# Patient Record
Sex: Male | Born: 1945 | Race: Black or African American | Hispanic: No | Smoking: Current every day smoker
Health system: Southern US, Community
[De-identification: ages and names within clinical notes are randomized; demographics above are authoritative.]

## PROBLEM LIST (undated history)

## (undated) DIAGNOSIS — M545 Low back pain, unspecified: Secondary | ICD-10-CM

## (undated) DIAGNOSIS — N400 Enlarged prostate without lower urinary tract symptoms: Secondary | ICD-10-CM

## (undated) DIAGNOSIS — K635 Polyp of colon: Secondary | ICD-10-CM

## (undated) DIAGNOSIS — N529 Male erectile dysfunction, unspecified: Secondary | ICD-10-CM

## (undated) DIAGNOSIS — E119 Type 2 diabetes mellitus without complications: Secondary | ICD-10-CM

## (undated) DIAGNOSIS — G8929 Other chronic pain: Secondary | ICD-10-CM

## (undated) DIAGNOSIS — R809 Proteinuria, unspecified: Secondary | ICD-10-CM

## (undated) DIAGNOSIS — H269 Unspecified cataract: Secondary | ICD-10-CM

## (undated) DIAGNOSIS — K5790 Diverticulosis of intestine, part unspecified, without perforation or abscess without bleeding: Secondary | ICD-10-CM

## (undated) DIAGNOSIS — D472 Monoclonal gammopathy: Secondary | ICD-10-CM

## (undated) DIAGNOSIS — I10 Essential (primary) hypertension: Secondary | ICD-10-CM

## (undated) HISTORY — DX: Other chronic pain: G89.29

## (undated) HISTORY — DX: Male erectile dysfunction, unspecified: N52.9

## (undated) HISTORY — DX: Proteinuria, unspecified: R80.9

## (undated) HISTORY — DX: Low back pain: M54.5

## (undated) HISTORY — DX: Benign prostatic hyperplasia without lower urinary tract symptoms: N40.0

## (undated) HISTORY — DX: Essential (primary) hypertension: I10

## (undated) HISTORY — DX: Low back pain, unspecified: M54.50

## (undated) HISTORY — DX: Polyp of colon: K63.5

## (undated) HISTORY — DX: Unspecified cataract: H26.9

## (undated) HISTORY — DX: Type 2 diabetes mellitus without complications: E11.9

## (undated) HISTORY — PX: TRANSURETHRAL RESECTION OF PROSTATE: SHX73

## (undated) HISTORY — DX: Monoclonal gammopathy: D47.2

## (undated) HISTORY — DX: Diverticulosis of intestine, part unspecified, without perforation or abscess without bleeding: K57.90

---

## 1988-01-30 DIAGNOSIS — I10 Essential (primary) hypertension: Secondary | ICD-10-CM

## 1988-01-30 HISTORY — DX: Essential (primary) hypertension: I10

## 2004-03-08 ENCOUNTER — Encounter (INDEPENDENT_AMBULATORY_CARE_PROVIDER_SITE_OTHER): Payer: Self-pay | Admitting: *Deleted

## 2004-03-08 ENCOUNTER — Observation Stay (HOSPITAL_COMMUNITY): Admission: RE | Admit: 2004-03-08 | Discharge: 2004-03-09 | Payer: Self-pay | Admitting: Urology

## 2008-04-01 ENCOUNTER — Encounter (INDEPENDENT_AMBULATORY_CARE_PROVIDER_SITE_OTHER): Payer: Self-pay | Admitting: *Deleted

## 2008-04-29 DIAGNOSIS — D472 Monoclonal gammopathy: Secondary | ICD-10-CM

## 2008-04-29 HISTORY — DX: Monoclonal gammopathy: D47.2

## 2008-05-03 ENCOUNTER — Encounter: Payer: Self-pay | Admitting: Gastroenterology

## 2008-05-17 ENCOUNTER — Encounter: Payer: Self-pay | Admitting: Gastroenterology

## 2008-05-26 ENCOUNTER — Ambulatory Visit: Payer: Self-pay | Admitting: Gastroenterology

## 2008-06-01 ENCOUNTER — Ambulatory Visit: Payer: Self-pay | Admitting: Oncology

## 2008-06-10 ENCOUNTER — Encounter: Payer: Self-pay | Admitting: Gastroenterology

## 2008-06-10 ENCOUNTER — Ambulatory Visit: Payer: Self-pay | Admitting: Gastroenterology

## 2008-06-11 ENCOUNTER — Encounter: Payer: Self-pay | Admitting: Gastroenterology

## 2008-06-14 ENCOUNTER — Encounter: Payer: Self-pay | Admitting: Gastroenterology

## 2008-06-14 LAB — CBC WITH DIFFERENTIAL/PLATELET
BASO%: 0.6 % (ref 0.0–2.0)
EOS%: 0.8 % (ref 0.0–7.0)
MCH: 29.2 pg (ref 27.2–33.4)
MCHC: 33.1 g/dL (ref 32.0–36.0)
MONO#: 0.4 10*3/uL (ref 0.1–0.9)
NEUT%: 48.7 % (ref 39.0–75.0)
RBC: 5.21 10*6/uL (ref 4.20–5.82)
RDW: 12.8 % (ref 11.0–14.6)
WBC: 5.8 10*3/uL (ref 4.0–10.3)
lymph#: 2.5 10*3/uL (ref 0.9–3.3)

## 2008-06-14 LAB — COMPREHENSIVE METABOLIC PANEL
ALT: 36 U/L (ref 0–53)
AST: 42 U/L — ABNORMAL HIGH (ref 0–37)
Calcium: 9.2 mg/dL (ref 8.4–10.5)
Chloride: 102 mEq/L (ref 96–112)
Creatinine, Ser: 0.73 mg/dL (ref 0.40–1.50)
Potassium: 3.5 mEq/L (ref 3.5–5.3)
Sodium: 137 mEq/L (ref 135–145)
Total Protein: 8.3 g/dL (ref 6.0–8.3)

## 2008-06-16 LAB — IMMUNOFIXATION ELECTROPHORESIS: IgG (Immunoglobin G), Serum: 1730 mg/dL — ABNORMAL HIGH (ref 694–1618)

## 2008-06-16 LAB — KAPPA/LAMBDA LIGHT CHAINS
Kappa free light chain: 1.66 mg/dL (ref 0.33–1.94)
Kappa:Lambda Ratio: 0.8 (ref 0.26–1.65)

## 2008-06-16 LAB — BETA 2 MICROGLOBULIN, SERUM: Beta-2 Microglobulin: 2.25 mg/L — ABNORMAL HIGH (ref 1.01–1.73)

## 2008-06-18 LAB — CREATININE CLEARANCE, URINE, 24 HOUR
Collection Interval-CRCL: 24 hours
Creatinine, Urine: 144.3 mg/dL
Creatinine: 0.72 mg/dL (ref 0.40–1.50)

## 2008-06-18 LAB — UIFE/LIGHT CHAINS/TP QN, 24-HR UR
Albumin, U: DETECTED
Alpha 2, Urine: DETECTED — AB
Beta, Urine: DETECTED — AB
Total Protein, Urine-Ur/day: 168 mg/d — ABNORMAL HIGH (ref 10–140)

## 2008-07-01 ENCOUNTER — Ambulatory Visit: Payer: Self-pay | Admitting: Gastroenterology

## 2008-07-01 DIAGNOSIS — D126 Benign neoplasm of colon, unspecified: Secondary | ICD-10-CM

## 2008-08-11 ENCOUNTER — Ambulatory Visit: Payer: Self-pay | Admitting: Oncology

## 2008-08-13 ENCOUNTER — Telehealth: Payer: Self-pay | Admitting: Gastroenterology

## 2008-08-13 LAB — CBC WITH DIFFERENTIAL/PLATELET
Eosinophils Absolute: 0 10*3/uL (ref 0.0–0.5)
HCT: 43 % (ref 38.4–49.9)
LYMPH%: 45.5 % (ref 14.0–49.0)
MCHC: 33.7 g/dL (ref 32.0–36.0)
MCV: 87.2 fL (ref 79.3–98.0)
MONO#: 0.5 10*3/uL (ref 0.1–0.9)
NEUT%: 43.9 % (ref 39.0–75.0)
Platelets: 246 10*3/uL (ref 140–400)
WBC: 5.4 10*3/uL (ref 4.0–10.3)

## 2008-08-13 LAB — IGG, IGA, IGM
IgA: 304 mg/dL (ref 68–378)
IgG (Immunoglobin G), Serum: 1700 mg/dL — ABNORMAL HIGH (ref 694–1618)
IgM, Serum: 73 mg/dL (ref 60–263)

## 2008-08-13 LAB — COMPREHENSIVE METABOLIC PANEL
BUN: 13 mg/dL (ref 6–23)
CO2: 22 mEq/L (ref 19–32)
Calcium: 9.8 mg/dL (ref 8.4–10.5)
Chloride: 106 mEq/L (ref 96–112)
Creatinine, Ser: 0.69 mg/dL (ref 0.40–1.50)

## 2008-08-18 ENCOUNTER — Encounter: Payer: Self-pay | Admitting: Gastroenterology

## 2008-08-18 ENCOUNTER — Ambulatory Visit (HOSPITAL_COMMUNITY): Admission: RE | Admit: 2008-08-18 | Discharge: 2008-08-18 | Payer: Self-pay | Admitting: Gastroenterology

## 2008-08-20 ENCOUNTER — Encounter: Payer: Self-pay | Admitting: Gastroenterology

## 2008-08-23 ENCOUNTER — Ambulatory Visit: Payer: Self-pay | Admitting: Gastroenterology

## 2008-10-20 ENCOUNTER — Telehealth: Payer: Self-pay | Admitting: Gastroenterology

## 2008-11-02 ENCOUNTER — Ambulatory Visit: Payer: Self-pay | Admitting: Gastroenterology

## 2008-11-09 ENCOUNTER — Encounter: Payer: Self-pay | Admitting: Gastroenterology

## 2008-11-09 ENCOUNTER — Ambulatory Visit (HOSPITAL_COMMUNITY): Admission: RE | Admit: 2008-11-09 | Discharge: 2008-11-09 | Payer: Self-pay | Admitting: Gastroenterology

## 2008-11-09 ENCOUNTER — Ambulatory Visit: Payer: Self-pay | Admitting: Gastroenterology

## 2008-11-11 ENCOUNTER — Ambulatory Visit: Payer: Self-pay | Admitting: Oncology

## 2008-11-11 ENCOUNTER — Encounter: Payer: Self-pay | Admitting: Gastroenterology

## 2008-11-15 LAB — CBC WITH DIFFERENTIAL/PLATELET
BASO%: 0.7 % (ref 0.0–2.0)
Eosinophils Absolute: 0 10*3/uL (ref 0.0–0.5)
LYMPH%: 48.6 % (ref 14.0–49.0)
MCHC: 33.9 g/dL (ref 32.0–36.0)
MONO#: 0.4 10*3/uL (ref 0.1–0.9)
NEUT#: 1.6 10*3/uL (ref 1.5–6.5)
RBC: 4.8 10*6/uL (ref 4.20–5.82)
RDW: 12.9 % (ref 11.0–14.6)
WBC: 4 10*3/uL (ref 4.0–10.3)
lymph#: 2 10*3/uL (ref 0.9–3.3)

## 2008-11-15 LAB — COMPREHENSIVE METABOLIC PANEL
ALT: 17 U/L (ref 0–53)
AST: 20 U/L (ref 0–37)
Calcium: 8.8 mg/dL (ref 8.4–10.5)
Chloride: 104 mEq/L (ref 96–112)
Creatinine, Ser: 0.65 mg/dL (ref 0.40–1.50)
Potassium: 3.8 mEq/L (ref 3.5–5.3)
Sodium: 138 mEq/L (ref 135–145)
Total Protein: 7.6 g/dL (ref 6.0–8.3)

## 2008-11-15 LAB — LACTATE DEHYDROGENASE: LDH: 124 U/L (ref 94–250)

## 2009-03-16 ENCOUNTER — Ambulatory Visit: Payer: Self-pay | Admitting: Oncology

## 2009-03-18 LAB — CBC WITH DIFFERENTIAL/PLATELET
Basophils Absolute: 0 10*3/uL (ref 0.0–0.1)
Eosinophils Absolute: 0 10*3/uL (ref 0.0–0.5)
HCT: 43.7 % (ref 38.4–49.9)
HGB: 14.6 g/dL (ref 13.0–17.1)
LYMPH%: 52.3 % — ABNORMAL HIGH (ref 14.0–49.0)
MCHC: 33.4 g/dL (ref 32.0–36.0)
MONO#: 0.4 10*3/uL (ref 0.1–0.9)
NEUT#: 1.4 10*3/uL — ABNORMAL LOW (ref 1.5–6.5)
NEUT%: 35.5 % — ABNORMAL LOW (ref 39.0–75.0)
Platelets: 228 10*3/uL (ref 140–400)
WBC: 3.9 10*3/uL — ABNORMAL LOW (ref 4.0–10.3)
lymph#: 2.1 10*3/uL (ref 0.9–3.3)

## 2009-03-18 LAB — IGG, IGA, IGM
IgG (Immunoglobin G), Serum: 1580 mg/dL (ref 694–1618)
IgM, Serum: 80 mg/dL (ref 60–263)

## 2009-03-18 LAB — COMPREHENSIVE METABOLIC PANEL
BUN: 12 mg/dL (ref 6–23)
CO2: 27 mEq/L (ref 19–32)
Calcium: 9.1 mg/dL (ref 8.4–10.5)
Chloride: 104 mEq/L (ref 96–112)
Creatinine, Ser: 0.77 mg/dL (ref 0.40–1.50)
Glucose, Bld: 115 mg/dL — ABNORMAL HIGH (ref 70–99)
Total Bilirubin: 0.4 mg/dL (ref 0.3–1.2)

## 2009-03-18 LAB — LACTATE DEHYDROGENASE: LDH: 136 U/L (ref 94–250)

## 2009-09-23 ENCOUNTER — Ambulatory Visit: Payer: Self-pay | Admitting: Oncology

## 2009-09-27 LAB — CBC WITH DIFFERENTIAL/PLATELET
BASO%: 1 % (ref 0.0–2.0)
EOS%: 0.8 % (ref 0.0–7.0)
LYMPH%: 45 % (ref 14.0–49.0)
MCHC: 33.5 g/dL (ref 32.0–36.0)
MONO#: 0.4 10*3/uL (ref 0.1–0.9)
Platelets: 228 10*3/uL (ref 140–400)
RBC: 4.79 10*6/uL (ref 4.20–5.82)
WBC: 4.1 10*3/uL (ref 4.0–10.3)
lymph#: 1.9 10*3/uL (ref 0.9–3.3)

## 2009-09-28 LAB — COMPREHENSIVE METABOLIC PANEL
ALT: 29 U/L (ref 0–53)
AST: 32 U/L (ref 0–37)
Alkaline Phosphatase: 65 U/L (ref 39–117)
Calcium: 8.9 mg/dL (ref 8.4–10.5)
Chloride: 104 mEq/L (ref 96–112)
Creatinine, Ser: 0.7 mg/dL (ref 0.40–1.50)

## 2009-09-28 LAB — LACTATE DEHYDROGENASE: LDH: 153 U/L (ref 94–250)

## 2010-03-28 ENCOUNTER — Other Ambulatory Visit (HOSPITAL_COMMUNITY): Payer: Self-pay | Admitting: Oncology

## 2010-03-28 ENCOUNTER — Encounter (HOSPITAL_BASED_OUTPATIENT_CLINIC_OR_DEPARTMENT_OTHER): Payer: MEDICARE | Admitting: Oncology

## 2010-03-28 DIAGNOSIS — D472 Monoclonal gammopathy: Secondary | ICD-10-CM

## 2010-03-28 LAB — COMPREHENSIVE METABOLIC PANEL
ALT: 35 U/L (ref 0–53)
AST: 38 U/L — ABNORMAL HIGH (ref 0–37)
CO2: 22 mEq/L (ref 19–32)
Creatinine, Ser: 0.73 mg/dL (ref 0.40–1.50)
Sodium: 135 mEq/L (ref 135–145)
Total Bilirubin: 0.5 mg/dL (ref 0.3–1.2)
Total Protein: 7.6 g/dL (ref 6.0–8.3)

## 2010-03-28 LAB — CBC WITH DIFFERENTIAL/PLATELET
BASO%: 0.7 % (ref 0.0–2.0)
EOS%: 0.6 % (ref 0.0–7.0)
HCT: 42.3 % (ref 38.4–49.9)
LYMPH%: 46.9 % (ref 14.0–49.0)
MCH: 29.2 pg (ref 27.2–33.4)
MCHC: 33.7 g/dL (ref 32.0–36.0)
MONO#: 0.3 10*3/uL (ref 0.1–0.9)
NEUT%: 43.2 % (ref 39.0–75.0)
Platelets: 217 10*3/uL (ref 140–400)
RBC: 4.88 10*6/uL (ref 4.20–5.82)
WBC: 3.9 10*3/uL — ABNORMAL LOW (ref 4.0–10.3)
lymph#: 1.8 10*3/uL (ref 0.9–3.3)

## 2010-03-28 LAB — IGG, IGA, IGM: IgM, Serum: 68 mg/dL (ref 60–263)

## 2010-05-07 LAB — GLUCOSE, CAPILLARY: Glucose-Capillary: 122 mg/dL — ABNORMAL HIGH (ref 70–99)

## 2010-05-09 LAB — GLUCOSE, CAPILLARY
Glucose-Capillary: 112 mg/dL — ABNORMAL HIGH (ref 70–99)
Glucose-Capillary: 130 mg/dL — ABNORMAL HIGH (ref 70–99)

## 2010-06-16 NOTE — Op Note (Signed)
NAME:  Gregg Flores, Gregg Flores NO.:  [id number]   MEDICAL RECORD NO.:  [id number]          PATIENT TYPE:  OBV   LOCATION:  0355                         FACILITY:  [facility]   PHYSICIAN:  Valetta Fuller, M.D.  DATE OF BIRTH:  Jan 21, 1946   DATE OF PROCEDURE:  03/09/2004  DATE OF DISCHARGE:                                 OPERATIVE REPORT   PREOPERATIVE DIAGNOSES:  1.  Urinary retention.  2.  Benign prostatic hypertrophy and bladder neck obstruction.  3.  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   POSTOPERATIVE DIAGNOSES:  1.  Urinary retention.  2.  Benign prostatic hypertrophy and bladder neck obstruction.  3.  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   PROCEDURE PERFORMED:  Transurethral resection of the prostate and suprapubic  tube placement.   SURGEON:  Valetta Fuller, M.D.   ANESTHESIA:  General.   INDICATIONS:  Gregg Flores is a 65 year old male who presented approximately  two months ago with severe voiding symptoms and a previous episode of  urinary retention.  When we saw him, he was complaining of urination every  30-40 minutes. We felt that he probably had a overflow situation and indeed  an ultrasound suggested a 1500-2000 cc postvoid residual within his bladder.  The patient underwent a catheter placement with 1600 cc of clear urine.  Renal function turned out to be normal.  Subsequent renal ultrasound showed  no abnormalities. The patient was started on medical therapy and failed  voiding trial with a repeat urinary retention of over 1000 cc of urine. The  patient also had some urodynamics and he was unable to really generate a  strong detrusor contraction. Cystoscopy revealed some outlet obstruction. We  thought we were dealing potentially with longstanding outlet obstruction  with resulting decompensated atonic hypocontractile bladder. We discussed  several options with the patient. We felt that it would be not unreasonable  to consider TURP to try to  eliminate obstruction in hopes that he would  develop some recurrent bladder function and the ability to void. We would  also recommend concurrent suprapubic tube placed in case the patient was  still unable to urinate spontaneously. He appeared to understand the  advantages and disadvantages of this approach and full informed consent was  obtained.   DESCRIPTION OF PROCEDURE:  The patient was brought to the operating room  where he had successful induction of general anesthesia. He was given  systemic antibiotic therapy due to his chronic indwelling Foley catheter.  He was placed in lithotomy position and prepped, draped in the usual manner.  The continuous flow 28-French resectoscope was inserted. The prostate was  actually a little bit larger and more obstructing than and I had appreciated  with flexible cystoscopy in the office. The bladder cell showed some chronic  inflammation consistent with a catheter but otherwise appeared relatively  unremarkable. The patient appeared have a 40-50 grams prostate with a  moderate trilobar hyperplasia. We started his resection by taking down the  bladder neck to capsular fibers. We took down the lateral lobes from  anterior to posteriorly, left lobe then right lobe. There was again  considerably more tissue that I had originally appreciated. We resected  lateral lobes down to capsular fibers in the bladder neck region but he  still had some mid and apical tissue. At that point we had resected for  about 50-55 minutes and I felt that we needed to complete the resection. At  that point I would estimate that 30-40 grams of prostate had been resected.  The bladder neck area appeared wide open, although there still was some  apical and mid prostatic tissues. We felt that we had indeed eliminated the  obvious outlet obstruction, although it is possible he may need a staged  TURP if he continues not to void successfully and there is ongoing evidence  of  visual obstruction. The chips were all evacuated. Hemostasis was then  accomplished with the electrocautery and was really quite acceptable at the  completion of the procedure. I went ahead and used a Lowsley retractor to  place a suprapubic tube at the dome of the bladder after completion of the  TURP. This was then confirmed to be in good position with cystoscopy. The  suprapubic tube was anchored and a Foley catheter was placed for continuous  irrigation. The patient was brought to recovery room in stable condition.  His urine was actually crystal-clear that time.      DSG/MEDQ  D:  03/09/2004  T:  03/09/2004  Job:  [id number]

## 2010-09-28 ENCOUNTER — Encounter (HOSPITAL_BASED_OUTPATIENT_CLINIC_OR_DEPARTMENT_OTHER): Payer: Medicare Other | Admitting: Oncology

## 2010-09-28 ENCOUNTER — Other Ambulatory Visit (HOSPITAL_COMMUNITY): Payer: Self-pay | Admitting: Oncology

## 2010-09-28 DIAGNOSIS — D472 Monoclonal gammopathy: Secondary | ICD-10-CM

## 2010-09-28 LAB — CBC WITH DIFFERENTIAL/PLATELET
BASO%: 0.5 % (ref 0.0–2.0)
LYMPH%: 47 % (ref 14.0–49.0)
MCH: 29.3 pg (ref 27.2–33.4)
MCHC: 33.8 g/dL (ref 32.0–36.0)
MCV: 86.7 fL (ref 79.3–98.0)
MONO%: 10.5 % (ref 0.0–14.0)
Platelets: 244 10*3/uL (ref 140–400)
RBC: 5.17 10*6/uL (ref 4.20–5.82)

## 2010-09-29 LAB — COMPREHENSIVE METABOLIC PANEL
Albumin: 4.5 g/dL (ref 3.5–5.2)
BUN: 18 mg/dL (ref 6–23)
Calcium: 10.3 mg/dL (ref 8.4–10.5)
Chloride: 106 mEq/L (ref 96–112)
Creatinine, Ser: 0.88 mg/dL (ref 0.50–1.35)
Glucose, Bld: 116 mg/dL — ABNORMAL HIGH (ref 70–99)
Potassium: 4.3 mEq/L (ref 3.5–5.3)

## 2010-09-29 LAB — IGG, IGA, IGM
IgA: 316 mg/dL (ref 68–379)
IgG (Immunoglobin G), Serum: 1600 mg/dL (ref 650–1600)
IgM, Serum: 72 mg/dL (ref 41–251)

## 2010-11-17 LAB — HM DIABETES EYE EXAM

## 2010-12-19 LAB — HEPATIC FUNCTION PANEL
AST: 37 U/L (ref 14–40)
Alkaline Phosphatase: 41 U/L (ref 25–125)
Bilirubin, Total: 0.6 mg/dL

## 2010-12-19 LAB — BASIC METABOLIC PANEL
BUN: 17 mg/dL (ref 4–21)
Creatinine: 0.9 mg/dL (ref 0.6–1.3)
Potassium: 4.1 mmol/L (ref 3.4–5.3)

## 2010-12-19 LAB — LIPID PANEL: LDL Cholesterol: 92 mg/dL

## 2011-02-08 ENCOUNTER — Telehealth: Payer: Self-pay | Admitting: Oncology

## 2011-02-08 NOTE — Telephone Encounter (Signed)
appt info mailed to pt    aom

## 2011-02-14 ENCOUNTER — Encounter: Payer: Self-pay | Admitting: Physician Assistant

## 2011-02-14 DIAGNOSIS — N138 Other obstructive and reflux uropathy: Secondary | ICD-10-CM | POA: Insufficient documentation

## 2011-02-14 DIAGNOSIS — N5201 Erectile dysfunction due to arterial insufficiency: Secondary | ICD-10-CM | POA: Insufficient documentation

## 2011-02-14 DIAGNOSIS — K5790 Diverticulosis of intestine, part unspecified, without perforation or abscess without bleeding: Secondary | ICD-10-CM | POA: Insufficient documentation

## 2011-02-14 DIAGNOSIS — I10 Essential (primary) hypertension: Secondary | ICD-10-CM | POA: Insufficient documentation

## 2011-02-14 DIAGNOSIS — R809 Proteinuria, unspecified: Secondary | ICD-10-CM | POA: Insufficient documentation

## 2011-02-14 DIAGNOSIS — E1129 Type 2 diabetes mellitus with other diabetic kidney complication: Secondary | ICD-10-CM | POA: Insufficient documentation

## 2011-02-14 DIAGNOSIS — M545 Low back pain: Secondary | ICD-10-CM | POA: Insufficient documentation

## 2011-02-14 DIAGNOSIS — N401 Enlarged prostate with lower urinary tract symptoms: Secondary | ICD-10-CM | POA: Insufficient documentation

## 2011-02-14 DIAGNOSIS — D472 Monoclonal gammopathy: Secondary | ICD-10-CM | POA: Insufficient documentation

## 2011-02-14 DIAGNOSIS — H269 Unspecified cataract: Secondary | ICD-10-CM | POA: Insufficient documentation

## 2011-03-27 ENCOUNTER — Ambulatory Visit (INDEPENDENT_AMBULATORY_CARE_PROVIDER_SITE_OTHER): Payer: Medicare Other | Admitting: Physician Assistant

## 2011-03-27 ENCOUNTER — Encounter: Payer: Self-pay | Admitting: Physician Assistant

## 2011-03-27 DIAGNOSIS — I10 Essential (primary) hypertension: Secondary | ICD-10-CM

## 2011-03-27 DIAGNOSIS — R809 Proteinuria, unspecified: Secondary | ICD-10-CM

## 2011-03-27 DIAGNOSIS — E782 Mixed hyperlipidemia: Secondary | ICD-10-CM

## 2011-03-27 DIAGNOSIS — E119 Type 2 diabetes mellitus without complications: Secondary | ICD-10-CM

## 2011-03-27 DIAGNOSIS — Z79899 Other long term (current) drug therapy: Secondary | ICD-10-CM

## 2011-03-27 LAB — COMPREHENSIVE METABOLIC PANEL
AST: 43 U/L — ABNORMAL HIGH (ref 0–37)
Alkaline Phosphatase: 49 U/L (ref 39–117)
BUN: 19 mg/dL (ref 6–23)
Creat: 0.92 mg/dL (ref 0.50–1.35)
Total Bilirubin: 0.5 mg/dL (ref 0.3–1.2)

## 2011-03-27 LAB — LIPID PANEL
HDL: 26 mg/dL — ABNORMAL LOW (ref >39)
LDL Cholesterol: 83 mg/dL (ref 0–99)
Total CHOL/HDL Ratio: 5 Ratio
VLDL: 20 mg/dL (ref 0–40)

## 2011-03-27 MED ORDER — HYDROCHLOROTHIAZIDE 25 MG PO TABS: 25.0000 mg | ORAL_TABLET | ORAL | Status: DC

## 2011-03-27 MED ORDER — PRAVASTATIN SODIUM 40 MG PO TABS: 40.0000 mg | ORAL_TABLET | Freq: Every evening | ORAL | Status: DC

## 2011-03-27 MED ORDER — METFORMIN HCL 1000 MG PO TABS: 1000.0000 mg | ORAL_TABLET | Freq: Two times a day (BID) | ORAL | Status: DC

## 2011-03-27 MED ORDER — FENOFIBRATE 160 MG PO TABS: 160.0000 mg | ORAL_TABLET | Freq: Every day | ORAL | Status: DC

## 2011-03-27 NOTE — Assessment & Plan Note (Signed)
Controlled. Continue current treatment. 

## 2011-03-27 NOTE — Assessment & Plan Note (Signed)
Controlled.  Continue working on healthy eating and regular exercise.  Continue current treatment.

## 2011-03-27 NOTE — Progress Notes (Signed)
Subjective:    Patient ID: Gregg Flores, male    DOB: [birth year], 66 y.o.   MRN: [id number]  HPI Presents for Follow-up of DM type 2, HTN, and hyperlipidemia.  Feels good!  Sees DDS regularly-dentures-full set Sees eye doctor annually  Rare home glucose monitoring No hypoglycemic events-feels hungry as glucose gets low   Review of Systems No chest pain, SOB, HA, dizziness, vision change, N/V, diarrhea, dysuria, myalgias, arthralgias or rash.     Objective:   Physical Exam  Constitutional: He is oriented to person, place, and time. Vital signs are normal. He appears well-developed and well-nourished. No distress.  HENT:  Head: Normocephalic and atraumatic.  Right Ear: Hearing normal.  Left Ear: Hearing normal.  Eyes: EOM are normal. Pupils are equal, round, and reactive to light.  Neck: Normal range of motion. Neck supple. No thyromegaly present.  Cardiovascular: Normal rate, regular rhythm and normal heart sounds.   Pulses:      Radial pulses are 2+ on the right side, and 2+ on the left side.       Dorsalis pedis pulses are 2+ on the right side, and 2+ on the left side.       Posterior tibial pulses are 2+ on the right side, and 2+ on the left side.  Pulmonary/Chest: Effort normal and breath sounds normal.  Lymphadenopathy:       Head (right side): No tonsillar, no preauricular, no posterior auricular and no occipital adenopathy present.       Head (left side): No tonsillar, no preauricular, no posterior auricular and no occipital adenopathy present.    He has no cervical adenopathy.       Right: No supraclavicular adenopathy present.       Left: No supraclavicular adenopathy present.  Neurological: He is alert and oriented to person, place, and time. No sensory deficit.  Skin: Skin is warm, dry and intact. No rash noted. No cyanosis or erythema. Nails show no clubbing.  Psychiatric: He has a normal mood and affect.   See diabetic foot exam.  Results for orders placed  in visit on 03/27/11  COMPREHENSIVE METABOLIC PANEL      Component Value Range   Sodium 138  135 - 145 (mEq/L)   Potassium 4.4  3.5 - 5.3 (mEq/L)   Chloride 105  96 - 112 (mEq/L)   CO2 23  19 - 32 (mEq/L)   Glucose, Bld 111 (*) 70 - 99 (mg/dL)   BUN 19  6 - 23 (mg/dL)   Creat 6.96  2.95 - 2.84 (mg/dL)   Total Bilirubin 0.5  0.3 - 1.2 (mg/dL)   Alkaline Phosphatase 49  39 - 117 (U/L)   AST 43 (*) 0 - 37 (U/L)   ALT 35  0 - 53 (U/L)   Total Protein 7.7  6.0 - 8.3 (g/dL)   Albumin 4.4  3.5 - 5.2 (g/dL)   Calcium 9.8  8.4 - 13.2 (mg/dL)  LIPID PANEL      Component Value Range   Cholesterol 129  0 - 200 (mg/dL)   Triglycerides 440  <102 (mg/dL)   HDL 26 (*) >72 (mg/dL)   Total CHOL/HDL Ratio 5.0     VLDL 20  0 - 40 (mg/dL)   LDL Cholesterol 83  0 - 99 (mg/dL)  POCT GLYCOSYLATED HEMOGLOBIN (HGB A1C)      Component Value Range   Hemoglobin A1C 6.8    POCT CBG (FASTING - GLUCOSE)-MANUAL ENTRY  Component Value Range   Glucose Fasting, POC 122 (*) 70 - 99 (mg/dL)   CMET, lipids pending.     Assessment & Plan:

## 2011-03-27 NOTE — Patient Instructions (Signed)
Continue to see your eye doctor annually and your dentist twice each year. Continue working on making healthy eating choices and getting regular exercise.

## 2011-03-27 NOTE — Assessment & Plan Note (Signed)
Continue to efforts to maintain good BP and glucose control.

## 2011-03-28 ENCOUNTER — Encounter: Payer: Self-pay | Admitting: Physician Assistant

## 2011-03-29 ENCOUNTER — Other Ambulatory Visit (HOSPITAL_BASED_OUTPATIENT_CLINIC_OR_DEPARTMENT_OTHER): Payer: Medicare Other | Admitting: Lab

## 2011-03-29 DIAGNOSIS — D472 Monoclonal gammopathy: Secondary | ICD-10-CM

## 2011-03-29 LAB — CBC WITH DIFFERENTIAL/PLATELET
Basophils Absolute: 0.1 10*3/uL (ref 0.0–0.1)
Eosinophils Absolute: 0.1 10*3/uL (ref 0.0–0.5)
HCT: 42.3 % (ref 38.4–49.9)
LYMPH%: 48.5 % (ref 14.0–49.0)
MCV: 83.8 fL (ref 79.3–98.0)
MONO#: 0.4 10*3/uL (ref 0.1–0.9)
NEUT#: 2.1 10*3/uL (ref 1.5–6.5)
NEUT%: 41.7 % (ref 39.0–75.0)
Platelets: 230 10*3/uL (ref 140–400)
WBC: 5 10*3/uL (ref 4.0–10.3)

## 2011-03-30 ENCOUNTER — Encounter: Payer: Self-pay | Admitting: Physician Assistant

## 2011-03-30 LAB — COMPREHENSIVE METABOLIC PANEL
BUN: 14 mg/dL (ref 6–23)
CO2: 21 mEq/L (ref 19–32)
Creatinine, Ser: 1.27 mg/dL (ref 0.50–1.35)
Glucose, Bld: 107 mg/dL — ABNORMAL HIGH (ref 70–99)
Total Bilirubin: 0.4 mg/dL (ref 0.3–1.2)
Total Protein: 7.5 g/dL (ref 6.0–8.3)

## 2011-03-30 LAB — LACTATE DEHYDROGENASE: LDH: 137 U/L (ref 94–250)

## 2011-03-30 LAB — IGG, IGA, IGM
IgA: 351 mg/dL (ref 68–379)
IgG (Immunoglobin G), Serum: 2030 mg/dL — ABNORMAL HIGH (ref 650–1600)

## 2011-06-19 ENCOUNTER — Ambulatory Visit (INDEPENDENT_AMBULATORY_CARE_PROVIDER_SITE_OTHER): Payer: Medicare Other | Admitting: Physician Assistant

## 2011-06-19 ENCOUNTER — Encounter: Payer: Self-pay | Admitting: Physician Assistant

## 2011-06-19 VITALS — BP 142/82 | HR 56 | Temp 97.9°F | Resp 16 | Ht 69.5 in | Wt 237.8 lb

## 2011-06-19 DIAGNOSIS — R809 Proteinuria, unspecified: Secondary | ICD-10-CM

## 2011-06-19 DIAGNOSIS — E785 Hyperlipidemia, unspecified: Secondary | ICD-10-CM

## 2011-06-19 DIAGNOSIS — I10 Essential (primary) hypertension: Secondary | ICD-10-CM

## 2011-06-19 DIAGNOSIS — J309 Allergic rhinitis, unspecified: Secondary | ICD-10-CM

## 2011-06-19 DIAGNOSIS — E119 Type 2 diabetes mellitus without complications: Secondary | ICD-10-CM

## 2011-06-19 LAB — POCT GLYCOSYLATED HEMOGLOBIN (HGB A1C): Hemoglobin A1C: 6.9

## 2011-06-19 LAB — COMPREHENSIVE METABOLIC PANEL
BUN: 19 mg/dL (ref 6–23)
CO2: 23 mEq/L (ref 19–32)
Calcium: 9.3 mg/dL (ref 8.4–10.5)
Chloride: 107 mEq/L (ref 96–112)
Creat: 1.02 mg/dL (ref 0.50–1.35)
Total Bilirubin: 0.5 mg/dL (ref 0.3–1.2)

## 2011-06-19 MED ORDER — IPRATROPIUM BROMIDE 0.03 % NA SOLN: 2.0000 | Freq: Two times a day (BID) | NASAL | Status: DC

## 2011-06-19 MED ORDER — METFORMIN HCL 1000 MG PO TABS: 1000.0000 mg | ORAL_TABLET | Freq: Two times a day (BID) | ORAL | Status: DC

## 2011-06-19 MED ORDER — FENOFIBRATE 160 MG PO TABS: 160.0000 mg | ORAL_TABLET | Freq: Every day | ORAL | Status: DC

## 2011-06-19 MED ORDER — PRAVASTATIN SODIUM 40 MG PO TABS: 40.0000 mg | ORAL_TABLET | Freq: Every evening | ORAL | Status: DC

## 2011-06-19 MED ORDER — AMLODIPINE BESYLATE 10 MG PO TABS: 10.0000 mg | ORAL_TABLET | Freq: Every day | ORAL | Status: DC

## 2011-06-19 MED ORDER — BENAZEPRIL HCL 40 MG PO TABS: 40.0000 mg | ORAL_TABLET | Freq: Every day | ORAL | Status: DC

## 2011-06-19 MED ORDER — HYDROCHLOROTHIAZIDE 25 MG PO TABS: 25.0000 mg | ORAL_TABLET | ORAL | Status: DC

## 2011-06-19 NOTE — Progress Notes (Signed)
  Subjective:    Patient ID: Gregg Flores, male    DOB: February 08, 1945, 66 y.o.   MRN: [id number]  HPI  Presents for routine follow-up of HTN and DM type 2.  Not checking home glucose.  Dizziness with trying to quit smoking, so he continues.    Last ophthalmology visit 09/2010. Last dental visit some time ago.  Fully endentulous. Is in the process of scheduling follow-up for a new set of dentures.  Review of Systems No chest pain, SOB, HA, dizziness, vision change, N/V, diarrhea, dysuria, myalgias or rash. Left knee pain intermittently.     Objective:   Physical Exam  Vital signs noted. Well-developed, well nourished BM who is awake, alert and oriented, in NAD. HEENT: [place]/AT, PERRL, EOMI.  Sclera and conjunctiva are clear.  EAC are patent, TMs are normal in appearance. Nasal mucosa is pink and moist. OP is clear. Neck: supple, non-tender, no lymphadenopathy, thyromegaly. Heart: RRR, no murmur Lungs: CTA Abdomen: normo-active bowel sounds, supple, non-tender, no mass or organomegaly. Extremities: no cyanosis, clubbing or edema. Skin: warm and dry without rash.  Results for orders placed in visit on 06/19/11  GLUCOSE, POCT (MANUAL RESULT ENTRY)      Component Value Range   POC Glucose 131 (*) 70 - 99 (mg/dl)  POCT GLYCOSYLATED HEMOGLOBIN (HGB A1C)      Component Value Range   Hemoglobin A1C 6.9          Assessment & Plan:   1. Type II or unspecified type diabetes mellitus without mention of complication, not stated as uncontrolled  POCT glucose (manual entry), POCT glycosylated hemoglobin (Hb A1C), Comprehensive metabolic panel, metFORMIN (GLUCOPHAGE) 1000 MG tablet  2. HTN (hypertension)  Comprehensive metabolic panel, amLODipine (NORVASC) 10 MG tablet, benazepril (LOTENSIN) 40 MG tablet, hydrochlorothiazide (HYDRODIURIL) 25 MG tablet  3. Microalbuminuria  Microalbumin, urine  4. AR (allergic rhinitis)  ipratropium (ATROVENT) 0.03 % nasal spray  5. Hyperlipidemia   fenofibrate 160 MG tablet, pravastatin (PRAVACHOL) 40 MG tablet   Patient Instructions  Keep up the good work on quitting smoking! And keep up the good work with healthy eating and exercise!  If you knee starts bothering you more, we'll do some additional evaluation.   Re-evaluate in 3 months.

## 2011-06-19 NOTE — Patient Instructions (Addendum)
Keep up the good work on quitting smoking! And keep up the good work with healthy eating and exercise!  If you knee starts bothering you more, we'll do some additional evaluation.

## 2011-06-20 LAB — MICROALBUMIN, URINE: Microalb, Ur: 1.58 mg/dL (ref 0.00–1.89)

## 2011-06-21 ENCOUNTER — Encounter: Payer: Self-pay | Admitting: Physician Assistant

## 2011-09-25 ENCOUNTER — Ambulatory Visit (INDEPENDENT_AMBULATORY_CARE_PROVIDER_SITE_OTHER): Payer: Medicare Other | Admitting: Physician Assistant

## 2011-09-25 ENCOUNTER — Encounter: Payer: Self-pay | Admitting: Physician Assistant

## 2011-09-25 VITALS — BP 127/84 | HR 59 | Temp 97.8°F | Resp 16 | Ht 69.0 in | Wt 236.0 lb

## 2011-09-25 DIAGNOSIS — D472 Monoclonal gammopathy: Secondary | ICD-10-CM

## 2011-09-25 DIAGNOSIS — R809 Proteinuria, unspecified: Secondary | ICD-10-CM

## 2011-09-25 DIAGNOSIS — E782 Mixed hyperlipidemia: Secondary | ICD-10-CM

## 2011-09-25 DIAGNOSIS — I10 Essential (primary) hypertension: Secondary | ICD-10-CM

## 2011-09-25 DIAGNOSIS — E785 Hyperlipidemia, unspecified: Secondary | ICD-10-CM

## 2011-09-25 DIAGNOSIS — J309 Allergic rhinitis, unspecified: Secondary | ICD-10-CM

## 2011-09-25 DIAGNOSIS — Z79899 Other long term (current) drug therapy: Secondary | ICD-10-CM

## 2011-09-25 DIAGNOSIS — Z720 Tobacco use: Secondary | ICD-10-CM | POA: Insufficient documentation

## 2011-09-25 DIAGNOSIS — N400 Enlarged prostate without lower urinary tract symptoms: Secondary | ICD-10-CM

## 2011-09-25 DIAGNOSIS — E119 Type 2 diabetes mellitus without complications: Secondary | ICD-10-CM

## 2011-09-25 LAB — LIPID PANEL
Cholesterol: 153 mg/dL (ref 0–200)
HDL: 30 mg/dL — ABNORMAL LOW (ref >39)
Total CHOL/HDL Ratio: 5.1 Ratio
Triglycerides: 108 mg/dL (ref <150)

## 2011-09-25 LAB — COMPREHENSIVE METABOLIC PANEL
AST: 36 U/L (ref 0–37)
BUN: 18 mg/dL (ref 6–23)
Calcium: 9.6 mg/dL (ref 8.4–10.5)
Chloride: 104 mEq/L (ref 96–112)
Creat: 0.87 mg/dL (ref 0.50–1.35)
Glucose, Bld: 123 mg/dL — ABNORMAL HIGH (ref 70–99)

## 2011-09-25 LAB — GLUCOSE, POCT (MANUAL RESULT ENTRY): POC Glucose: 129 mg/dl — AB (ref 70–99)

## 2011-09-25 MED ORDER — METFORMIN HCL 1000 MG PO TABS: 1000.0000 mg | ORAL_TABLET | Freq: Two times a day (BID) | ORAL | Status: DC

## 2011-09-25 MED ORDER — BENAZEPRIL HCL 40 MG PO TABS: 40.0000 mg | ORAL_TABLET | Freq: Every day | ORAL | Status: DC

## 2011-09-25 MED ORDER — IPRATROPIUM BROMIDE 0.03 % NA SOLN: 2.0000 | Freq: Two times a day (BID) | NASAL | Status: DC

## 2011-09-25 MED ORDER — ASPIRIN 81 MG PO TABS: 81.0000 mg | ORAL_TABLET | Freq: Every day | ORAL | Status: DC

## 2011-09-25 MED ORDER — PRAVASTATIN SODIUM 40 MG PO TABS: 40.0000 mg | ORAL_TABLET | Freq: Every evening | ORAL | Status: DC

## 2011-09-25 MED ORDER — FENOFIBRATE 160 MG PO TABS: 160.0000 mg | ORAL_TABLET | Freq: Every day | ORAL | Status: DC

## 2011-09-25 MED ORDER — HYDROCHLOROTHIAZIDE 25 MG PO TABS: 25.0000 mg | ORAL_TABLET | ORAL | Status: DC

## 2011-09-25 MED ORDER — AMLODIPINE BESYLATE 10 MG PO TABS: 10.0000 mg | ORAL_TABLET | Freq: Every day | ORAL | Status: DC

## 2011-09-25 NOTE — Assessment & Plan Note (Signed)
Follow-up with Dr. Arline Asp this week as scheduled.

## 2011-09-25 NOTE — Progress Notes (Signed)
Subjective:    Patient ID: JEREMIAS BROYHILL, male    DOB: 04-18-1945, 66 y.o.   MRN: [id number]  HPI 66 y.o. year old male presents for evaluation of diabetes type 2.  Prior to Admission medications   Medication Sig Start Date End Date Taking? Authorizing Provider  amLODipine (NORVASC) 10 MG tablet Take 1 tablet (10 mg total) by mouth daily. 06/19/11  Yes Nyela Cortinas S Zephaniah Enyeart, PA-C  aspirin 81 MG tablet Take 81 mg by mouth daily.   Yes Historical Provider, MD  benazepril (LOTENSIN) 40 MG tablet Take 1 tablet (40 mg total) by mouth daily. 06/19/11  Yes Shawnique Mariotti S Masson Nalepa, PA-C  fenofibrate 160 MG tablet Take 1 tablet (160 mg total) by mouth daily. 06/19/11  Yes Kariana Wiles S Rhena Glace, PA-C  hydrochlorothiazide (HYDRODIURIL) 25 MG tablet Take 1 tablet (25 mg total) by mouth every morning. 06/19/11  Yes Hakim Minniefield S Loralei Radcliffe, PA-C  ipratropium (ATROVENT) 0.03 % nasal spray Place 2 sprays into the nose every 12 (twelve) hours. 06/19/11  Yes Kycen Spalla S Sharelle Burditt, PA-C  metFORMIN (GLUCOPHAGE) 1000 MG tablet Take 1 tablet (1,000 mg total) by mouth 2 (two) times daily with a meal. 06/19/11  Yes Daylen Lipsky S Chrisandra Wiemers, PA-C  pravastatin (PRAVACHOL) 40 MG tablet Take 1 tablet (40 mg total) by mouth every evening. 06/19/11  Yes Dariusz Brase S Maymie Brunke, PA-C    No Known Allergies  Checks home glucose occasionally. Post-prandial glucose 180 about 2 weeks ago. does experience symptoms with hypoglycemia. ("my stomach feels bad.") does perform daily foot exam. Last dental visit was about a year ago (edentuluous, uncompensated) Last eye exam was 10/2010.  Has appointment 10/2011. has received a pneumococcal vaccine. is current with influenza vaccination.  Past Medical History  Diagnosis Date  . Essential hypertension, benign 1990  . Chronic LBP   . Type II or unspecified type diabetes mellitus without mention of complication, not stated as uncontrolled   . Diverticulosis   . Colon polyps   . BPH (benign prostatic hyperplasia)   .  Monoclonal gammopathy 04/2008  . Cataracts, bilateral   . ED (erectile dysfunction)   . Microalbuminuria     Family History  Problem Relation Age of Onset  . Heart disease Mother     s/p CABG  . Hypertension Mother    Review of Systems Denies chest pain, shortness of breath, HA, dizziness, vision change, nausea, vomiting, diarrhea, constipation, melena, hematochezia, dysuria, increased urinary urgency or frequency, increased hunger or thirst, unintentional weight change, unexplained myalgias or arthralgias, rash.  Has been coughing, mostly in the morning.  Continues to work on smoking cessation, "Just about" quit.  "It's hard to do."    Objective:   Physical Exam  Blood pressure 127/84, pulse 59, temperature 97.8 F (36.6 C), temperature source Oral, resp. rate 16, height 5\' 9"  (1.753 m), weight 236 lb (107.049 kg), SpO2 98.00%. Body mass index is 34.85 kg/(m^2). Well-developed, well nourished BM who is awake, alert and oriented, in NAD. HEENT: [place]/AT, sclera and conjunctiva are clear.  Edentulous-uncompensated Neck: supple, non-tender, no lymphadenopathy, thyromegaly. Heart: RRR, no murmur Lungs: normal effort, CTA Extremities: no cyanosis, clubbing or edema. Skin: warm and dry without rash. See diabetic foot exam.  Results for orders placed in visit on 09/25/11  GLUCOSE, POCT (MANUAL RESULT ENTRY)      Component Value Range   POC Glucose 129 (*) 70 - 99 mg/dl  POCT GLYCOSYLATED HEMOGLOBIN (HGB A1C)      Component Value Range   Hemoglobin A1C 7.0  Assessment & Plan:  Follow-up in 3 months, CPE/AWV-S at that time.

## 2011-09-25 NOTE — Assessment & Plan Note (Signed)
Update urine microalbumin today.

## 2011-09-25 NOTE — Assessment & Plan Note (Signed)
Congratulated on cutting back thus far.  Encouraged to continue these efforts for cessation.

## 2011-09-25 NOTE — Assessment & Plan Note (Signed)
A1C 7.0% today.  Continue efforts for healthy eating and regular exercise.  Re-check 3 months.

## 2011-09-25 NOTE — Patient Instructions (Addendum)
Keep working on quitting smoking! Keep working on getting regular exercise and making healthy eating choices to keep your blood sugar in control!

## 2011-09-25 NOTE — Assessment & Plan Note (Signed)
Controlled on current regimen.  Recheck 3 months.

## 2011-09-26 ENCOUNTER — Encounter: Payer: Self-pay | Admitting: Physician Assistant

## 2011-09-26 LAB — MICROALBUMIN, URINE: Microalb, Ur: 2.22 mg/dL — ABNORMAL HIGH (ref 0.00–1.89)

## 2011-09-27 ENCOUNTER — Ambulatory Visit (HOSPITAL_BASED_OUTPATIENT_CLINIC_OR_DEPARTMENT_OTHER): Payer: Medicare Other | Admitting: Lab

## 2011-09-27 ENCOUNTER — Telehealth: Payer: Self-pay | Admitting: Oncology

## 2011-09-27 ENCOUNTER — Ambulatory Visit (HOSPITAL_BASED_OUTPATIENT_CLINIC_OR_DEPARTMENT_OTHER): Payer: Medicare Other | Admitting: Oncology

## 2011-09-27 ENCOUNTER — Encounter: Payer: Self-pay | Admitting: Oncology

## 2011-09-27 ENCOUNTER — Other Ambulatory Visit: Payer: Self-pay

## 2011-09-27 VITALS — BP 140/81 | HR 65 | Temp 97.6°F | Resp 18 | Ht 69.0 in | Wt 239.5 lb

## 2011-09-27 DIAGNOSIS — D472 Monoclonal gammopathy: Secondary | ICD-10-CM

## 2011-09-27 LAB — CBC WITH DIFFERENTIAL/PLATELET
BASO%: 0.8 % (ref 0.0–2.0)
EOS%: 1.5 % (ref 0.0–7.0)
LYMPH%: 45.2 % (ref 14.0–49.0)
MCH: 28.8 pg (ref 27.2–33.4)
MCHC: 34.6 g/dL (ref 32.0–36.0)
MCV: 83.1 fL (ref 79.3–98.0)
MONO%: 8.1 % (ref 0.0–14.0)
Platelets: 223 10*3/uL (ref 140–400)
RBC: 4.97 10*6/uL (ref 4.20–5.82)

## 2011-09-27 LAB — COMPREHENSIVE METABOLIC PANEL (CC13)
ALT: 34 U/L (ref 0–55)
AST: 42 U/L — ABNORMAL HIGH (ref 5–34)
Albumin: 3.9 g/dL (ref 3.5–5.0)
Alkaline Phosphatase: 63 U/L (ref 40–150)
Calcium: 10.3 mg/dL (ref 8.4–10.4)
Chloride: 104 mEq/L (ref 98–107)
Potassium: 4.1 mEq/L (ref 3.5–5.1)
Sodium: 140 mEq/L (ref 136–145)

## 2011-09-27 LAB — IGG, IGA, IGM: IgM, Serum: 60 mg/dL (ref 41–251)

## 2011-09-27 NOTE — Progress Notes (Signed)
This office note has been dictated.  #[id number]

## 2011-09-27 NOTE — Telephone Encounter (Signed)
gve the pt his march,aug 2014 appt calendar

## 2011-09-27 NOTE — Progress Notes (Signed)
CC:   Jonita Albee, M.D. Barbette Hair. Arlyce Dice, MD,FACG  PROBLEM LIST: 1. 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  24-hour urine collection in 2010 yielded 168 mg of     protein.  Urine immunofixation electrophoresis was equivocal.     Initial IgG level was 1730 with normal IgA and IgM.  The patient     has not had a metastatic bone survey or a bone marrow exam. 2. Hypertension. 3. Dyslipidemia. 4. Diabetes mellitus. 5. BPH status post TURP on 03/09/2004. 6. Colonic polyps with tubulovillous histology on the patient's     colonoscopy of 06/10/2008.  MEDICATIONS: 1. Norvasc 10 mg daily. 2. Aspirin 81 mg daily. 3. Lotensin 40 mg daily. 4. Fenofibrate 160 mg daily. 5. Hydrochlorothiazide 25 mg daily. 6. Atrovent nasal spray 2 sprays every 12 hours. 7. Metformin 1000 mg twice daily. 8. Pravachol 40 mg in the evening.  SMOKING HISTORY:  The patient has smoked a pack of cigarettes a day since age 66.  He is currently trying to cut down and stop smoking.  HISTORY:  Gregg Flores was seen today for followup of his IgG kappa monoclonal gammopathy first detected in April 2010.  The patient's condition remains stable.  He has had some chronic low back pain but I sense that this is not a problem for him.  He really denies any significant mental or medical issues over the last year.  He was last seen by [place] on 09/28/2010 and before that on 03/28/2010.  He has not had any surgery or hospitalizations.  He is due for colonoscopy and I have urged him to follow through with this.  Dr. Arlyce Dice had done his colonoscopy in the past.  The patient is on Medicare.  He likes to fish. He is retired.  PHYSICAL EXAMINATION:  General:  Gregg Flores is pleasant somewhat overweight gentleman weighing at 239.5 pounds, height 5 feet 9 inches, body surface area 2.3 sq m.  Vital Signs:  Blood pressure 140/81.  Other vital signs are normal.  HEENT:  There is no scleral  icterus.  Mouth and pharynx are benign.  He does have conjunctival injection.  No adenopathy palpable.  Heart/lungs:  Normal.  No skeletal tenderness or deformity. Abdomen:  Obese, nontender, with no organomegaly or masses palpable. Extremities:  No peripheral edema or clubbing.  Neurologic:  Normal.  LABORATORY DATA:  Today, white count 4.8, ANC 2.1, hemoglobin 14.3, hematocrit 41.3, platelets 223,000.  Chemistries today were normal except for a glucose of 142 and an AST of 42.  Total protein 7.7 and albumin 3.9.  Globulins were therefore 3.8.  Quantitative immunoglobulins are pending.  On 03/29/2011 the IgG level was 2030 which is increased from the normal range of 650 to 1600.  IgA and IgM were normal.  On 09/28/2010 IgG level was 1600 and on 09/27/2009 was 1750.  IMAGING STUDIES:  Chest x-ray, 2 view from 03/03/2004, showed no active disease.  The visualized skeleton was unremarkable.  PROCEDURES:  The patient's most recent colonoscopy was on 11/09/2008. He has a history of adenomatous colonic polyps and, in fact, had a sessile polyp in the ascending colon that was removed with biopsy showing a tubulovillous polyp.  IMPRESSION AND PLAN:  Gregg Flores continues to do well.  He will be turning this 66 on September 5th.  I have urged him to follow through with plans for colonoscopy.  He assured me that he will do this.  I have  also encouraged him to stop smoking.  The patient is making a conscious effort in this direction.  We will check quantitative immunoglobulins in 6 months and plan to see Gregg Flores again in 1 year at which time we will check CBC, chemistries, and quantitative immunoglobulins.  If it appears that his IgG level is increasing then we may want to do some additional studies which might include urine collection and immunofixation electrophoresis, metastatic bone survey, and bone marrow exam.    ______________________________ Samul Dada, M.D. DSM/MEDQ   D:  09/27/2011  T:  09/27/2011  Job:  [id number]

## 2011-11-21 ENCOUNTER — Encounter: Payer: Self-pay | Admitting: Gastroenterology

## 2011-12-19 ENCOUNTER — Encounter: Payer: Self-pay | Admitting: Physician Assistant

## 2012-01-01 ENCOUNTER — Encounter: Payer: Self-pay | Admitting: Physician Assistant

## 2012-01-01 ENCOUNTER — Ambulatory Visit (INDEPENDENT_AMBULATORY_CARE_PROVIDER_SITE_OTHER): Payer: Medicare Other | Admitting: Physician Assistant

## 2012-01-01 VITALS — BP 134/80 | HR 70 | Temp 98.3°F | Resp 16 | Ht 70.0 in | Wt 234.0 lb

## 2012-01-01 DIAGNOSIS — F17200 Nicotine dependence, unspecified, uncomplicated: Secondary | ICD-10-CM

## 2012-01-01 DIAGNOSIS — Z720 Tobacco use: Secondary | ICD-10-CM

## 2012-01-01 DIAGNOSIS — E785 Hyperlipidemia, unspecified: Secondary | ICD-10-CM

## 2012-01-01 DIAGNOSIS — J309 Allergic rhinitis, unspecified: Secondary | ICD-10-CM

## 2012-01-01 DIAGNOSIS — I10 Essential (primary) hypertension: Secondary | ICD-10-CM

## 2012-01-01 DIAGNOSIS — E119 Type 2 diabetes mellitus without complications: Secondary | ICD-10-CM

## 2012-01-01 DIAGNOSIS — R809 Proteinuria, unspecified: Secondary | ICD-10-CM

## 2012-01-01 LAB — COMPREHENSIVE METABOLIC PANEL
Albumin: 4.6 g/dL (ref 3.5–5.2)
Alkaline Phosphatase: 49 U/L (ref 39–117)
CO2: 23 mEq/L (ref 19–32)
Glucose, Bld: 112 mg/dL — ABNORMAL HIGH (ref 70–99)
Potassium: 4.4 mEq/L (ref 3.5–5.3)
Sodium: 138 mEq/L (ref 135–145)
Total Protein: 7.8 g/dL (ref 6.0–8.3)

## 2012-01-01 LAB — LIPID PANEL
LDL Cholesterol: 80 mg/dL (ref 0–99)
Triglycerides: 117 mg/dL (ref <150)

## 2012-01-01 MED ORDER — METFORMIN HCL 1000 MG PO TABS: 1000.0000 mg | ORAL_TABLET | Freq: Two times a day (BID) | ORAL | Status: DC

## 2012-01-01 MED ORDER — AMLODIPINE BESYLATE 10 MG PO TABS: 10.0000 mg | ORAL_TABLET | Freq: Every day | ORAL | Status: DC

## 2012-01-01 MED ORDER — HYDROCHLOROTHIAZIDE 25 MG PO TABS: 25.0000 mg | ORAL_TABLET | ORAL | Status: DC

## 2012-01-01 MED ORDER — PRAVASTATIN SODIUM 40 MG PO TABS: 40.0000 mg | ORAL_TABLET | Freq: Every evening | ORAL | Status: DC

## 2012-01-01 MED ORDER — IPRATROPIUM BROMIDE 0.03 % NA SOLN: 2.0000 | Freq: Two times a day (BID) | NASAL | Status: DC

## 2012-01-01 MED ORDER — FENOFIBRATE 160 MG PO TABS: 160.0000 mg | ORAL_TABLET | Freq: Every day | ORAL | Status: DC

## 2012-01-01 MED ORDER — BENAZEPRIL HCL 40 MG PO TABS: 40.0000 mg | ORAL_TABLET | Freq: Every day | ORAL | Status: DC

## 2012-01-01 NOTE — Progress Notes (Signed)
Subjective:    Patient ID: Gregg Flores, male    DOB: 1945/09/01, 66 y.o.   MRN: [id number]  HPI This 66 y.o. male presents for evaluation of:  Marland Kitchen Essential hypertension, benign  . Chronic LBP  . Type II or unspecified type diabetes mellitus without mention of complication, not stated as uncontrolled  . Diverticulosis  . BPH (benign prostatic hyperplasia)  . Monoclonal gammopathy  . Cataracts, bilateral  . ED (erectile dysfunction)  . Microalbuminuria  . History of tobacco abuse (He quit smoking since his last visit!)   Checks home glucose occasionally.  Fasting glucose 120, post-prandial glucose 200 about 3 weeks ago.  does experience symptoms with hypoglycemia. ("my stomach feels bad.")  does perform daily foot exam.  Last dental visit was about a year ago (edentuluous, compensated)  Last eye exam was 10/2011.  Is current with pneumococcal vaccine.  is current with influenza vaccination.  Past Medical History  Diagnosis Date  . Essential hypertension, benign 1990  . Chronic LBP   . Type II or unspecified type diabetes mellitus without mention of complication, not stated as uncontrolled   . Diverticulosis   . Colon polyps   . BPH (benign prostatic hyperplasia)   . Monoclonal gammopathy 04/2008  . Cataracts, bilateral   . ED (erectile dysfunction)   . Microalbuminuria     Past Surgical History  Procedure Date  . Transurethral resection of prostate     Prior to Admission medications   Medication Sig Start Date End Date Taking? Authorizing Provider  amLODipine (NORVASC) 10 MG tablet Take 1 tablet (10 mg total) by mouth daily. 09/25/11  Yes Charvez Voorhies S Kamyah Wilhelmsen, PA-C  aspirin 81 MG tablet Take 1 tablet (81 mg total) by mouth daily. 09/25/11  Yes Tessica Cupo S Tristin Gladman, PA-C  benazepril (LOTENSIN) 40 MG tablet Take 1 tablet (40 mg total) by mouth daily. 09/25/11  Yes Niranjan Rufener S Alekzander Cardell, PA-C  fenofibrate 160 MG tablet Take 1 tablet (160 mg total) by mouth daily. 09/25/11  Yes Lyvia Mondesir  S Darrel Gloss, PA-C  hydrochlorothiazide (HYDRODIURIL) 25 MG tablet Take 1 tablet (25 mg total) by mouth every morning. 09/25/11  Yes Sharisse Rantz S Krishiv Sandler, PA-C  ipratropium (ATROVENT) 0.03 % nasal spray Place 2 sprays into the nose every 12 (twelve) hours. 09/25/11  Yes Raley Novicki S Henny Strauch, PA-C  metFORMIN (GLUCOPHAGE) 1000 MG tablet Take 1 tablet (1,000 mg total) by mouth 2 (two) times daily with a meal. 09/25/11  Yes Earnesteen Birnie S Doloris Servantes, PA-C  pravastatin (PRAVACHOL) 40 MG tablet Take 1 tablet (40 mg total) by mouth every evening. 09/25/11  Yes Shaunita Seney S Masiyah Jorstad, PA-C    No Known Allergies  History   Social History  . Marital Status: Single    Spouse Name: N/A    Number of Children: N/A  . Years of Education: N/A   Occupational History  . Not on file.   Social History Main Topics  . Smoking status: Former Smoker -- 0.5 packs/day for 43 years    Types: Cigarettes    Quit date: 09/24/2011  . Smokeless tobacco: Never Used  . Alcohol Use: 0.0 - 0.5 oz/week    0-1 drink(s) per week     Comment: occasionally, wine  . Drug Use: No  . Sexually Active: Not on file   Family History  Problem Relation Age of Onset  . Heart disease Mother     s/p CABG  . Hypertension Mother     Review of Systems Denies chest pain, shortness of breath,  HA, dizziness, vision change, nausea, vomiting, diarrhea, constipation, melena, hematochezia, dysuria, increased urinary urgency or frequency, increased hunger or thirst, unintentional weight change, unexplained myalgias or arthralgias, rash.     Objective:   Physical Exam  Blood pressure 134/80, pulse 70, temperature 98.3 F (36.8 C), resp. rate 16, height 5\' 10"  (1.778 m), weight 234 lb (106.142 kg). Body mass index is 33.58 kg/(m^2). Well-developed, well nourished BM who is awake, alert and oriented, in NAD. HEENT: [place]/AT, PERRL, EOMI.  Sclera and conjunctiva are clear.  EAC are patent, TMs are normal in appearance. Nasal mucosa is pink and moist. OP is clear  (fully compensated edentula). Neck: supple, non-tender, no lymphadenopathy, thyromegaly. Heart: RRR, no murmur Lungs: normal effort, CTA Extremities: no cyanosis, clubbing or edema. See diabetic foot exam. Skin: warm and dry without rash. Psychologic: good mood and appropriate affect, normal speech and behavior.  Results for orders placed in visit on 01/01/12  GLUCOSE, POCT (MANUAL RESULT ENTRY)      Component Value Range   POC Glucose 127 (*) 70 - 99 mg/dl  POCT GLYCOSYLATED HEMOGLOBIN (HGB A1C)      Component Value Range   Hemoglobin A1C 6.9    COMPREHENSIVE METABOLIC PANEL      Component Value Range   Sodium 138  135 - 145 mEq/L   Potassium 4.4  3.5 - 5.3 mEq/L   Chloride 105  96 - 112 mEq/L   CO2 23  19 - 32 mEq/L   Glucose, Bld 112 (*) 70 - 99 mg/dL   BUN 17  6 - 23 mg/dL   Creat 1.61  0.96 - 0.45 mg/dL   Total Bilirubin 0.6  0.3 - 1.2 mg/dL   Alkaline Phosphatase 49  39 - 117 U/L   AST 46 (*) 0 - 37 U/L   ALT 38  0 - 53 U/L   Total Protein 7.8  6.0 - 8.3 g/dL   Albumin 4.6  3.5 - 5.2 g/dL   Calcium 9.8  8.4 - 40.9 mg/dL  LIPID PANEL      Component Value Range   Cholesterol 131  0 - 200 mg/dL   Triglycerides 811  <914 mg/dL   HDL 28 (*) >78 mg/dL   Total CHOL/HDL Ratio 4.7     VLDL 23  0 - 40 mg/dL   LDL Cholesterol 80  0 - 99 mg/dL      Assessment & Plan:   1. Tobacco abuse  He QUIT! Congratulated and encouraged.  2. Type II or unspecified type diabetes mellitus without mention of complication, not stated as uncontrolled  POCT glucose (manual entry), POCT glycosylated hemoglobin (Hb A1C), Comprehensive metabolic panel, Lipid panel, Microalbumin, urine, metFORMIN (GLUCOPHAGE) 1000 MG tablet  3. Essential hypertension, benign  Controlled  4. Microalbuminuria  Microalbumin, urine  5. Hyperlipidemia  pravastatin (PRAVACHOL) 40 MG tablet, fenofibrate 160 MG tablet  6. AR (allergic rhinitis)  ipratropium (ATROVENT) 0.03 % nasal spray  7. HTN (hypertension)   hydrochlorothiazide (HYDRODIURIL) 25 MG tablet, benazepril (LOTENSIN) 40 MG tablet, amLODipine (NORVASC) 10 MG tablet   Continue current treatment.  RTC 3 months.

## 2012-01-04 ENCOUNTER — Encounter: Payer: Self-pay | Admitting: Physician Assistant

## 2012-03-31 ENCOUNTER — Other Ambulatory Visit (HOSPITAL_BASED_OUTPATIENT_CLINIC_OR_DEPARTMENT_OTHER): Payer: Medicare Other | Admitting: Lab

## 2012-03-31 DIAGNOSIS — D472 Monoclonal gammopathy: Secondary | ICD-10-CM

## 2012-03-31 LAB — IGG, IGA, IGM: IgG (Immunoglobin G), Serum: 1500 mg/dL (ref 650–1600)

## 2012-04-06 ENCOUNTER — Encounter: Payer: Self-pay | Admitting: Physician Assistant

## 2012-04-06 ENCOUNTER — Ambulatory Visit: Payer: Medicare Other

## 2012-04-06 ENCOUNTER — Ambulatory Visit (INDEPENDENT_AMBULATORY_CARE_PROVIDER_SITE_OTHER): Payer: Medicare Other | Admitting: Physician Assistant

## 2012-04-06 VITALS — BP 140/82 | HR 84 | Temp 98.2°F | Resp 18 | Ht 70.0 in | Wt 243.8 lb

## 2012-04-06 DIAGNOSIS — M25579 Pain in unspecified ankle and joints of unspecified foot: Secondary | ICD-10-CM

## 2012-04-06 DIAGNOSIS — E785 Hyperlipidemia, unspecified: Secondary | ICD-10-CM

## 2012-04-06 DIAGNOSIS — E119 Type 2 diabetes mellitus without complications: Secondary | ICD-10-CM

## 2012-04-06 MED ORDER — AMLODIPINE BESYLATE 10 MG PO TABS: 10.0000 mg | ORAL_TABLET | Freq: Every day | ORAL | Status: DC

## 2012-04-06 MED ORDER — BENAZEPRIL HCL 40 MG PO TABS: 40.0000 mg | ORAL_TABLET | Freq: Every day | ORAL | Status: DC

## 2012-04-06 MED ORDER — MELOXICAM 15 MG PO TABS: 15.0000 mg | ORAL_TABLET | Freq: Every day | ORAL | Status: DC

## 2012-04-06 MED ORDER — HYDROCHLOROTHIAZIDE 25 MG PO TABS: 25.0000 mg | ORAL_TABLET | ORAL | Status: DC

## 2012-04-06 MED ORDER — METFORMIN HCL 1000 MG PO TABS: 1000.0000 mg | ORAL_TABLET | Freq: Two times a day (BID) | ORAL | Status: DC

## 2012-04-06 MED ORDER — IPRATROPIUM BROMIDE 0.03 % NA SOLN: 2.0000 | Freq: Two times a day (BID) | NASAL | Status: DC

## 2012-04-06 MED ORDER — PRAVASTATIN SODIUM 40 MG PO TABS: 40.0000 mg | ORAL_TABLET | Freq: Every evening | ORAL | Status: DC

## 2012-04-06 MED ORDER — FENOFIBRATE 160 MG PO TABS: 160.0000 mg | ORAL_TABLET | Freq: Every day | ORAL | Status: DC

## 2012-04-06 MED ORDER — HYDROCODONE-ACETAMINOPHEN 5-325 MG PO TABS: 1.0000 | ORAL_TABLET | Freq: Every evening | ORAL | Status: DC | PRN

## 2012-04-06 NOTE — Progress Notes (Signed)
Subjective:    Patient ID: Gregg Flores, male    DOB: [birth year], 67 y.o.   MRN: [id number]  HPI  This 67 y.o. male presents for evaluation of LEFT ankle pain, and routine follow-up of his chronic medical problems. His ankle pain began 3-4 days ago after wearing a pair of shoes that he doesn't typically wear.  No specific trauma or injury.  Pain with weight bearing and walking. No swelling.  Aleve helped a little.  Patient Active Problem List  Diagnosis  . BENIGN NEOPLASM OF COLON  . Essential hypertension, benign  . Chronic LBP  . Type II or unspecified type diabetes mellitus without mention of complication, not stated as uncontrolled  . Diverticulosis  . BPH (benign prostatic hyperplasia)  . Monoclonal gammopathy  . Cataracts, bilateral  . ED (erectile dysfunction)  . Microalbuminuria  . History of tobacco abuse  . Hyperlipidemia LDL goal <70   Frequency of home glucose monitoring: once weekly; fasting 120's-130's Sees an eye specialist annually. Doesn't see a dentist, he is edentulous. Checks feet daily. Is current with influenza vaccine. Is current with pneumococcal vaccine.   Past Medical History  Diagnosis Date  . Essential hypertension, benign 1990  . Chronic LBP   . Type II or unspecified type diabetes mellitus without mention of complication, not stated as uncontrolled   . Diverticulosis   . Colon polyps   . BPH (benign prostatic hyperplasia)   . Monoclonal gammopathy 04/2008  . Cataracts, bilateral   . ED (erectile dysfunction)   . Microalbuminuria     Past Surgical History  Procedure Laterality Date  . Transurethral resection of prostate      Prior to Admission medications   Medication Sig Start Date End Date Taking? Authorizing Provider  amLODipine (NORVASC) 10 MG tablet Take 1 tablet (10 mg total) by mouth daily. 01/01/12  Yes Chelle S Jeffery, PA-C  aspirin 81 MG tablet Take 1 tablet (81 mg total) by mouth daily. 09/25/11  Yes Chelle S Jeffery, PA-C   benazepril (LOTENSIN) 40 MG tablet Take 1 tablet (40 mg total) by mouth daily. 01/01/12  Yes Chelle S Jeffery, PA-C  fenofibrate 160 MG tablet Take 1 tablet (160 mg total) by mouth daily. 01/01/12  Yes Chelle S Jeffery, PA-C  hydrochlorothiazide (HYDRODIURIL) 25 MG tablet Take 1 tablet (25 mg total) by mouth every morning. 01/01/12  Yes Chelle S Jeffery, PA-C  ipratropium (ATROVENT) 0.03 % nasal spray Place 2 sprays into the nose every 12 (twelve) hours. 01/01/12  Yes Chelle S Jeffery, PA-C  metFORMIN (GLUCOPHAGE) 1000 MG tablet Take 1 tablet (1,000 mg total) by mouth 2 (two) times daily with a meal. 01/01/12  Yes Chelle S Jeffery, PA-C  pravastatin (PRAVACHOL) 40 MG tablet Take 1 tablet (40 mg total) by mouth every evening. 01/01/12  Yes Chelle S Jeffery, PA-C    No Known Allergies  History   Social History  . Marital Status: Single    Spouse Name: N/A    Number of Children: N/A  . Years of Education: N/A   Occupational History  . Not on file.   Social History Main Topics  . Smoking status: Former Smoker -- 0.50 packs/day for 43 years    Types: Cigarettes    Quit date: 09/24/2011  . Smokeless tobacco: Never Used  . Alcohol Use: 0 - .5 oz/week    0-1 drink(s) per week     Comment: occasionally, wine  . Drug Use: No  . Sexually Active: Not on  file   Other Topics Concern  . Not on file   Social History Narrative  . No narrative on file    Family History  Problem Relation Age of Onset  . Heart disease Mother     s/p CABG  . Hypertension Mother     Review of Systems As above. Denies chest pain, shortness of breath, HA, dizziness, vision change, nausea, vomiting, diarrhea, constipation, melena, hematochezia, dysuria, increased urinary urgency or frequency, increased hunger or thirst, unintentional weight change, rash.     Objective:   Physical Exam Blood pressure 140/82, pulse 84, temperature 98.2 F (36.8 C), temperature source Oral, resp. rate 18, height 5\' 10"  (1.778  m), weight 243 lb 12.8 oz (110.587 kg), SpO2 98.00%. Body mass index is 34.98 kg/(m^2). Well-developed, well nourished BM who is awake, alert and oriented, in NAD. HEENT: [place]/AT, PERRL, EOMI.  Sclera and conjunctiva are clear.  EAC are patent, TMs are normal in appearance. Nasal mucosa is pink and moist. OP is clear. Neck: supple, non-tender, no lymphadenopathy, thyromegaly. Heart: RRR, no murmur Lungs: normal effort, CTA Abdomen: normo-active bowel sounds, supple, non-tender, no mass or organomegaly. Extremities: no cyanosis, clubbing or edema. Tenderness with deep palpation of the lateral malleolus and up the distal fibula several inches. Skin: warm and dry without rash. See DM foot exam. Psychologic: good mood and appropriate affect, normal speech and behavior.    Results for orders placed in visit on 04/06/12  GLUCOSE, POCT (MANUAL RESULT ENTRY)      Result Value Range   POC Glucose 148 (*) 70 - 99 mg/dl  POCT GLYCOSYLATED HEMOGLOBIN (HGB A1C)      Result Value Range   Hemoglobin A1C 7.3     LEFT ankle: [facility] reading (PRIMARY) by  Dr. Merla Riches.  Normal ankle.  No evidence of gout, fracture, other acute boney deformity.  Evidence of old injury of the mortis medially. Calcaneal spur.      Assessment & Plan:  Pain in joint, ankle and foot, left - Plan: DG Ankle Complete Left, meloxicam (MOBIC) 15 MG tablet, HYDROcodone-acetaminophen (NORCO) 5-325 MG per tablet  Type II or unspecified type diabetes mellitus without mention of complication, not stated as uncontrolled - Plan: POCT glucose (manual entry), POCT glycosylated hemoglobin (Hb A1C), Comprehensive metabolic panel, metFORMIN (GLUCOPHAGE) 1000 MG tablet, For home use only DME Glucometer  Microalbuminuria - Plan: Microalbumin, urine  Benign neoplasm of colon - Plan: Ambulatory referral to Gastroenterology  HTN (hypertension) - Plan: amLODipine (NORVASC) 10 MG tablet, benazepril (LOTENSIN) 40 MG tablet, hydrochlorothiazide  (HYDRODIURIL) 25 MG tablet  Hyperlipidemia - Plan: Lipid panel, fenofibrate 160 MG tablet, pravastatin (PRAVACHOL) 40 MG tablet  AR (allergic rhinitis) - Plan: ipratropium (ATROVENT) 0.03 % nasal spray  Need for hepatitis C screening test - Plan: Hepatitis C antibody

## 2012-04-06 NOTE — Patient Instructions (Addendum)
Use the meloxicam for pain.  Do not take Aleve or ibuprofen while you take this.  You MAY take acetaminophen (Tylenol) OR hydrocodone/APAP (Norco) with the meloxicam if needed for pain. If the pain continues, return for additional evaluation.  Otherwise, I'll see you in 3 months.

## 2012-04-07 ENCOUNTER — Encounter: Payer: Self-pay | Admitting: Gastroenterology

## 2012-04-07 LAB — COMPREHENSIVE METABOLIC PANEL
Albumin: 5 g/dL (ref 3.5–5.2)
BUN: 19 mg/dL (ref 6–23)
Calcium: 10.4 mg/dL (ref 8.4–10.5)
Chloride: 103 mEq/L (ref 96–112)
Glucose, Bld: 157 mg/dL — ABNORMAL HIGH (ref 70–99)
Potassium: 4.3 mEq/L (ref 3.5–5.3)
Total Protein: 8.5 g/dL — ABNORMAL HIGH (ref 6.0–8.3)

## 2012-04-07 LAB — LIPID PANEL
HDL: 36 mg/dL — ABNORMAL LOW (ref >39)
Triglycerides: 111 mg/dL (ref <150)

## 2012-04-07 NOTE — Progress Notes (Signed)
04/08/12 appt cancelled. Three month appt made for 6/3 at 7:45 with pt. Gregg Flores

## 2012-04-08 ENCOUNTER — Ambulatory Visit: Payer: Medicare Other | Admitting: Physician Assistant

## 2012-04-09 ENCOUNTER — Encounter: Payer: Self-pay | Admitting: Physician Assistant

## 2012-04-09 ENCOUNTER — Ambulatory Visit (INDEPENDENT_AMBULATORY_CARE_PROVIDER_SITE_OTHER): Payer: Medicare Other | Admitting: Emergency Medicine

## 2012-04-09 VITALS — BP 140/80 | HR 77 | Temp 98.0°F | Resp 16 | Ht 70.0 in | Wt 245.0 lb

## 2012-04-09 DIAGNOSIS — D472 Monoclonal gammopathy: Secondary | ICD-10-CM

## 2012-04-09 DIAGNOSIS — B1920 Unspecified viral hepatitis C without hepatic coma: Secondary | ICD-10-CM

## 2012-04-09 DIAGNOSIS — M25569 Pain in unspecified knee: Secondary | ICD-10-CM

## 2012-04-09 MED ORDER — CYCLOBENZAPRINE HCL 10 MG PO TABS: 10.0000 mg | ORAL_TABLET | Freq: Three times a day (TID) | ORAL | Status: DC | PRN

## 2012-04-09 NOTE — Patient Instructions (Signed)
You may only take ONE meloxicam each day.   You may take TWO of the Norco (hydrocodone) every 4-6 hours as needed for pain. You may take the cyclobenzaprine (Flexeril) every 8 hours, but it may cause sleepiness, so don't drive when you take it. Keep the leg elevated as much as you can. Limit the walking and standing you do.

## 2012-04-09 NOTE — Progress Notes (Signed)
Subjective:    Patient ID: Gregg Flores, male    DOB: 02-21-1945, 67 y.o.   MRN: [id number]  HPI  This 67 y.o. male presents for evaluation of LEFT leg pain.  Seen initially for this on 04/06/2012, when the pain was located in the lateral malleolar region.  Xrays were negative for boney deformities.  The pain is now higher up the leg, no longer in the ankle regional.  No swelling. Recall he has had no trauma/injury to the area.  Has taken 2 meloxicam and 2 Norco at a time, since one of each was ineffective. Rest and elevation helps.  Standing and walking exacerbate the pain.  He has controlled DM type 2, HTN, hyperlipidemia and a longstanding monoclonal gammopathy. His recent screening for hepatitis C was positive, and he needs additional PCR testing to confirm.  Past Medical History  Diagnosis Date  . Essential hypertension, benign 1990  . Chronic LBP   . Type II or unspecified type diabetes mellitus without mention of complication, not stated as uncontrolled   . Diverticulosis   . Colon polyps   . BPH (benign prostatic hyperplasia)   . Monoclonal gammopathy 04/2008  . Cataracts, bilateral   . ED (erectile dysfunction)   . Microalbuminuria     Past Surgical History  Procedure Laterality Date  . Transurethral resection of prostate      Prior to Admission medications   Medication Sig Start Date End Date Taking? Authorizing Provider  amLODipine (NORVASC) 10 MG tablet Take 1 tablet (10 mg total) by mouth daily. 04/06/12  Yes Rochelle Nephew S Lola Lofaro, PA-C  aspirin 81 MG tablet Take 1 tablet (81 mg total) by mouth daily. 09/25/11  Yes Miryam Mcelhinney S Lemont Sitzmann, PA-C  benazepril (LOTENSIN) 40 MG tablet Take 1 tablet (40 mg total) by mouth daily. 04/06/12  Yes Mikaeel Petrow S Kashis Penley, PA-C  fenofibrate 160 MG tablet Take 1 tablet (160 mg total) by mouth daily. 04/06/12  Yes Raneshia Derick S Cru Kritikos, PA-C  hydrochlorothiazide (HYDRODIURIL) 25 MG tablet Take 1 tablet (25 mg total) by mouth every morning. 04/06/12  Yes Cande Mastropietro  S Keonda Dow, PA-C  HYDROcodone-acetaminophen (NORCO) 5-325 MG per tablet Take 1 tablet by mouth at bedtime as needed for pain. 04/06/12  Yes Aaidyn San S Lynnann Knudsen, PA-C  ipratropium (ATROVENT) 0.03 % nasal spray Place 2 sprays into the nose every 12 (twelve) hours. 04/06/12  Yes Neri Samek S Saamiya Jeppsen, PA-C  meloxicam (MOBIC) 15 MG tablet Take 1 tablet (15 mg total) by mouth daily. Use as needed for pain.  Do not use with Aleve. 04/06/12  Yes Teanna Elem S Dovber Ernest, PA-C  metFORMIN (GLUCOPHAGE) 1000 MG tablet Take 1 tablet (1,000 mg total) by mouth 2 (two) times daily with a meal. 04/06/12  Yes Harriet Sutphen S Romaldo Saville, PA-C  pravastatin (PRAVACHOL) 40 MG tablet Take 1 tablet (40 mg total) by mouth every evening. 04/06/12  Yes Tiersa Dayley S Hettie Roselli, PA-C    No Known Allergies  History   Social History  . Marital Status: Divorced    Spouse Name: n/a    Number of Children: 3  . Years of Education: 10   Occupational History  . retired-[employer] [place]   Social History Main Topics  . Smoking status: Former Smoker -- 0.50 packs/day for 43 years    Types: Cigarettes    Quit date: 09/24/2011  . Smokeless tobacco: Never Used  . Alcohol Use: 0 - .5 oz/week    0-1 drink(s) per week  Comment: occasionally, wine  . Drug Use: No  . Sexually Active: Not on file   Other Topics Concern  . Not on file   Social History Narrative   Lives alone in a duplex.  His son lives on the other side.     Family History  Problem Relation Age of Onset  . Heart disease Mother     s/p CABG  . Hypertension Mother     Review of Systems As above.    Objective:   Physical Exam Blood pressure 140/80, pulse 77, temperature 98 F (36.7 C), temperature source Oral, resp. rate 16, height 5\' 10"  (1.778 m), weight 245 lb (111.131 kg), SpO2 98.00%. Body mass index is 35.15 kg/(m^2). Well-developed, well nourished BM who is awake, alert and oriented, in NAD. HEENT: [place]/AT, sclera and conjunctiva are clear.   Lungs: normal  effort Extremities: no cyanosis, clubbing or edema. Tenderness of the LEFT lateral calf.  No pain with ROM of the ankle, in the ankle or site of pain.  Cap refill <3 seconds.  Pedal pulses are symmetric. Skin: warm and dry without rash. Psychologic: good mood and appropriate affect, normal speech and behavior.      Assessment & Plan:  Pain in joint, lower leg, left - Plan: cyclobenzaprine (FLEXERIL) 10 MG tablet; continue meloxicam (but ONLY 1 EACH DAY) and Norco prn (he may take 2 Q4 hours as needed). Recheck in 2 days.  Hepatitis C - Plan: HCV RNA, PCR, Qualitative, Hepatitis c vrs RNA detect by PCR-qual  Discussed with Dr. Cleta Alberts.  Fernande Bras, PA-C Certified Physician Assistant  [facility]

## 2012-04-11 ENCOUNTER — Ambulatory Visit (INDEPENDENT_AMBULATORY_CARE_PROVIDER_SITE_OTHER): Payer: Medicare Other | Admitting: Physician Assistant

## 2012-04-11 VITALS — BP 136/82 | HR 84 | Temp 98.6°F | Resp 16 | Ht 70.0 in | Wt 246.0 lb

## 2012-04-11 MED ORDER — HYDROCODONE-ACETAMINOPHEN 5-325 MG PO TABS: 1.0000 | ORAL_TABLET | ORAL | Status: DC | PRN

## 2012-04-11 NOTE — Progress Notes (Signed)
  Subjective:    Patient ID: Gregg Flores, male    DOB: [birth year], 67 y.o.   MRN: [id number]  HPI This 67 y.o. male presents for evaluation of LEFT lower leg pain. He notes that the addition of cyclobenzaprine and increasing the Norco to 10 mg has helped tremendously and his pain is getting better and better.  At his visit last week he was screened for Hep C.  Initial testing was positive.  Confirmatory lab is pending.  Patient Active Problem List  Diagnosis  . BENIGN NEOPLASM OF COLON  . ABN MAT GLUCOSE TOLERANCE COMPL PG CB/PP UNS EOC  . Essential hypertension, benign  . Chronic LBP  . Type II or unspecified type diabetes mellitus without mention of complication, not stated as uncontrolled  . Diverticulosis  . BPH (benign prostatic hyperplasia)  . Monoclonal gammopathy  . Cataracts, bilateral  . ED (erectile dysfunction)  . Microalbuminuria  . History of tobacco abuse  . Hyperlipidemia LDL goal <70   No Known Allergies  Current Outpatient Prescriptions on File Prior to Visit  Medication Sig Dispense Refill  . amLODipine (NORVASC) 10 MG tablet Take 1 tablet (10 mg total) by mouth daily.  90 tablet  1  . aspirin 81 MG tablet Take 1 tablet (81 mg total) by mouth daily.  100 tablet  4  . benazepril (LOTENSIN) 40 MG tablet Take 1 tablet (40 mg total) by mouth daily.  90 tablet  1  . cyclobenzaprine (FLEXERIL) 10 MG tablet Take 1 tablet (10 mg total) by mouth 3 (three) times daily as needed for muscle spasms.  30 tablet  0  . fenofibrate 160 MG tablet Take 1 tablet (160 mg total) by mouth daily.  90 tablet  1  . hydrochlorothiazide (HYDRODIURIL) 25 MG tablet Take 1 tablet (25 mg total) by mouth every morning.  90 tablet  1  . HYDROcodone-acetaminophen (NORCO) 5-325 MG per tablet Take 1 tablet by mouth at bedtime as needed for pain.  15 tablet  0  . ipratropium (ATROVENT) 0.03 % nasal spray Place 2 sprays into the nose every 12 (twelve) hours.  90 mL  1  . meloxicam (MOBIC) 15 MG  tablet Take 1 tablet (15 mg total) by mouth daily. Use as needed for pain.  Do not use with Aleve.  30 tablet  0  . metFORMIN (GLUCOPHAGE) 1000 MG tablet Take 1 tablet (1,000 mg total) by mouth 2 (two) times daily with a meal.  180 tablet  1  . pravastatin (PRAVACHOL) 40 MG tablet Take 1 tablet (40 mg total) by mouth every evening.  90 tablet  1      Review of Systems No chest pain, SOB, HA, dizziness, vision change, N/V, diarrhea, constipation, dysuria, urinary urgency or frequency, rash.     Objective:   Physical Exam  BP 136/82  Pulse 84  Temp(Src) 98.6 F (37 C)  Resp 16  Ht 5\' 10"  (1.778 m)  Wt 246 lb (111.585 kg)  BMI 35.3 kg/m2 WDWNBM, A&O x 3. LEFT lower leg is normal on inspection.  Mild tenderness of the anterolateral leg, 3-4 inches below the knee.        Assessment & Plan:  Pain in joint, lower leg, left - Plan: HYDROcodone-acetaminophen (NORCO) 5-325 MG per tablet  Pain in joint, ankle and foot, left  Will refer to the Hepatitis C clinic once confirmatory results are available.  Patient advised.

## 2012-04-11 NOTE — Patient Instructions (Signed)
Continue the Mobic (meloxicam) ONE EACH DAY. Continue the hydrocodone (Norco) and the cyclobenzaprine (Flexeril) as needed.  If the pain continues in 1 week, return for additional evaluation. If the confirmatory test for hepatitis C is positive, I'll refer you to a specialist so they can make sure there isn't any other medication you need.

## 2012-04-15 ENCOUNTER — Other Ambulatory Visit: Payer: Self-pay | Admitting: Physician Assistant

## 2012-04-29 HISTORY — PX: COLONOSCOPY: SHX174

## 2012-05-01 ENCOUNTER — Ambulatory Visit (AMBULATORY_SURGERY_CENTER): Payer: Medicare Other

## 2012-05-01 ENCOUNTER — Encounter: Payer: Self-pay | Admitting: Gastroenterology

## 2012-05-01 VITALS — Ht 69.0 in | Wt 247.0 lb

## 2012-05-01 DIAGNOSIS — Z860100 Personal history of colon polyps, unspecified: Secondary | ICD-10-CM

## 2012-05-01 MED ORDER — NA SULFATE-K SULFATE-MG SULF 17.5-3.13-1.6 GM/177ML PO SOLN: 1.0000 | Freq: Once | ORAL | Status: DC

## 2012-05-15 ENCOUNTER — Encounter: Payer: Self-pay | Admitting: Gastroenterology

## 2012-05-15 ENCOUNTER — Other Ambulatory Visit: Payer: Self-pay | Admitting: Gastroenterology

## 2012-05-15 ENCOUNTER — Ambulatory Visit (AMBULATORY_SURGERY_CENTER): Payer: Medicare Other | Admitting: Gastroenterology

## 2012-05-15 VITALS — BP 107/57 | HR 63 | Temp 98.8°F | Resp 25 | Ht 69.0 in | Wt 247.0 lb

## 2012-05-15 DIAGNOSIS — Z86010 Personal history of colonic polyps: Secondary | ICD-10-CM

## 2012-05-15 DIAGNOSIS — D126 Benign neoplasm of colon, unspecified: Secondary | ICD-10-CM

## 2012-05-15 MED ORDER — SODIUM CHLORIDE 0.9 % IV SOLN: 500.0000 mL | INTRAVENOUS | Status: DC

## 2012-05-15 NOTE — Op Note (Signed)
[facility] [address] [place], [address]   COLONOSCOPY PROCEDURE REPORT  PATIENT: Gregg Flores, Gregg Flores  MR#: [id number] BIRTHDATE: 1945/05/20 , 66  yrs. old GENDER: Male ENDOSCOPIST: Louis Meckel, MD REFERRED [id number] Tinnie Gens, [place] PROCEDURE DATE:  05/15/2012 PROCEDURE:   Colonoscopy with cold biopsy polypectomy ASA CLASS:   Class II INDICATIONS:Patient's personal history of adenomatous colon polyps. sessile villous adenoma 2010 MEDICATIONS: MAC sedation, administered by CRNA and propofol (Diprivan) 250mg  IV  DESCRIPTION OF PROCEDURE:   After the risks benefits and alternatives of the procedure were thoroughly explained, informed consent was obtained.  A digital rectal exam revealed no abnormalities of the rectum.   The LB [id number]  endoscope was introduced through the anus and advanced to the cecum, which was identified by both the appendix and ileocecal valve. No adverse events experienced.   The quality of the prep was Suprep good  The instrument was then slowly withdrawn as the colon was fully examined.      COLON FINDINGS: A sessile polyp measuring 2 mm in size was found in the ascending colon.  A polypectomy was performed with cold forceps.  The resection was complete and the polyp tissue was completely retrieved.   The colon mucosa was otherwise normal. Retroflexed views revealed no abnormalities. The time to cecum=1 minutes 36 seconds.  Withdrawal time=10 minutes 25 seconds.  The scope was withdrawn and the procedure completed. COMPLICATIONS: There were no complications.  ENDOSCOPIC IMPRESSION: 1.   Sessile polyp measuring 2 mm in size was found in the ascending colon; polypectomy was performed with cold forceps 2.   The colon mucosa was otherwise normal  RECOMMENDATIONS: If the polyp(s) removed today are proven to be adenomatous (pre-cancerous) polyps, you will need a repeat colonoscopy in 5 years.  Otherwise you should  continue to follow colorectal cancer screening guidelines for "routine risk" patients with colonoscopy in 7 years.  You will receive a letter within 1-2 weeks with the results of your biopsy as well as final recommendations.  Please call my office if you have not received a letter after 3 weeks.   eSigned:  Louis Meckel, MD 05/15/2012 11:52 AM   cc:

## 2012-05-15 NOTE — Progress Notes (Signed)
Lidocaine-40mg IV prior to Propofol InductionPropofol given over incremental dosages 

## 2012-05-15 NOTE — Progress Notes (Signed)
Called to room to assist during endoscopic procedure.  Patient ID and intended procedure confirmed with present staff. Received instructions for my participation in the procedure from the performing physician. ewm 

## 2012-05-15 NOTE — Patient Instructions (Addendum)
Impressions/recommendations:  Polyps (handout given)  Repeat colonoscopy pending pathology results.  YOU HAD AN ENDOSCOPIC PROCEDURE TODAY AT THE [facility]: Refer to the procedure report that was given to you for any specific questions about what was found during the examination.  If the procedure report does not answer your questions, please call your gastroenterologist to clarify.  If you requested that your care partner not be given the details of your procedure findings, then the procedure report has been included in a sealed envelope for you to review at your convenience later.  YOU SHOULD EXPECT: Some feelings of bloating in the abdomen. Passage of more gas than usual.  Walking can help get rid of the air that was put into your GI tract during the procedure and reduce the bloating. If you had a lower endoscopy (such as a colonoscopy or flexible sigmoidoscopy) you may notice spotting of blood in your stool or on the toilet paper. If you underwent a bowel prep for your procedure, then you may not have a normal bowel movement for a few days.  DIET: Your first meal following the procedure should be a light meal and then it is ok to progress to your normal diet.  A half-sandwich or bowl of soup is an example of a good first meal.  Heavy or fried foods are harder to digest and may make you feel nauseous or bloated.  Likewise meals heavy in dairy and vegetables can cause extra gas to form and this can also increase the bloating.  Drink plenty of fluids but you should avoid alcoholic beverages for 24 hours.  ACTIVITY: Your care partner should take you home directly after the procedure.  You should plan to take it easy, moving slowly for the rest of the day.  You can resume normal activity the day after the procedure however you should NOT DRIVE or use heavy machinery for 24 hours (because of the sedation medicines used during the test).    SYMPTOMS TO REPORT IMMEDIATELY: A  gastroenterologist can be reached at any hour.  During normal business hours, 8:30 AM to 5:00 PM Monday through Friday, call [phone].  After hours and on weekends, please call the GI answering service at [phone] who will take a message and have the physician on call contact you.   Following lower endoscopy (colonoscopy or flexible sigmoidoscopy):  Excessive amounts of blood in the stool  Significant tenderness or worsening of abdominal pains  Swelling of the abdomen that is new, acute  Fever of 100F or higher   FOLLOW UP: If any biopsies were taken you will be contacted by phone or by letter within the next 1-3 weeks.  Call your gastroenterologist if you have not heard about the biopsies in 3 weeks.  Our staff will call the home number listed on your records the next business day following your procedure to check on you and address any questions or concerns that you may have at that time regarding the information given to you following your procedure. This is a courtesy call and so if there is no answer at the home number and we have not heard from you through the emergency physician on call, we will assume that you have returned to your regular daily activities without incident.  SIGNATURES/CONFIDENTIALITY: You and/or your care partner have signed paperwork which will be entered into your electronic medical record.  These signatures attest to the fact that that the information above on your After Visit Summary has   been reviewed and is understood.  Full responsibility of the confidentiality of this discharge information lies with you and/or your care-partner. 

## 2012-05-19 ENCOUNTER — Telehealth: Payer: Self-pay | Admitting: *Deleted

## 2012-05-19 NOTE — Telephone Encounter (Signed)
  Follow up Call-  Call back number 05/15/2012  Post procedure Call Back phone  # [phone] hm  Permission to leave phone message Yes    [facility]

## 2012-05-23 ENCOUNTER — Encounter: Payer: Self-pay | Admitting: Gastroenterology

## 2012-05-29 ENCOUNTER — Encounter: Payer: Self-pay | Admitting: Physician Assistant

## 2012-06-15 ENCOUNTER — Encounter: Payer: Self-pay | Admitting: Physician Assistant

## 2012-06-15 DIAGNOSIS — Z23 Encounter for immunization: Secondary | ICD-10-CM | POA: Insufficient documentation

## 2012-06-15 DIAGNOSIS — B1920 Unspecified viral hepatitis C without hepatic coma: Secondary | ICD-10-CM

## 2012-06-15 DIAGNOSIS — Z8619 Personal history of other infectious and parasitic diseases: Secondary | ICD-10-CM | POA: Insufficient documentation

## 2012-07-01 ENCOUNTER — Ambulatory Visit: Payer: Medicare Other | Admitting: Physician Assistant

## 2012-07-15 ENCOUNTER — Encounter: Payer: Self-pay | Admitting: Physician Assistant

## 2012-07-15 ENCOUNTER — Ambulatory Visit (INDEPENDENT_AMBULATORY_CARE_PROVIDER_SITE_OTHER): Payer: Medicare Other | Admitting: Emergency Medicine

## 2012-07-15 ENCOUNTER — Ambulatory Visit: Payer: Medicare Other

## 2012-07-15 VITALS — BP 126/82 | HR 61 | Temp 98.6°F | Resp 16 | Ht 69.5 in | Wt 243.0 lb

## 2012-07-15 DIAGNOSIS — M25579 Pain in unspecified ankle and joints of unspecified foot: Secondary | ICD-10-CM

## 2012-07-15 DIAGNOSIS — M25569 Pain in unspecified knee: Secondary | ICD-10-CM

## 2012-07-15 DIAGNOSIS — M545 Low back pain: Secondary | ICD-10-CM

## 2012-07-15 DIAGNOSIS — E119 Type 2 diabetes mellitus without complications: Secondary | ICD-10-CM

## 2012-07-15 DIAGNOSIS — G8929 Other chronic pain: Secondary | ICD-10-CM

## 2012-07-15 DIAGNOSIS — Z23 Encounter for immunization: Secondary | ICD-10-CM

## 2012-07-15 DIAGNOSIS — I10 Essential (primary) hypertension: Secondary | ICD-10-CM

## 2012-07-15 DIAGNOSIS — R809 Proteinuria, unspecified: Secondary | ICD-10-CM

## 2012-07-15 DIAGNOSIS — M25562 Pain in left knee: Secondary | ICD-10-CM

## 2012-07-15 DIAGNOSIS — M25572 Pain in left ankle and joints of left foot: Secondary | ICD-10-CM

## 2012-07-15 DIAGNOSIS — E785 Hyperlipidemia, unspecified: Secondary | ICD-10-CM

## 2012-07-15 LAB — GLUCOSE, POCT (MANUAL RESULT ENTRY): POC Glucose: 143 mg/dl — AB (ref 70–99)

## 2012-07-15 LAB — COMPREHENSIVE METABOLIC PANEL
ALT: 34 U/L (ref 0–53)
AST: 39 U/L — ABNORMAL HIGH (ref 0–37)
Albumin: 4.1 g/dL (ref 3.5–5.2)
CO2: 22 mEq/L (ref 19–32)
Calcium: 9.4 mg/dL (ref 8.4–10.5)
Chloride: 105 mEq/L (ref 96–112)
Potassium: 4.1 mEq/L (ref 3.5–5.3)

## 2012-07-15 LAB — LIPID PANEL: Cholesterol: 131 mg/dL (ref 0–200)

## 2012-07-15 LAB — POCT GLYCOSYLATED HEMOGLOBIN (HGB A1C): Hemoglobin A1C: 6.9

## 2012-07-15 MED ORDER — MELOXICAM 15 MG PO TABS: 15.0000 mg | ORAL_TABLET | Freq: Every day | ORAL | Status: DC

## 2012-07-15 MED ORDER — HYDROCODONE-ACETAMINOPHEN 5-325 MG PO TABS: 1.0000 | ORAL_TABLET | ORAL | Status: DC | PRN

## 2012-07-15 NOTE — Progress Notes (Signed)
  Subjective:    Patient ID: Gregg Flores, male    DOB: 11-19-1945, 67 y.o.   MRN: [id number]  HPI  Presents for follow up of DM and HTN along with continue LEFT lower leg pain. He finds the leg pain most concerning and states that the pain pills given last time were not strong enough. He inquires about a back brace to help with support because his chiropractor provided him with one after adjustments in 1985 and resolved a similar problem. Pain is described as left lateral ankle but radiates at times to above the knee and into the back. Pain is constant, does not change with activity level or specific movements. Home readings of glucose are in the 120-130s range but he does not measure frequently. Occasionally uses [employer] to measure BP at home - states he is normally in the 180s/100 range.   Review of Systems Denies: headache, vision changes, dizziness, SOB, chest pain, abdominal pain, urinary symptoms, swelling in extremities, numbness/tingling in extremities.     Objective:   Physical Exam BP 126/82  Pulse 61  Temp(Src) 98.6 F (37 C) (Oral)  Resp 16  Ht 5' 9.5" (1.765 m)  Wt 243 lb (110.224 kg)  BMI 35.38 kg/m2  SpO2 99%  General: WDWN male, appears stated age, NAD Resp: clear to auscultation anterior and posterior fields bilaterally, no rales/rhonchi/wheezes  Cardiac: RRR, no murmurs/rubs/gallops  Abdomen: normal bowel sounds, soft, non distended, non tender to palpation Extremities: LEFT lateral lower leg and ankle pain tender to palpation, no erythema discoloration edema noted. Strength 5/5 UE and LE. Dorsalis pedis pulses 2+ bilaterally.  Neuro: alert & oriented x3, cranial nerves II-XII grossly intact  Skin: no rashes, lesions  See diabetic foot exam.   Results for orders placed in visit on 07/15/12  GLUCOSE, POCT (MANUAL RESULT ENTRY)      Result Value Range   POC Glucose 143 (*) 70 - 99 mg/dl  POCT GLYCOSYLATED HEMOGLOBIN (HGB A1C)      Result Value Range   Hemoglobin A1C 6.9      Lumbar series xray read by: Dr. Cleta Alberts. Joint space narrowing at L5-S1. Anterior spurs noted on L4, L5, S1. L5 vertebrae appears compressed.       Assessment & Plan:  .Type II or unspecified type diabetes mellitus without mention of complication, not stated as uncontrolled - Plan: POCT glucose (manual entry), POCT glycosylated hemoglobin (Hb A1C), Comprehensive metabolic panel, Microalbumin, urine  Need for hepatitis A vaccination - Plan: Hepatitis A vaccine adult IM  Need for hepatitis B vaccination - Plan: Hepatitis B vaccine adult IM  Essential hypertension, benign  Hyperlipidemia LDL goal <70 - Plan: Lipid panel  Microalbuminuria - Plan: Microalbumin, urine  Chronic LBP - Plan: DG Lumbar Spine Complete, Ambulatory referral to Physical Therapy  Pain in joint, lower leg, left - Plan: DG Lumbar Spine Complete, Ambulatory referral to Physical Therapy, HYDROcodone-acetaminophen (NORCO) 5-325 MG per tablet  Pain in joint, ankle and foot, left - Plan: meloxicam (MOBIC) 15 MG tablet  Refer to PT for radicular leg pain.  Refill Mobic & Norco.  Return to clinic for the rest of Hep B series.  Hep C continued to be monitored by hepatology.  Encourage multiple measurements daily for glucose.

## 2012-07-15 NOTE — Progress Notes (Signed)
I have examined this patient along with the student and agree.  RTC 1 month for Hep B #2, and in 6 months for Hep B #3 and Hep A #2.  Hopefully, PT will improve his core strength and therefore improve his back pain/strain and resultant leg pain.  If not, consider MRI of the lumbar spine.  He's had benefit in the past with a chiropractor and a back brace.

## 2012-07-15 NOTE — Patient Instructions (Addendum)
I've referred you to physical therapy where they will work with you to help your back and leg pain.

## 2012-07-16 ENCOUNTER — Encounter: Payer: Self-pay | Admitting: Physician Assistant

## 2012-07-16 LAB — MICROALBUMIN, URINE: Microalb, Ur: 3.98 mg/dL — ABNORMAL HIGH (ref 0.00–1.89)

## 2012-08-06 ENCOUNTER — Other Ambulatory Visit: Payer: Self-pay | Admitting: Internal Medicine

## 2012-08-06 DIAGNOSIS — C220 Liver cell carcinoma: Secondary | ICD-10-CM

## 2012-08-13 ENCOUNTER — Ambulatory Visit
Admission: RE | Admit: 2012-08-13 | Discharge: 2012-08-13 | Disposition: A | Discharge status: Home / Self Care | Payer: Medicare Other | Source: Ambulatory Visit | Attending: Internal Medicine | Admitting: Internal Medicine

## 2012-08-13 DIAGNOSIS — C220 Liver cell carcinoma: Secondary | ICD-10-CM

## 2012-08-20 ENCOUNTER — Encounter: Payer: Self-pay | Admitting: Physician Assistant

## 2012-08-26 ENCOUNTER — Encounter: Payer: Self-pay | Admitting: Physician Assistant

## 2012-08-26 ENCOUNTER — Ambulatory Visit (INDEPENDENT_AMBULATORY_CARE_PROVIDER_SITE_OTHER): Payer: Medicare Other | Admitting: Physician Assistant

## 2012-08-26 VITALS — BP 120/82 | HR 86 | Temp 98.4°F | Resp 16 | Ht 69.5 in | Wt 235.6 lb

## 2012-08-26 DIAGNOSIS — M545 Low back pain: Secondary | ICD-10-CM

## 2012-08-26 DIAGNOSIS — M25562 Pain in left knee: Secondary | ICD-10-CM

## 2012-08-26 DIAGNOSIS — M25569 Pain in unspecified knee: Secondary | ICD-10-CM

## 2012-08-26 DIAGNOSIS — Z23 Encounter for immunization: Secondary | ICD-10-CM

## 2012-08-26 DIAGNOSIS — M25579 Pain in unspecified ankle and joints of unspecified foot: Secondary | ICD-10-CM

## 2012-08-26 DIAGNOSIS — M25572 Pain in left ankle and joints of left foot: Secondary | ICD-10-CM

## 2012-08-26 MED ORDER — HYDROCODONE-ACETAMINOPHEN 7.5-325 MG PO TABS: 1.0000 | ORAL_TABLET | Freq: Four times a day (QID) | ORAL | Status: DC | PRN

## 2012-08-26 MED ORDER — MELOXICAM 15 MG PO TABS: 15.0000 mg | ORAL_TABLET | Freq: Every day | ORAL | Status: DC

## 2012-08-26 MED ORDER — CYCLOBENZAPRINE HCL 10 MG PO TABS: 10.0000 mg | ORAL_TABLET | Freq: Every evening | ORAL | Status: DC | PRN

## 2012-08-26 NOTE — Progress Notes (Signed)
  Subjective:    Patient ID: Gregg Flores, male    DOB: February 19, 1945, 67 y.o.   MRN: [id number]  HPI This 67 y.o. male presents for Hep B vaccine #2 of 3, evaluation of continued low back pain and LEFT leg pain.  He describes the muscle in the calf "jumping" episodically.  The jumping area moves around, it may be low near the ankle, or in the center of the calf, or even above the knee posteriorly.  He's had no swelling, redness, warmth. No new injury or trauma.  He's run out of flexeril, meloxicam and hydrocodone.  He notes that he was needing to take 2 of the 5/325 mg tabs BID-TID for pain relief.  Past medical history, surgical history, family history, social history and problem list reviewed.   Review of Systems As above.    Objective:   Physical Exam  Blood pressure 120/82, pulse 86, temperature 98.4 F (36.9 C), temperature source Oral, resp. rate 16, height 5' 9.5" (1.765 m), weight 235 lb 9.6 oz (106.867 kg), SpO2 99.00%. Body mass index is 34.3 kg/(m^2). Well-developed, well nourished BM who is awake, alert and oriented, in NAD. HEENT: [place]/AT, sclera and conjunctiva are clear.   Neck: supple, non-tender, no lymphadenopathy, thyromegaly. Heart: RRR, no murmur Lungs: normal effort, CTA Extremities: no cyanosis, clubbing or edema. Skin: warm and dry without rash. Psychologic: good mood and appropriate affect, normal speech and behavior.       Assessment & Plan:  Need for hepatitis B vaccination - Plan: Hepatitis B vaccine adult IM, RTC in 5 months for Hepatitis B vaccine adult IM dose #3  Chronic LBP - Plan: meloxicam (MOBIC) 15 MG tablet, HYDROcodone-acetaminophen (NORCO) 7.5-325 MG per tablet  Pain in joint, lower leg, left - Plan: meloxicam (MOBIC) 15 MG tablet, HYDROcodone-acetaminophen (NORCO) 7.5-325 MG per tablet, cyclobenzaprine (FLEXERIL) 10 MG tablet  Need for hepatitis A immunization - Plan: RTC in 5 months for Hepatitis A vaccine adult IM dose #2  Fernande Bras, PA-C Physician Assistant-Certified [facility]

## 2012-08-26 NOTE — Patient Instructions (Signed)
Try putting a heating pad on the muscle when it starts to jump.  That will help it relax.  If it doesn't we can consider adding a muscle relaxer.

## 2012-08-28 NOTE — Progress Notes (Signed)
PA approved for cyclobenzoprine through 01/28/13, case # [id number]. Notified pharmacy.

## 2012-09-19 ENCOUNTER — Telehealth: Payer: Self-pay | Admitting: Hematology and Oncology

## 2012-09-26 ENCOUNTER — Other Ambulatory Visit: Payer: Medicare Other | Admitting: Lab

## 2012-09-26 ENCOUNTER — Ambulatory Visit: Payer: Self-pay

## 2012-09-30 ENCOUNTER — Other Ambulatory Visit: Payer: Self-pay | Admitting: Physician Assistant

## 2012-10-02 ENCOUNTER — Telehealth: Payer: Self-pay | Admitting: Internal Medicine

## 2012-10-02 NOTE — Telephone Encounter (Signed)
Moved 9/11 appt to 10/9 due to schedule change. lmonvm for pt and mailed schedule.

## 2012-10-09 ENCOUNTER — Other Ambulatory Visit: Payer: Self-pay | Admitting: Lab

## 2012-10-09 ENCOUNTER — Ambulatory Visit: Payer: Self-pay

## 2012-11-04 ENCOUNTER — Encounter: Payer: Self-pay | Admitting: Physician Assistant

## 2012-11-04 ENCOUNTER — Ambulatory Visit (INDEPENDENT_AMBULATORY_CARE_PROVIDER_SITE_OTHER): Payer: Medicare Other | Admitting: Physician Assistant

## 2012-11-04 VITALS — BP 124/86 | HR 65 | Temp 97.8°F | Resp 16 | Ht 69.5 in | Wt 233.0 lb

## 2012-11-04 DIAGNOSIS — I10 Essential (primary) hypertension: Secondary | ICD-10-CM

## 2012-11-04 DIAGNOSIS — R809 Proteinuria, unspecified: Secondary | ICD-10-CM

## 2012-11-04 DIAGNOSIS — E119 Type 2 diabetes mellitus without complications: Secondary | ICD-10-CM

## 2012-11-04 DIAGNOSIS — E785 Hyperlipidemia, unspecified: Secondary | ICD-10-CM

## 2012-11-04 LAB — COMPREHENSIVE METABOLIC PANEL
ALT: 25 U/L (ref 0–53)
AST: 32 U/L (ref 0–37)
Albumin: 4.1 g/dL (ref 3.5–5.2)
Alkaline Phosphatase: 47 U/L (ref 39–117)
BUN: 15 mg/dL (ref 6–23)
Calcium: 9.3 mg/dL (ref 8.4–10.5)
Chloride: 106 mEq/L (ref 96–112)
Potassium: 4.1 mEq/L (ref 3.5–5.3)
Sodium: 138 mEq/L (ref 135–145)
Total Protein: 7.4 g/dL (ref 6.0–8.3)

## 2012-11-04 LAB — CBC WITH DIFFERENTIAL/PLATELET
Basophils Absolute: 0.1 10*3/uL (ref 0.0–0.1)
Lymphocytes Relative: 40 % (ref 12–46)
Lymphs Abs: 1.8 10*3/uL (ref 0.7–4.0)
Neutrophils Relative %: 49 % (ref 43–77)
Platelets: 246 10*3/uL (ref 150–400)
RBC: 5.05 MIL/uL (ref 4.22–5.81)
WBC: 4.4 10*3/uL (ref 4.0–10.5)

## 2012-11-04 LAB — LIPID PANEL
Cholesterol: 125 mg/dL (ref 0–200)
LDL Cholesterol: 79 mg/dL (ref 0–99)
Triglycerides: 109 mg/dL (ref <150)
VLDL: 22 mg/dL (ref 0–40)

## 2012-11-04 LAB — GLUCOSE, POCT (MANUAL RESULT ENTRY): POC Glucose: 132 mg/dl — AB (ref 70–99)

## 2012-11-04 LAB — POCT GLYCOSYLATED HEMOGLOBIN (HGB A1C): Hemoglobin A1C: 6.8

## 2012-11-04 MED ORDER — PRAVASTATIN SODIUM 40 MG PO TABS: 40.0000 mg | ORAL_TABLET | Freq: Every evening | ORAL | Status: DC

## 2012-11-04 MED ORDER — BENAZEPRIL HCL 40 MG PO TABS: 40.0000 mg | ORAL_TABLET | Freq: Every day | ORAL | Status: DC

## 2012-11-04 MED ORDER — HYDROCHLOROTHIAZIDE 25 MG PO TABS: 25.0000 mg | ORAL_TABLET | ORAL | Status: DC

## 2012-11-04 MED ORDER — FENOFIBRATE 160 MG PO TABS: 160.0000 mg | ORAL_TABLET | Freq: Every day | ORAL | Status: DC

## 2012-11-04 MED ORDER — METFORMIN HCL 1000 MG PO TABS: 1000.0000 mg | ORAL_TABLET | Freq: Two times a day (BID) | ORAL | Status: DC

## 2012-11-04 MED ORDER — AMLODIPINE BESYLATE 10 MG PO TABS: 10.0000 mg | ORAL_TABLET | Freq: Every day | ORAL | Status: DC

## 2012-11-04 NOTE — Progress Notes (Signed)
I have examined this patient along with the student and agree.  Patient Active Problem List   Diagnosis Date Noted  . Hepatitis C 06/15/2012  . Need for hepatitis B vaccination 06/15/2012  . Need for hepatitis A vaccination 06/15/2012  . Hyperlipidemia LDL goal <70 04/06/2012  . History of tobacco abuse 09/25/2011  . Essential hypertension, benign   . Chronic LBP   . Type II or unspecified type diabetes mellitus without mention of complication, not stated as uncontrolled   . Diverticulosis   . BPH (benign prostatic hyperplasia)   . Monoclonal gammopathy   . Cataracts, bilateral   . ED (erectile dysfunction)   . Microalbuminuria   . Benign neoplasm of colon 07/01/2008

## 2012-11-04 NOTE — Progress Notes (Signed)
  Subjective:    Patient ID: Gregg Flores, male    DOB: [birth year], 67 y.o.   MRN: [id number]  HPI  [name] is a 67 year old African American male who is here for a follow up of his DM, hypertension, and hyperlipidemia. He is currently smoking 1-2 cigarettes per week. He does not check his BS or BP at home. He has no complaints today. He is still taking all of his medications as prescribed. He has already received his flu shot and is having his diabetic eye exam done in 1 week.   Mr. Wandler denies chest pain/tightness, palpitations, headaches, dizziness, numbness or tingling in his extremities. No edema or SOB. The pain in his left lower leg has resolved and he no longer needs the medications we prescribed for pain. He occasionally takes a Meloxicam which resolves the pain in his knee.   Review of Systems    as above Objective:   Physical Exam  Vitals reviewed. Constitutional: He is oriented to person, place, and time. He appears well-developed and well-nourished.  HENT:  Head: Normocephalic and atraumatic.  Eyes: Pupils are equal, round, and reactive to light.  Fundoscopic exam:      The right eye shows no arteriolar narrowing, no AV nicking, no exudate, no hemorrhage and no papilledema. The right eye shows red reflex.       The left eye shows no arteriolar narrowing, no AV nicking, no exudate, no hemorrhage and no papilledema. The left eye shows red reflex.  Cardiovascular: Normal rate, regular rhythm, normal heart sounds and intact distal pulses.   Pulmonary/Chest: Effort normal and breath sounds normal.  Musculoskeletal: He exhibits no edema.  Neurological: He is alert and oriented to person, place, and time. He has normal reflexes.  See diabetic foot exam  Skin: Skin is warm and dry.  Psychiatric: He has a normal mood and affect.    BP 124/86  Pulse 65  Temp(Src) 97.8 F (36.6 C) (Oral)  Resp 16  Ht 5' 9.5" (1.765 m)  Wt 233 lb (105.688 kg)  BMI 33.93 kg/m2   SpO2 100%  Recent Results (from the past 2160 hour(s))  GLUCOSE, POCT (MANUAL RESULT ENTRY)     Status: Abnormal   Collection Time    11/04/12 10:30 AM      Result Value Range   POC Glucose 132 (*) 70 - 99 mg/dl  POCT GLYCOSYLATED HEMOGLOBIN (HGB A1C)     Status: None   Collection Time    11/04/12 10:30 AM      Result Value Range   Hemoglobin A1C 6.8         Assessment & Plan:  Type II or unspecified type diabetes mellitus without mention of complication, not stated as uncontrolled - Plan: HM Diabetes Foot Exam, POCT glucose (manual entry), POCT glycosylated hemoglobin (Hb A1C), Comprehensive metabolic panel, metFORMIN (GLUCOPHAGE) 1000 MG tablet  Essential hypertension, benign - Plan: CBC with Differential  Hyperlipidemia LDL goal <70 - Plan: Lipid panel  Microalbuminuria - Plan: Microalbumin, urine  HTN (hypertension) - Plan: amLODipine (NORVASC) 10 MG tablet, benazepril (LOTENSIN) 40 MG tablet, hydrochlorothiazide (HYDRODIURIL) 25 MG tablet  Hyperlipidemia - Plan: fenofibrate 160 MG tablet, pravastatin (PRAVACHOL) 40 MG tablet  F/U in December for last doses of Hep B and Hep A vaccine series. F/U in 3 months for recheck of DM and fasting labs. Continue meds as above.

## 2012-11-04 NOTE — Patient Instructions (Addendum)
Come back in December for Hepatitis B #3 and Hepatitis A #2 injections (two shots).  We will contactl you with your lab results.  Continue meds and diet.  Stop smoking and increase your daily exercise!

## 2012-11-05 ENCOUNTER — Other Ambulatory Visit: Payer: Self-pay | Admitting: Internal Medicine

## 2012-11-05 ENCOUNTER — Encounter: Payer: Self-pay | Admitting: Physician Assistant

## 2012-11-05 DIAGNOSIS — D472 Monoclonal gammopathy: Secondary | ICD-10-CM

## 2012-11-05 LAB — MICROALBUMIN, URINE: Microalb, Ur: 3.51 mg/dL — ABNORMAL HIGH (ref 0.00–1.89)

## 2012-11-06 ENCOUNTER — Telehealth: Payer: Self-pay | Admitting: Internal Medicine

## 2012-11-06 ENCOUNTER — Ambulatory Visit (HOSPITAL_BASED_OUTPATIENT_CLINIC_OR_DEPARTMENT_OTHER): Payer: Medicare Other | Admitting: Internal Medicine

## 2012-11-06 ENCOUNTER — Other Ambulatory Visit (HOSPITAL_BASED_OUTPATIENT_CLINIC_OR_DEPARTMENT_OTHER): Payer: Medicare Other | Admitting: Lab

## 2012-11-06 VITALS — BP 133/87 | HR 76 | Temp 98.2°F | Resp 18 | Ht 69.5 in | Wt 235.4 lb

## 2012-11-06 DIAGNOSIS — Z720 Tobacco use: Secondary | ICD-10-CM

## 2012-11-06 DIAGNOSIS — D472 Monoclonal gammopathy: Secondary | ICD-10-CM

## 2012-11-06 DIAGNOSIS — B1920 Unspecified viral hepatitis C without hepatic coma: Secondary | ICD-10-CM

## 2012-11-06 DIAGNOSIS — F17200 Nicotine dependence, unspecified, uncomplicated: Secondary | ICD-10-CM

## 2012-11-06 LAB — COMPREHENSIVE METABOLIC PANEL (CC13)
AST: 40 U/L — ABNORMAL HIGH (ref 5–34)
Alkaline Phosphatase: 58 U/L (ref 40–150)
Anion Gap: 10 mEq/L (ref 3–11)
CO2: 22 mEq/L (ref 22–29)
Creatinine: 0.8 mg/dL (ref 0.7–1.3)
Glucose: 115 mg/dl (ref 70–140)
Sodium: 141 mEq/L (ref 136–145)
Total Bilirubin: 0.56 mg/dL (ref 0.20–1.20)
Total Protein: 8.2 g/dL (ref 6.4–8.3)

## 2012-11-06 LAB — CBC WITH DIFFERENTIAL/PLATELET
BASO%: 0.9 % (ref 0.0–2.0)
EOS%: 1 % (ref 0.0–7.0)
Eosinophils Absolute: 0 10*3/uL (ref 0.0–0.5)
HCT: 42.1 % (ref 38.4–49.9)
LYMPH%: 44.9 % (ref 14.0–49.0)
MCHC: 33.1 g/dL (ref 32.0–36.0)
MCV: 84.9 fL (ref 79.3–98.0)
MONO%: 8.1 % (ref 0.0–14.0)
NEUT%: 45.1 % (ref 39.0–75.0)
Platelets: 247 10*3/uL (ref 140–400)
RBC: 4.96 10*6/uL (ref 4.20–5.82)

## 2012-11-06 NOTE — Telephone Encounter (Signed)
Gave pt appt for lab and MD for October 2015 °

## 2012-11-06 NOTE — Patient Instructions (Addendum)
MGUS (monoclonal gammopathy of unknown significance) is a condition where the body makes an abnormal protein, called a paraprotein. These paraproteins are found in the blood and urine when they're tested.  MGUS is linked to the immune system. The immune system helps the body fight infection and disease. It is made up of organs such as the bone marrow, the spleen, lymph nodes and white blood cells.  MGUS affects plasma cells. Plasma cells are a type of white blood cell that make antibodies to help fight infections. Antibodies are made from a protein called immunoglobulin.  With MGUS, some plasma cells make an abnormally high number of a type of antibody called a paraprotein (or M-protein). This paraprotein doesn't do anything useful, and for most people it isn't harmful.  Although MGUS is not a cancer, people who have it are at slightly higher risk of certain cancers. The two main cancer types people with MGUS are more at risk of developing are myeloma (cancer of the plasma cells) and lymphoma (cancer of the lymphatic system).  These cancers also produce large amounts of paraproteins. Although the levels of paraprotein are raised in MGUS, they're not as high as the levels in people with cancer.  Most people with MGUS remain well and it causes few problems. Because a small number of people may go on to develop cancer, everyone with MGUS has regular checks.   Causes of MGUS Back to top MGUS is a rare condition that becomes slightly more common as people get older. The cause is unknown. It's more common in people with conditions that affect the immune system, such as rheumatoid arthritis and certain infections.   Signs and symptoms of MGUS Back to top MGUS is usually found during a blood test carried out for some other reason. It doesn't usually cause any symptoms.  Occasionally, people with MGUS have numbness or tingling in their hands and feet, or problems with their balance. This may be due to damage to  nerves (peripheral neuropathy) caused by the paraprotein in the blood.  If these symptoms are troublesome, or get worse, you may be referred to a neurologist (a doctor who specialises in conditions of the nervous system).   Diagnosis of MGUS Back to top You will be usually be seen by a haematologist (a doctor who specialises in treating blood disorders). The haematologist may examine you and ask you questions about your general health.  You will usually have blood and urine tests. Your haematologist may also advise you to have other tests to check for myeloma or lymphoma. These tests may include x-rays, scans and, occasionally, a bone marrow test. The bone marrow is where blood cells are made and develop until they're ready to go into the blood.  Not everyone will need to have these tests and your haematologist will advise you on which are appropriate for you.  Blood tests  You have a blood test called serum protein electrophoresis to diagnose MGUS. This test is also used to check on MGUS. This test measures the type and amount of paraprotein produced by the plasma cells.  You will also have a test to check the number of different types of blood cells (full blood count). This is to make sure your bone marrow is working well  Your doctor may arrange blood tests to check how well your liver and kidneys are working. You may also have your calcium levels checked, as these can be raised in myeloma.  Urine tests  You will be asked  to give samples of your urine, which will be checked for paraproteins.  X-rays  Some people may have x-rays taken of different bones in the body. This is to check for damage to the bones, which can be caused by myeloma.  CT scan (computerised tomography)  A CT scan takes a series of x-rays, which build up a three-dimensional picture of the inside of the body. It is used to find out if lymph nodes, or organs such as the liver or spleen, are enlarged. The scan takes 10-30 minutes  and is painless. It uses a small amount of radiation, which is very unlikely to harm you and will not harm anyone you come into contact with.  You will be asked not to eat or drink for at least four hours before the scan. You may be given a drink or injection of a dye, which allows particular areas to be seen more clearly. This may make you feel hot all over for a few minutes. It's important to let your doctor know if you are allergic to iodine or have asthma, because you could have a more serious reaction to the injection.  Bone marrow sample  In some situations, the haematologist may recommend that a sample of bone marrow is taken (biopsy) to be examined under a microscope.  A doctor or nurse takes a small sample of bone marrow from the back of the hip bone (pelvis). Before the bone marrow sample is taken, you'll be given a local anaesthetic injection to numb the area. You may also be offered a short-acting sedative to reduce any pain or discomfort during the test.  You'll be asked to lie on your side. The doctor or nurse then passes a needle through the skin into the bone and draws a small sample of liquid marrow into a syringe (bone marrow aspirate). It can feel uncomfortable for a few seconds when the liquid marrow is drawn into the syringe. After this, they take a small core of marrow from the bone (a trephine biopsy). A small plaster or dressing is placed over the skin.  You may feel bruised after having a sample of bone marrow taken, and have an ache for a few days. This can be eased with mild painkillers.  The test is usually done as an outpatient and takes about 15-20 minutes.   Treatment and follow-up for MGUS Back to top MGUS doesn't need treatment as it doesn't usually cause any symptoms. In most people, MGUS remains stable and may never cause any problems. However, because of the small risk of MGUS developing into a cancer, such as myeloma or lymphoma, regular check-ups are important. Always  contact your doctor between check-ups if you develop any of the following symptoms:  new constant bone pain in one area (such as in the back, ribs, hip or pelvis)  unexplained weight loss  increasing breathlessness  extreme tiredness (fatigue)  having different infections one after the other, caused by not having enough healthy white blood cells.  You will usually have a blood test to check your paraprotein levels every 3-4 months for the first year. This can be done by your own GP or your haematologist.  Your doctors will monitor the pattern of the paraprotein levels - whether they stay roughly the same at each check, or are gradually rising. If the paraprotein level remains steady and there are no other problems, the time between your appointments will become longer.  If the paraprotein levels are rising, or if you  have symptoms, tests may need to be repeated or new tests may be carried out.   Hepatitis C Hepatitis C is a viral infection of the liver. Infection may go undetected for months or years because symptoms may be absent or very mild. Chronic liver disease is the main danger of hepatitis C. This may lead to scarring of the liver (cirrhosis), liver failure, and liver cancer. CAUSES  Hepatitis C is caused by the hepatitis C virus (HCV). Formerly, hepatitis C infections were most commonly transmitted through blood transfusions. In the early 1990s, routine testing of donated blood for hepatitis C and exclusion of blood that tests positive for HCV began. Now, HCV is most commonly transmitted from person to person through injection drug use, sharing needles, or sex with an infected person. A caregiver may also get the infection from exposure to the blood of an infected patient by way of a cut or needle stick.  SYMPTOMS  Acute Phase Many cases of acute HCV infection are mild and cause few problems.Some people may not even realize they are sick.Symptoms in others may last a few weeks to several  months and include:  Feeling very tired.  Loss of appetite.  Nausea.  Vomiting.  Abdominal pain.  Dark yellow urine.  Yellow skin and eyes (jaundice).  Itching of the skin. Chronic Phase  Between 50% to 85% of people who get HCV infection become "chronic carriers." They often have no symptoms, but the virus stays in their body.They may spread the virus to others and can get long-term liver disease.  Many people with chronic HCV infection remain healthy for many years. However, up to 1 in 5 chronically infected people may develop severe liver diseases including scarring of the liver (cirrhosis), liver failure, or liver cancer. DIAGNOSIS  Diagnosis of hepatitis C infection is made by testing blood for the presence of hepatitis C viral particles called RNA. Other tests may also be done to measure the status of current liver function, exclude other liver problems, or assess liver damage. TREATMENT  Treatment with many antiviral drugs is available and recommended for some patients with chronic HCV infection. Drug treatment is generally considered appropriate for patients who:  Are 24 years of age or older.  Have a positive test for HCV particles in the blood.  Have a liver tissue sample (biopsy) that shows chronic hepatitis and significant scarring (fibrosis).  Do not have signs of liver failure.  Have acceptable blood test results that confirm the wellness of other body organs.  Are willing to be treated and conform to treatment requirements.  Have no other circumstances that would prevent treatment from being recommended (contraindications). All people who are offered and choose to receive drug treatment must understand that careful medical follow up for many months and even years is crucial in order to make successful care possible. The goal of drug treatment is to eliminate any evidence of HCV in the blood on a long-term basis. This is called a "sustained virologic response"  or SVR. Achieving a SVR is associated with a decrease in the chance of life-threatening liver problems, need for a liver transplant, liver cancer rates, and liver-related complications. Successful treatment currently requires taking treatment drugs for at least 24 weeks and up to 72 weeks. An injected drug (interferon) given weekly and an oral antiviral medicine taken daily are usually prescribed. Side effects from these drugs are common and some may be very serious. Your response to treatment must be carefully monitored by both you  and your caregiver throughout the entire treatment period. PREVENTION There is no vaccine for hepatitis C. The only way to prevent the disease is to reduce the risk of exposure to the virus.   Avoid sharing drug needles or personal items like toothbrushes, razors, and nail clippers with an infected person.  Healthcare workers need to avoid injuries and wear appropriate protective equipment such as gloves, gowns, and face masks when performing invasive medical or nursing procedures. HOME CARE INSTRUCTIONS  To avoid making your liver disease worse:  Strictly avoid drinking alcohol.  Carefully review all new prescriptions of medicines with your caregiver. Ask your caregiver which drugs you should avoid. The following drugs are toxic to the liver, and your caregiver may tell you to avoid them:  Isoniazid.  Methyldopa.  Acetaminophen.  Anabolic steroids (muscle-building drugs).  Erythromycin.  Oral contraceptives (birth control pills).  Check with your caregiver to make sure medicine you are currently taking will not be harmful.  Periodic blood tests may be required. Follow your caregiver's advice about when you should have blood tests.  Avoid a sexual relationship until advised otherwise by your caregiver.  Avoid activities that could expose other people to your blood. Examples include sharing a toothbrush, nail clippers, razors, and needles.  Bed rest is  not necessary, but it may make you feel better. Recovery time is not related to the amount of rest you receive.  This infection is contagious. Follow your caregiver's instructions in order to avoid spread of the infection. SEEK IMMEDIATE MEDICAL CARE IF:  You have increasing fatigue or weakness.  You have an oral temperature above 102 F (38.9 C), not controlled by medicine.  You develop loss of appetite, nausea, or vomiting.  You develop jaundice.  You develop easy bruising or bleeding.  You develop any severe problems as a result of your treatment. MAKE SURE YOU:   Understand these instructions.  Will watch your condition.  Will get help right away if you are not doing well or get worse. Document Released: 01/13/2000 Document Revised: 04/09/2011 Document Reviewed: 05/17/2010 [facility] Patient Information 2014 [place], [place].

## 2012-11-07 LAB — IGG, IGA, IGM
IgG (Immunoglobin G), Serum: 1440 mg/dL (ref 650–1600)
IgM, Serum: 60 mg/dL (ref 41–251)

## 2012-11-07 LAB — KAPPA/LAMBDA LIGHT CHAINS
Kappa free light chain: 3.49 mg/dL — ABNORMAL HIGH (ref 0.33–1.94)
Kappa:Lambda Ratio: 1.12 (ref 0.26–1.65)
Lambda Free Lght Chn: 3.13 mg/dL — ABNORMAL HIGH (ref 0.57–2.63)

## 2012-11-08 NOTE — Progress Notes (Signed)
[facility] OFFICE PROGRESS NOTE  Gregg,CHELLE, PA-C [address] [place] [address]  DIAGNOSIS: Monoclonal gammopathy - Plan: CBC with Differential, Comprehensive metabolic panel, SPEP & IFE with QIG, Kappa/lambda light chains  Hepatitis C - Plan: CBC with Differential, Comprehensive metabolic panel, SPEP & IFE with QIG, Kappa/lambda light chains  Chief Complaint  Patient presents with  . IgG kappa MGUS    CURRENT THERAPY:  Observation.   INTERVAL HISTORY: Gregg Flores 67 y.o. 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  He was last seen by Dr. Arline Asp on 09/27/2011.  He denies any hospitalizations or emergency room visits.  His weight is stable with a good appetite.  He reports that he he had a liver biopsy in June and was told it was fine.  He has a history of hepatitis C which is being managed by hepatology.  He drinks alcohol 2-3 times a week.  He smokes about cigarettes daily.  He denies any constitutional symptoms including fevers or chills or acute shortness of breath.   MEDICAL HISTORY: Past Medical History  Diagnosis Date  . Essential hypertension, benign 1990  . Chronic LBP   . Type II or unspecified type diabetes mellitus without mention of complication, not stated as uncontrolled   . Diverticulosis   . Colon polyps   . BPH (benign prostatic hyperplasia)   . Monoclonal gammopathy 04/2008  . Cataracts, bilateral   . ED (erectile dysfunction)   . Microalbuminuria     INTERIM HISTORY: has Benign neoplasm of colon; Essential hypertension, benign; Chronic LBP; Type II or unspecified type diabetes mellitus without mention of complication, not stated as uncontrolled; Diverticulosis; BPH (benign prostatic hyperplasia); Monoclonal gammopathy; Cataracts, bilateral; ED (erectile dysfunction); Microalbuminuria; History of tobacco abuse; Hyperlipidemia LDL goal <70; Hepatitis C; Need for  hepatitis B vaccination; and Need for hepatitis A vaccination on his problem list.    ALLERGIES:  has No Known Allergies.  MEDICATIONS: has a current medication list which includes the following prescription(s): amlodipine, benazepril, fenofibrate, hydrochlorothiazide, ipratropium, meloxicam, metformin, pravastatin, and ra aspirin adult low strength.  SURGICAL HISTORY:  Past Surgical History  Procedure Laterality Date  . Transurethral resection of prostate      REVIEW OF SYSTEMS:   Constitutional: Denies fevers, chills or abnormal weight loss Eyes: Denies blurriness of vision Ears, nose, mouth, throat, and face: Denies mucositis or sore throat Respiratory: Denies cough, dyspnea or wheezes Cardiovascular: Denies palpitation, chest discomfort or lower extremity swelling Gastrointestinal:  Denies nausea, heartburn or change in bowel habits Skin: Denies abnormal skin rashes Lymphatics: Denies new lymphadenopathy or easy bruising Neurological:Denies numbness, tingling or new weaknesses Behavioral/Psych: Mood is stable, no new changes  All other systems were reviewed with the patient and are negative.  PHYSICAL EXAMINATION: ECOG PERFORMANCE STATUS: 0 - Asymptomatic  Blood pressure 133/87, pulse 76, temperature 98.2 F (36.8 C), temperature source Oral, resp. rate 18, height 5' 9.5" (1.765 m), weight 235 lb 6.4 oz (106.777 kg), SpO2 100.00%.  GENERAL:alert, no distress and comfortable SKIN: skin color, texture, turgor are normal, no rashes or significant lesions EYES: normal, Conjunctiva are pink and non-injected, sclera clear OROPHARYNX:no exudate, no erythema and lips, buccal mucosa, and tongue normal  NECK: supple, thyroid normal size, non-tender, without nodularity LYMPH:  no palpable lymphadenopathy in the cervical, axillary or supraclavicular LUNGS: clear to auscultation and percussion with normal breathing effort HEART: regular rate & rhythm and no murmurs  and no lower extremity  edema ABDOMEN:abdomen soft, non-tender and normal bowel sounds; moderately obese without stigmata of liver disease Musculoskeletal:no cyanosis of digits and no clubbing  NEURO: alert & oriented x 3 with fluent speech, no focal motor/sensory deficits   LABORATORY DATA: Results for orders placed in visit on 11/06/12 (from the past 48 hour(s))  CBC WITH DIFFERENTIAL     Status: None   Collection Time    11/06/12 12:10 PM      Result Value Range   WBC 4.8  4.0 - 10.3 10e3/uL   NEUT# 2.1  1.5 - 6.5 10e3/uL   HGB 13.9  13.0 - 17.1 g/dL   HCT 69.6  29.5 - 28.4 %   Platelets 247  140 - 400 10e3/uL   MCV 84.9  79.3 - 98.0 fL   MCH 28.1  27.2 - 33.4 pg   MCHC 33.1  32.0 - 36.0 g/dL   RBC 1.32  4.40 - 1.02 10e6/uL   RDW 13.8  11.0 - 14.6 %   lymph# 2.1  0.9 - 3.3 10e3/uL   MONO# 0.4  0.1 - 0.9 10e3/uL   Eosinophils Absolute 0.0  0.0 - 0.5 10e3/uL   Basophils Absolute 0.0  0.0 - 0.1 10e3/uL   NEUT% 45.1  39.0 - 75.0 %   LYMPH% 44.9  14.0 - 49.0 %   MONO% 8.1  0.0 - 14.0 %   EOS% 1.0  0.0 - 7.0 %   BASO% 0.9  0.0 - 2.0 %  COMPREHENSIVE METABOLIC PANEL (CC13)     Status: Abnormal   Collection Time    11/06/12 12:10 PM      Result Value Range   Sodium 141  136 - 145 mEq/L   Potassium 3.7  3.5 - 5.1 mEq/L   Chloride 109  98 - 109 mEq/L   CO2 22  22 - 29 mEq/L   Glucose 115  70 - 140 mg/dl   BUN 72.5  7.0 - 36.6 mg/dL   Creatinine 0.8  0.7 - 1.3 mg/dL   Total Bilirubin 4.40  0.20 - 1.20 mg/dL   Alkaline Phosphatase 58  40 - 150 U/L   AST 40 (*) 5 - 34 U/L   ALT 32  0 - 55 U/L   Total Protein 8.2  6.4 - 8.3 g/dL   Albumin 3.7  3.5 - 5.0 g/dL   Calcium 9.6  8.4 - 34.7 mg/dL   Anion Gap 10  3 - 11 mEq/L  KAPPA/LAMBDA LIGHT CHAINS     Status: Abnormal   Collection Time    11/06/12 12:10 PM      Result Value Range   Kappa free light chain 3.49 (*) 0.33 - 1.94 mg/dL   Lambda Free Lght Chn 3.13 (*) 0.57 - 2.63 mg/dL   Kappa:Lambda Ratio 4.25  0.26 - 1.65  IGG, IGA, IGM     Status:  None   Collection Time    11/06/12 12:10 PM      Result Value Range   IgG (Immunoglobin G), Serum 1440  650 - 1600 mg/dL   IgA 956  68 - 387 mg/dL   IgM, Serum 60  41 - 564 mg/dL       Labs:  Lab Results  Component Value Date   WBC 4.8 11/06/2012   HGB 13.9 11/06/2012   HCT 42.1 11/06/2012   MCV 84.9 11/06/2012   PLT 247 11/06/2012   NEUTROABS 2.1 11/06/2012      Chemistry  Component Value Date/Time   NA 141 11/06/2012 1210   NA 138 11/04/2012 1023   NA 137 12/19/2010   K 3.7 11/06/2012 1210   K 4.1 11/04/2012 1023   CL 106 11/04/2012 1023   CL 104 09/27/2011 1028   CO2 22 11/06/2012 1210   CO2 25 11/04/2012 1023   BUN 11.6 11/06/2012 1210   BUN 15 11/04/2012 1023   BUN 17 12/19/2010   CREATININE 0.8 11/06/2012 1210   CREATININE 0.85 11/04/2012 1023   CREATININE 1.27 03/29/2011 1010   CREATININE 0.9 12/19/2010   GLU 114 12/19/2010      Component Value Date/Time   CALCIUM 9.6 11/06/2012 1210   CALCIUM 9.3 11/04/2012 1023   ALKPHOS 58 11/06/2012 1210   ALKPHOS 47 11/04/2012 1023   AST 40* 11/06/2012 1210   AST 32 11/04/2012 1023   ALT 32 11/06/2012 1210   ALT 25 11/04/2012 1023   BILITOT 0.56 11/06/2012 1210   BILITOT 0.5 11/04/2012 1023     Basic Metabolic Panel:  Recent Labs Lab 11/04/12 1023 11/06/12 1210  NA 138 141  K 4.1 3.7  CL 106  --   CO2 25 22  GLUCOSE 113* 115  BUN 15 11.6  CREATININE 0.85 0.8  CALCIUM 9.3 9.6   GFR Estimated Creatinine Clearance: 107.4 ml/min (by C-G formula based on Cr of 0.8). Liver Function Tests:  Recent Labs Lab 11/04/12 1023 11/06/12 1210  AST 32 40*  ALT 25 32  ALKPHOS 47 58  BILITOT 0.5 0.56  PROT 7.4 8.2  ALBUMIN 4.1 3.7    CBC:  Recent Labs Lab 11/04/12 1023 11/06/12 1210  WBC 4.4 4.8  NEUTROABS 2.2 2.1  HGB 14.3 13.9  HCT 41.3 42.1  MCV 81.8 84.9  PLT 246 247     RADIOGRAPHIC STUDIES: Clinical Data: Hepatitis C, hepatoma surveillance  LIMITED ABDOMINAL ULTRASOUND - RIGHT UPPER QUADRANT  Comparison:  None.  Findings:  Gallbladder: The gallbladder is well visualized and no gallstones  are seen. There is no pain over the gallbladder with compression.  .  Common bile duct: The common bile duct is normal measuring 2.9 mm  in diameter  Liver: The liver is diffusely echogenic, consistent with fatty  infiltration. No focal abnormality is seen.  IMPRESSION:  1. Very echogenic liver parenchyma consistent with diffuse fatty  infiltration. No focal hepatic lesion.  2. No gallstones.    ASSESSMENT: Gregg Flores 67 y.o. male with a history of Monoclonal gammopathy - Plan: CBC with Differential, Comprehensive metabolic panel, SPEP & IFE with QIG, Kappa/lambda light chains  Hepatitis C - Plan: CBC with Differential, Comprehensive metabolic panel, SPEP & IFE with QIG, Kappa/lambda light chains   PLAN: 1. IgG kappa monoclonal gammopathy of uncertain significance. --We provided Mr. Viveiros a detailed discussion about MGUS and its risk to progression to multiple myeloma (1% per year).  We will check quantitative immunoglobulins in 6 months and plant to see him again in 1 year at which time we will check CBC, chemistries, and quantitative immunoglobulins.  If it appears that his IgG level is increasing then we may want to do some additional studies which might include urine collection and immunofixation electrophoresis, metastatic bone survey, and bone marrow exam.   2. Hepatitis C. --He is receiving his hepatitis A, B vaccination.  Continue hepatoma surveillance and treatment for his hepatitis C.  Last ultrasound (noted above) in July 2014.    3. Tobacco Abuse. -- I urged him to quit smoking and  he declines tobacco smoking cessation classes.  All questions were answered. The patient knows to call the clinic with any problems, questions or concerns. We can certainly see the patient much sooner if necessary.  I spent 15 minutes counseling the patient face to face. The total time spent in the  appointment was 25 minutes.    Asiyah Pineau, MD 11/07/2012 7:00AM

## 2012-11-25 ENCOUNTER — Encounter: Payer: Self-pay | Admitting: Physician Assistant

## 2013-01-26 ENCOUNTER — Ambulatory Visit (INDEPENDENT_AMBULATORY_CARE_PROVIDER_SITE_OTHER): Payer: Medicare Other | Admitting: Family Medicine

## 2013-01-26 VITALS — BP 137/82 | HR 85 | Temp 98.0°F | Resp 16

## 2013-01-26 DIAGNOSIS — Z23 Encounter for immunization: Secondary | ICD-10-CM

## 2013-02-10 ENCOUNTER — Ambulatory Visit (INDEPENDENT_AMBULATORY_CARE_PROVIDER_SITE_OTHER): Payer: Medicare Other | Admitting: Physician Assistant

## 2013-02-10 ENCOUNTER — Encounter: Payer: Self-pay | Admitting: Physician Assistant

## 2013-02-10 VITALS — BP 135/88 | HR 70 | Temp 98.1°F | Resp 16 | Ht 69.5 in | Wt 242.0 lb

## 2013-02-10 DIAGNOSIS — E785 Hyperlipidemia, unspecified: Secondary | ICD-10-CM

## 2013-02-10 DIAGNOSIS — I10 Essential (primary) hypertension: Secondary | ICD-10-CM

## 2013-02-10 DIAGNOSIS — B353 Tinea pedis: Secondary | ICD-10-CM

## 2013-02-10 DIAGNOSIS — Z23 Encounter for immunization: Secondary | ICD-10-CM

## 2013-02-10 DIAGNOSIS — E119 Type 2 diabetes mellitus without complications: Secondary | ICD-10-CM

## 2013-02-10 LAB — LIPID PANEL
CHOL/HDL RATIO: 5.8 ratio
Cholesterol: 151 mg/dL (ref 0–200)
HDL: 26 mg/dL — ABNORMAL LOW (ref >39)
LDL Cholesterol: 98 mg/dL (ref 0–99)
TRIGLYCERIDES: 137 mg/dL (ref <150)
VLDL: 27 mg/dL (ref 0–40)

## 2013-02-10 LAB — COMPLETE METABOLIC PANEL WITH GFR
ALK PHOS: 42 U/L (ref 39–117)
ALT: 16 U/L (ref 0–53)
AST: 20 U/L (ref 0–37)
Albumin: 4.3 g/dL (ref 3.5–5.2)
BILIRUBIN TOTAL: 0.4 mg/dL (ref 0.3–1.2)
BUN: 17 mg/dL (ref 6–23)
CO2: 22 mEq/L (ref 19–32)
CREATININE: 0.94 mg/dL (ref 0.50–1.35)
Calcium: 9.5 mg/dL (ref 8.4–10.5)
Chloride: 104 mEq/L (ref 96–112)
GFR, EST NON AFRICAN AMERICAN: 83 mL/min
GFR, Est African American: >89 mL/min
GLUCOSE: 119 mg/dL — AB (ref 70–99)
Potassium: 4.3 mEq/L (ref 3.5–5.3)
Sodium: 137 mEq/L (ref 135–145)
Total Protein: 7.7 g/dL (ref 6.0–8.3)

## 2013-02-10 LAB — POCT GLYCOSYLATED HEMOGLOBIN (HGB A1C): Hemoglobin A1C: 6.8

## 2013-02-10 LAB — GLUCOSE, POCT (MANUAL RESULT ENTRY): POC GLUCOSE: 124 mg/dL — AB (ref 70–99)

## 2013-02-10 MED ORDER — PRAVASTATIN SODIUM 40 MG PO TABS: 40.0000 mg | ORAL_TABLET | Freq: Every evening | ORAL | Status: DC

## 2013-02-10 MED ORDER — BENAZEPRIL HCL 40 MG PO TABS: 40.0000 mg | ORAL_TABLET | Freq: Every day | ORAL | Status: DC

## 2013-02-10 MED ORDER — KETOCONAZOLE 2 % EX CREA: 1.0000 "application " | TOPICAL_CREAM | Freq: Three times a day (TID) | CUTANEOUS | Status: DC

## 2013-02-10 MED ORDER — AMLODIPINE BESYLATE 10 MG PO TABS: 10.0000 mg | ORAL_TABLET | Freq: Every day | ORAL | Status: DC

## 2013-02-10 MED ORDER — HYDROCHLOROTHIAZIDE 25 MG PO TABS: 25.0000 mg | ORAL_TABLET | ORAL | Status: DC

## 2013-02-10 MED ORDER — FENOFIBRATE 160 MG PO TABS: 160.0000 mg | ORAL_TABLET | Freq: Every day | ORAL | Status: DC

## 2013-02-10 MED ORDER — ZOSTER VACCINE LIVE 19400 UNT/0.65ML ~~LOC~~ SOLR: 0.6500 mL | Freq: Once | SUBCUTANEOUS | Status: DC

## 2013-02-10 MED ORDER — METFORMIN HCL 1000 MG PO TABS: 1000.0000 mg | ORAL_TABLET | Freq: Two times a day (BID) | ORAL | Status: DC

## 2013-02-10 NOTE — Progress Notes (Signed)
   Subjective:    Patient ID: Gregg Flores, male    DOB: 02-Oct-1945, 68 y.o.   MRN: [id number]  Reviewed patient medication, allergies, past medical history, family history and social history.  HPI  [name] is a 68 year old male who is here today for follow up for his DM, HTN and hyperlipidemia.  Smoke 5-6 cigarettes per day. Stopped drinking alcohol 3 months ago. He is taking all of his medications as prescribed.  Walks 10-15 minutes per day.   He denies loss of sensation in extremities, vision changes, headaches, edema, SOB, chest pain or palpitations.   Review of Systems As above     Objective:   Physical Exam  Constitutional: Vital signs are normal. He appears well-developed and well-nourished.  Eyes:  Fundoscopic exam:      The right eye shows no AV nicking, no exudate and no hemorrhage. The right eye shows red reflex.       The left eye shows no AV nicking, no exudate and no hemorrhage. The left eye shows red reflex.  Cardiovascular: Normal rate, regular rhythm, normal heart sounds and intact distal pulses.   Pulmonary/Chest: Effort normal and breath sounds normal.  Neurological: He is alert.  Skin:     Psychiatric: He has a normal mood and affect. His behavior is normal.   See diabetic foot exam - normal  Results for orders placed in visit on 02/10/13  GLUCOSE, POCT (MANUAL RESULT ENTRY)      Result Value Range   POC Glucose 124 (*) 70 - 99 mg/dl  POCT GLYCOSYLATED HEMOGLOBIN (HGB A1C)      Result Value Range   Hemoglobin A1C 6.8           Assessment & Plan:   1. Type II or unspecified type diabetes mellitus without mention of complication, not stated as uncontrolled Continue taking Metformin as prescribed.  - POCT glucose (manual entry) - POCT glycosylated hemoglobin (Hb A1C) - HM Diabetes Foot Exam - metFORMIN (GLUCOPHAGE) 1000 MG tablet; Take 1 tablet (1,000 mg total) by mouth 2 (two) times daily with a meal.  Dispense: 180 tablet; Refill:  1   2. Hyperlipidemia LDL goal <70 Continue taking Pravastatin as prescribed.  - Lipid panel - fenofibrate 160 MG tablet; Take 1 tablet (160 mg total) by mouth daily.  Dispense: 90 tablet; Refill: 1 - pravastatin (PRAVACHOL) 40 MG tablet; Take 1 tablet (40 mg total) by mouth every evening.  Dispense: 90 tablet; Refill: 1   3. Tinea pedis Apply cream to the foot 3 times a day.  - ketoconazole (NIZORAL) 2 % cream; Apply 1 application topically 3 (three) times daily. When rash is gone, stop using.  Dispense: 60 g; Refill: 1   4. HTN (hypertension)  - COMPLETE METABOLIC PANEL WITH GFR - Microalbumin, urine Continue taking amlopdipine, benazepril and HCTZ as prescribed.  - amLODipine (NORVASC) 10 MG tablet; Take 1 tablet (10 mg total) by mouth daily.  Dispense: 90 tablet; Refill: 1 - benazepril (LOTENSIN) 40 MG tablet; Take 1 tablet (40 mg total) by mouth daily.  Dispense: 90 tablet; Refill: 1 - hydrochlorothiazide (HYDRODIURIL) 25 MG tablet; Take 1 tablet (25 mg total) by mouth every morning.  Dispense: 90 tablet; Refill: 1   5. Need for shingles vaccine - zoster vaccine live, PF, (ZOSTAVAX) 99833 UNT/0.65ML injection; Inject 19,400 Units into the skin once.  Dispense: 0.65 mL; Refill: 0

## 2013-02-10 NOTE — Patient Instructions (Signed)
Keep up the great work. Exercise regularly. Take the prescription for Zostavax vaccine to the pharmacy.  They will give you the injection there.  This is to prevent shingles.

## 2013-02-10 NOTE — Progress Notes (Signed)
I have examined this patient along with the student and agree.  

## 2013-02-11 LAB — MICROALBUMIN, URINE: Microalb, Ur: 2.83 mg/dL — ABNORMAL HIGH (ref 0.00–1.89)

## 2013-02-13 ENCOUNTER — Encounter: Payer: Self-pay | Admitting: Physician Assistant

## 2013-02-23 ENCOUNTER — Encounter: Payer: Self-pay | Admitting: Physician Assistant

## 2013-02-23 DIAGNOSIS — B1920 Unspecified viral hepatitis C without hepatic coma: Secondary | ICD-10-CM

## 2013-03-16 ENCOUNTER — Encounter: Payer: Self-pay | Admitting: Physician Assistant

## 2013-03-16 DIAGNOSIS — N5201 Erectile dysfunction due to arterial insufficiency: Secondary | ICD-10-CM

## 2013-03-16 DIAGNOSIS — N401 Enlarged prostate with lower urinary tract symptoms: Principal | ICD-10-CM

## 2013-03-16 DIAGNOSIS — N138 Other obstructive and reflux uropathy: Secondary | ICD-10-CM

## 2013-06-02 ENCOUNTER — Ambulatory Visit: Payer: Medicare Other | Admitting: Physician Assistant

## 2013-06-30 ENCOUNTER — Ambulatory Visit (INDEPENDENT_AMBULATORY_CARE_PROVIDER_SITE_OTHER): Payer: Medicare Other | Admitting: Physician Assistant

## 2013-06-30 ENCOUNTER — Encounter: Payer: Self-pay | Admitting: Physician Assistant

## 2013-06-30 VITALS — BP 150/90 | HR 60 | Resp 18 | Ht 69.0 in | Wt 242.0 lb

## 2013-06-30 DIAGNOSIS — M545 Low back pain, unspecified: Secondary | ICD-10-CM

## 2013-06-30 DIAGNOSIS — E785 Hyperlipidemia, unspecified: Secondary | ICD-10-CM

## 2013-06-30 DIAGNOSIS — B1920 Unspecified viral hepatitis C without hepatic coma: Secondary | ICD-10-CM

## 2013-06-30 DIAGNOSIS — R809 Proteinuria, unspecified: Secondary | ICD-10-CM

## 2013-06-30 DIAGNOSIS — E1129 Type 2 diabetes mellitus with other diabetic kidney complication: Secondary | ICD-10-CM

## 2013-06-30 DIAGNOSIS — I10 Essential (primary) hypertension: Secondary | ICD-10-CM

## 2013-06-30 DIAGNOSIS — G8929 Other chronic pain: Secondary | ICD-10-CM

## 2013-06-30 DIAGNOSIS — Z23 Encounter for immunization: Secondary | ICD-10-CM

## 2013-06-30 LAB — COMPREHENSIVE METABOLIC PANEL
ALBUMIN: 4.1 g/dL (ref 3.5–5.2)
ALT: 17 U/L (ref 0–53)
AST: 20 U/L (ref 0–37)
Alkaline Phosphatase: 35 U/L — ABNORMAL LOW (ref 39–117)
BUN: 12 mg/dL (ref 6–23)
CALCIUM: 9 mg/dL (ref 8.4–10.5)
CHLORIDE: 106 meq/L (ref 96–112)
CO2: 22 mEq/L (ref 19–32)
CREATININE: 0.94 mg/dL (ref 0.50–1.35)
Glucose, Bld: 118 mg/dL — ABNORMAL HIGH (ref 70–99)
POTASSIUM: 4.2 meq/L (ref 3.5–5.3)
SODIUM: 139 meq/L (ref 135–145)
TOTAL PROTEIN: 7.2 g/dL (ref 6.0–8.3)
Total Bilirubin: 0.3 mg/dL (ref 0.2–1.2)

## 2013-06-30 LAB — LIPID PANEL
Cholesterol: 127 mg/dL (ref 0–200)
HDL: 22 mg/dL — ABNORMAL LOW (ref >39)
LDL Cholesterol: 83 mg/dL (ref 0–99)
Total CHOL/HDL Ratio: 5.8 Ratio
Triglycerides: 111 mg/dL (ref <150)
VLDL: 22 mg/dL (ref 0–40)

## 2013-06-30 LAB — POCT GLYCOSYLATED HEMOGLOBIN (HGB A1C): Hemoglobin A1C: 6.7

## 2013-06-30 LAB — GLUCOSE, POCT (MANUAL RESULT ENTRY): POC Glucose: 126 mg/dl — AB (ref 70–99)

## 2013-06-30 LAB — MICROALBUMIN, URINE: MICROALB UR: 7.33 mg/dL — AB (ref 0.00–1.89)

## 2013-06-30 MED ORDER — METFORMIN HCL 1000 MG PO TABS: 1000.0000 mg | ORAL_TABLET | Freq: Two times a day (BID) | ORAL | Status: DC

## 2013-06-30 MED ORDER — HYDROCODONE-ACETAMINOPHEN 7.5-325 MG PO TABS: 1.0000 | ORAL_TABLET | Freq: Four times a day (QID) | ORAL | Status: DC | PRN

## 2013-06-30 MED ORDER — BENAZEPRIL HCL 40 MG PO TABS: 40.0000 mg | ORAL_TABLET | Freq: Every day | ORAL | Status: DC

## 2013-06-30 MED ORDER — AMLODIPINE BESYLATE 10 MG PO TABS: 10.0000 mg | ORAL_TABLET | Freq: Every day | ORAL | Status: DC

## 2013-06-30 MED ORDER — PRAVASTATIN SODIUM 40 MG PO TABS: 40.0000 mg | ORAL_TABLET | Freq: Every evening | ORAL | Status: DC

## 2013-06-30 MED ORDER — FENOFIBRATE 160 MG PO TABS: 160.0000 mg | ORAL_TABLET | Freq: Every day | ORAL | Status: DC

## 2013-06-30 MED ORDER — HYDROCHLOROTHIAZIDE 25 MG PO TABS: 25.0000 mg | ORAL_TABLET | ORAL | Status: DC

## 2013-06-30 NOTE — Progress Notes (Signed)
Subjective:    Patient ID: Gregg Flores, male    DOB: [birth year], 68 y.o.   MRN: [id number]   PCP: Lashana Spang,Cherye Gaertner, PA-C  Chief Complaint  Patient presents with  . Hypertension    needs med refills pended    Medications, allergies, past medical history, surgical history, family history, social history and problem list reviewed and updated.  Patient Active Problem List   Diagnosis Date Noted  . Tobacco abuse 11/08/2012  . Hepatitis C 06/15/2012  . Need for hepatitis A vaccination 06/15/2012  . Hyperlipidemia LDL goal <70 04/06/2012  . Tobacco use 09/25/2011  . Essential hypertension, benign   . Chronic LBP   . Type 2 diabetes mellitus with renal manifestations   . Diverticulosis   . Benign prostatic hyperplasia with urinary obstruction   . Monoclonal gammopathy   . Cataracts, bilateral   . Erectile dysfunction due to arterial insufficiency   . Microalbuminuria   . Benign neoplasm of colon 07/01/2008     Prior to Admission medications   Medication Sig Start Date End Date Taking? Authorizing Provider  ipratropium (ATROVENT) 0.03 % nasal spray Place 2 sprays into the nose every 12 (twelve) hours. 04/06/12  Yes Sherian Valenza S Itzelle Gains, PA-C  ketoconazole (NIZORAL) 2 % cream Apply 1 application topically 3 (three) times daily. When rash is gone, stop using. 02/10/13  Yes Journi Moffa S Kden Wagster, PA-C  Ledipasvir-Sofosbuvir (HARVONI) 90-400 MG TABS Take by mouth.   Yes Historical Provider, MD  meloxicam (MOBIC) 15 MG tablet TAKE 1 TABLET BY MOUTH ONCE DAILY AS NEEDED FOR PAIN. DO  NOT USE WITH ALEVE 09/30/12  Yes Sareena Odeh S Amilyah Nack, PA-C  RA ASPIRIN ADULT LOW STRENGTH 81 MG EC tablet take 1 tablet by mouth once daily 09/30/12  Yes Armando Lauman S Jahki Witham, PA-C  amLODipine (NORVASC) 10 MG tablet Take 1 tablet (10 mg total) by mouth daily. 06/30/13   Eliceo Gladu S Shauntel Prest, PA-C  benazepril (LOTENSIN) 40 MG tablet Take 1 tablet (40 mg total) by mouth daily. 06/30/13   Buck Mcaffee S Larena Ohnemus, PA-C  fenofibrate 160 MG  tablet Take 1 tablet (160 mg total) by mouth daily. 06/30/13   Farah Benish S Anasofia Micallef, PA-C  hydrochlorothiazide (HYDRODIURIL) 25 MG tablet Take 1 tablet (25 mg total) by mouth every morning. 06/30/13   Maui Ahart Janalee Dane, PA-C  metFORMIN (GLUCOPHAGE) 1000 MG tablet Take 1 tablet (1,000 mg total) by mouth 2 (two) times daily with a meal. 06/30/13   Chanel Mckesson S Jeremaih Klima, PA-C  pravastatin (PRAVACHOL) 40 MG tablet Take 1 tablet (40 mg total) by mouth every evening. 06/30/13   Fara Chute, PA-C    HPI  Presents for follow-up of DM, HTN, lipids. I last saw him 6 months ago.  He hasn't received the shingles vaccine yet, the pharmacist told him "it was gonna be real expensive."  There is also some question about follow-up with the hepatitis clinic and whether or not his health insurance will cover it. He completed 12 weeks of Harvoni treatment.  The plan was to recheck HCV RNA and if negative, he'd be considered cured.  I'll plan to do that at his next visit if he can't get back into the Hepatitis clinic.  I also note that when he came in to get Hep B vaccine dose #3 and Hep A #2, he received only the Hep B dose.  Smokes about 1/2 ppd, trying to cut back. Occasionally drinks alcohol, "not like I used to." Very occasionally checks home glucose, 120-160. Last eye exam  fall 2014, next visit in 10/2013. Fully compensated edentula.  Review of Systems Denies chest pain, shortness of breath, HA, dizziness, vision change, nausea, vomiting, diarrhea, constipation, melena, hematochezia, dysuria, increased urinary urgency or frequency, increased hunger or thirst, unintentional weight change, unexplained myalgias or arthralgias, rash.  He has intermittent LBP, not new, from DJD.  Requests a refill of hydrocodone to use PRN.    Objective:   Physical Exam  Vitals reviewed. Constitutional: He is oriented to person, place, and time. Vital signs are normal. He appears well-developed and well-nourished. He is active and  cooperative. No distress.  BP 150/90  Pulse 60  Resp 18  Ht 5\' 9"  (1.753 m)  Wt 242 lb (109.77 kg)  BMI 35.72 kg/m2  SpO2 94%  HENT:  Head: Normocephalic and atraumatic.  Right Ear: Hearing normal.  Left Ear: Hearing normal.  Eyes: Conjunctivae are normal. No scleral icterus.  Neck: Normal range of motion. Neck supple. No thyromegaly present.  Cardiovascular: Normal rate, regular rhythm and normal heart sounds.   Pulses:      Radial pulses are 2+ on the right side, and 2+ on the left side.  Pulmonary/Chest: Effort normal and breath sounds normal.  Lymphadenopathy:       Head (right side): No tonsillar, no preauricular, no posterior auricular and no occipital adenopathy present.       Head (left side): No tonsillar, no preauricular, no posterior auricular and no occipital adenopathy present.    He has no cervical adenopathy.       Right: No supraclavicular adenopathy present.       Left: No supraclavicular adenopathy present.  Neurological: He is alert and oriented to person, place, and time. No sensory deficit.  Skin: Skin is warm, dry and intact. No rash noted. No cyanosis or erythema. Nails show no clubbing.  Psychiatric: He has a normal mood and affect.   See diabetic foot exam. Decreased sensation of the RIGHT great toe.  Thickened nails bilaterally.  Results for orders placed in visit on 06/30/13  GLUCOSE, POCT (MANUAL RESULT ENTRY)      Result Value Ref Range   POC Glucose 126 (*) 70 - 99 mg/dl  POCT GLYCOSYLATED HEMOGLOBIN (HGB A1C)      Result Value Ref Range   Hemoglobin A1C 6.7          Assessment & Plan:  1. HTN (hypertension) Above goal today, but he's possibly out of some of these medications. Encouraged smoking cessation. - amLODipine (NORVASC) 10 MG tablet; Take 1 tablet (10 mg total) by mouth daily.  Dispense: 90 tablet; Refill: 1 - benazepril (LOTENSIN) 40 MG tablet; Take 1 tablet (40 mg total) by mouth daily.  Dispense: 90 tablet; Refill: 1 -  hydrochlorothiazide (HYDRODIURIL) 25 MG tablet; Take 1 tablet (25 mg total) by mouth every morning.  Dispense: 90 tablet; Refill: 1  2. Hyperlipidemia LDL goal <70 Await lab results. - fenofibrate 160 MG tablet; Take 1 tablet (160 mg total) by mouth daily.  Dispense: 90 tablet; Refill: 1 - pravastatin (PRAVACHOL) 40 MG tablet; Take 1 tablet (40 mg total) by mouth every evening.  Dispense: 90 tablet; Refill: 1  3. Need for hepatitis A vaccination Dose #2 given today. - Hepatitis A vaccine adult IM  4. Microalbuminuria 5. Type 2 diabetes mellitus with renal manifestations Await labs.  Good control of DM presently. - metFORMIN (GLUCOPHAGE) 1000 MG tablet; Take 1 tablet (1,000 mg total) by mouth 2 (two) times daily with a meal.  Dispense:  180 tablet; Refill: 1 - POCT glucose (manual entry) - POCT glycosylated hemoglobin (Hb A1C) - Microalbumin, urine - Comprehensive metabolic panel - Lipid panel  6. Chronic LBP Pain usually controlled by meloxicam.  PRN hydrocodone. - HYDROcodone-acetaminophen (NORCO) 7.5-325 MG per tablet; Take 1 tablet by mouth every 6 (six) hours as needed.  Dispense: 30 tablet; Refill: 0  7. Need for vaccination with 13-polyvalent pneumococcal conjugate vaccine Pneumococcal vaccines are now complete. - Pneumococcal conjugate vaccine 13-valent IM  8. Hepatitis C Completed 12 weeks of Harvoni therapy.  If he cannot get back into the Hepatitis Clinic due to insurance coverage, I'll update the HCV RNA at his next visit with me.   Fara Chute, PA-C Physician Assistant-Certified [facility]

## 2013-06-30 NOTE — Patient Instructions (Addendum)
KEEP up the great work!  Hepatitis A Vaccine What You Need to Know WHAT IS HEPATITIS A?  Hepatitis A is a serious liver disease caused by the hepatitis A virus (HAV). HAV is found in the stool of persons with hepatitis A. It is usually spread by close personal contact and sometimes by eating food or drinking water containing HAV.  Hepatitis A can cause:  "Flu-like" illness.  Jaundice (yellow skin or eyes, dark urine).  Severe stomach pains and diarrhea (children).  People with hepatitis A often have to be hospitalized (up to about 1 person in 5).  Adults with hepatitis A are often too ill to work for up to a month.  Sometimes, people die as a result of hepatitis A (about 3 to 6 deaths per 1,000 cases).  A person who has hepatitis A can easily pass the disease to others within the same household.  Hepatitis A vaccine can prevent hepatitis A. WHO SHOULD GET HEPATITIS A VACCINE AND WHEN? WHO? Some people should be routinely vaccinated with hepatitis A vaccine:  All children between their first and second birthdays (58 through 26 months of age).  Anyone 1 year of age and older traveling to or working in countries with high or intermediate prevalence of hepatitis A, such as those located in [place] or [place], [place], [place] (except [place]), [place], and [place]. For more information see BlindResource.ca  Children and adolescents 2 through 77 years of age who live in states or communities where routine vaccination has been implemented because of high disease incidence.  Men who have sex with men.  People who use street drugs.  People with chronic liver disease.  People who are treated with clotting factor concentrates.  People who work with HAV-infected primates or who work with HAV in Therapist, art.  Members of households planning to adopt a child, or care for a newly arriving adopted child, from a country where hepatitis A is common. Other people might  get hepatitis A vaccine in special situations (ask your doctor for more details):  Unvaccinated children or adolescents in communities where outbreaks of hepatitis A are occurring.  Unvaccinated people who have been exposed to hepatitis A virus.  Anyone 1 year of age or older who wants protection from hepatitis A. Hepatitis A vaccine is not licensed for children younger than 1 year of age. WHEN?  For children, the first dose should be given at 63 through 42 months of age. Children who are not vaccinated by 74 years of age can be vaccinated at later visits.  For others at risk, the hepatitis A vaccine series may be started whenever a person wishes to be protected or is at risk of infection.  For travelers, it is best to start the vaccine series should be started at least 1 month before traveling. (Some protection may still result if the vaccine is given on or closer to the travel date.)  Some people who cannot get the vaccine before traveling, or for whom the vaccine might not be effective, can get a shot called immune globulin (IG). IG gives immediate, temporary protection.  Two doses of the vaccine are needed for lasting protection. These doses should be given at least 6 months apart.  Hepatitis A vaccine may be given at the same time as other vaccines. SOME PEOPLE SHOULD NOT GET HEPATITIS A VACCINE OR SHOULD WAIT.  Anyone who has ever had a severe (life-threatening) allergic reaction to a previous dose of hepatitis A vaccine should not  get another dose.  Anyone who has a severe (life threatening) allergy to any vaccine component should not get the vaccine. Tell your caregiver if you have any severe allergies, including a severe allergy to latex. All hepatitis A vaccines contain alum and some hepatitis A vaccines contain 2-phenoxyethanol.  Anyone who is moderately or severely ill at the time the shot is scheduled should probably wait until they recover. Ask your caregiver. People with a  mild illness can usually get the vaccine.  Tell your caregiver if you are pregnant. Because hepatitis A vaccine is inactivated (killed), the risk to a pregnant woman and her unborn baby is believed to be very low. But your doctor can weigh any theoretical risk from the vaccine against the need for protection. WHAT ARE THE RISKS FROM HEPATITIS A VACCINE?  A vaccine, like any medicine, could possibly cause serious problems, such as severe allergic reactions. The risk of hepatitis A vaccine causing serious harm, or death, is extremely small.  Getting hepatitis A vaccine is much safer than getting the disease. Mild problems  Soreness where the shot was given (about 1 out of 2 adults, and up to 1 out of 6 children).  Headache (about 1 out of 6 adults and 1 out of 25 children).  Loss of appetite (about 1 out of 12 children).  Tiredness (about 1 out of 14 adults). If these problems occur, they usually last 1 or 2 days. Severe problems  Serious allergic reaction, within a few minutes to a few hours of the shot (very rare). WHAT IF THERE IS A MODERATE OR SEVERE REACTION? What should I look for?  Any unusual condition, such as a high fever or unusual behavior. Signs of a serious allergic reaction can include difficulty breathing, hoarseness or wheezing, hives, paleness, weakness, a fast heartbeat, or dizziness. What should I do?  Call a doctor, or get the person to a doctor right away.  Tell the doctor what happened, the date and time it happened, and when the vaccination was given.  Ask the doctor, nurse, or health department to report the reaction by filing a Vaccine Adverse Event Reporting System (VAERS) form. Or, you can file this report through the VAERS website at www.vaers.SamedayNews.es or by calling (508)303-5197. VAERS does not provide medical advice. THE NATIONAL VACCINE INJURY COMPENSATION PROGRAM  The National Vaccine Injury Compensation Program (VICP) was created in 1986. Persons  who believe they may have been injured by a vaccine can learn about the program and about filing a claim by calling 234-315-2863 or visiting the [facility] website at GoldCloset.com.ee. HOW CAN I LEARN MORE?  Ask your doctor or nurse. They can give you the vaccine package insert or suggest other sources of information.  Call your local or state health department.  Contact the Centers for Disease Control and Prevention (CDC):  Call (671)477-1322 (1-800-CDC-INFO).  Visit CDC's website at: http://hunter.com/ CDC Hepatitis A Vaccine VIS (Interim) (11/22/09) Document Released: 11/09/2005 Document Revised: 04/09/2011 Document Reviewed: 05/06/2012 [facility] Patient Information 2014 [place]. Pneumococcal Conjugate Vaccine What You Need to Know Your doctor recommends that you, or your child, get a dose of PCV13 vaccine today. WHY GET VACCINATED? Pneumococcal conjugate vaccine (called PCV13 or Prevnar 13) is recommended to protect infants and toddlers, and some older children and adults with certain health conditions, from pneumococcal disease. Pneumococcal disease is caused by infection with Streptococcus pneumoniae bacteria. These bacteria can spread from person to person through close contact. Pneumococcal disease can lead to severe health problems,  including pneumonia, blood infections, and meningitis. Meningitis is an infection of the covering of the brain. Pneumococcal meningitis is fairly rare (less than 1 case per 100,000 people each year), but it leads to other health problems, including deafness and brain damage. In children, it is fatal in about 1 case out of 10. Children younger than two are at higher risk for serious disease than older children. People with certain medical conditions, people over age 11, and cigarette smokers are also at higher risk. Before vaccine, pneumococcal infections caused many problems each year in the [place] in children younger than 5,  including:  more than 700 cases of meningitis,  13,000 blood infections,  about 5 million ear infections, and  about 200 deaths. About 4,000 adults still die each year because of pneumococcal infections. Pneumococcal infections can be hard to treat because some strains are resistant to antibiotics. This makes prevention through vaccination even more important. PCV13 VACCINE There are more than 90 types of pneumococcal bacteria. PCV13 protects against 13 of them. These 13 strains cause most severe infections in children and about half of infections in adults.  PCV13 is routinely given to children at 2, 4, 6, and 78 3 months of age. Children in this age range are at greatest risk for serious diseases caused by pneumococcal infection. PCV13 vaccine may also be recommended for some older children or adults. Your doctor can give you details. A second type of pneumococcal vaccine, called PPSV23, may also be given to some children and adults, including anyone over age 41. There is a separate Vaccine Information Statement for this vaccine. PRECAUTIONS  Anyone who has ever had a life-threatening allergic reaction to a dose of this vaccine, to an earlier pneumococcal vaccine called PCV7 (or Prevnar), or to any vaccine containing diphtheria toxoid (for example, DTaP), should not get PCV13. Anyone with a severe allergy to any component of PCV13 should not get the vaccine. Tell your doctor if the person being vaccinated has any severe allergies. If the person scheduled for vaccination is sick, your doctor might decide to reschedule the shot on another day. Your doctor can give you more information about any of these precautions. RISKS  With any medicine, including vaccines, there is a chance of side effects. These are usually mild and go away on their own, but serious reactions are also possible. Reported problems associated with PCV13 vary by dose and age, but generally:  About half of children became  drowsy after the shot, had a temporary loss of appetite, or had redness or tenderness where the shot was given.  About 1 out of 3 had swelling where the shot was given.  About 1 out of 3 had a mild fever, and about 1 in 20 had a higher fever (over 102.2 F or 39 C).  Up to about 8 out of 10 became fussy or irritable. Adults receiving the vaccine have reported redness, pain, and swelling where the shot was given. Mild fever, fatigue, headache, chills, or muscle pain have also been reported. Life-threatening allergic reactions from any vaccine are very rare. WHAT IF THERE IS A SERIOUS REACTION? What should I look for? Look for anything that concerns you, such as signs of a severe allergic reaction, very high fever, or behavior changes. Signs of a severe allergic reaction can include hives, swelling of the face and throat, difficulty breathing, a fast heartbeat, dizziness, and weakness. These would start a few minutes to a few hours after the vaccination. What should I  do?  If you think it is a severe allergic reaction or other emergency that can't wait, get the person to the nearest hospital or call 9-1-1. Otherwise, call your doctor.  Afterward, the reaction should be reported to the "Vaccine Adverse Event Reporting System" (VAERS). Your doctor might file this report, or you can do it yourself through the VAERS web site at www.vaers.SamedayNews.es, or by calling 870-251-7504. VAERS is only for reporting reactions. They do not give medical advice. THE NATIONAL VACCINE INJURY COMPENSATION PROGRAM The National Vaccine Injury Compensation Program (VICP) was created in 1986. Persons who believe they may have been injured by a vaccine can learn about the program and about filing a claim by calling 9103276570 or visiting the [facility] website at GoldCloset.com.ee. HOW CAN I LEARN MORE?  Ask your doctor.  Call your local or state health department.  Contact the Centers for Disease  Control and Prevention (CDC):  Call 6267992624 (1-800-CDC-INFO) or  Visit CDC's website at http://hunter.com/ CDC PCV13 Vaccine VIS (Interim) (03/28/11) Document Released: 11/12/2005 Document Revised: 05/12/2012 Document Reviewed: 05/07/2012 [facility] Patient Information 2014 [place].

## 2013-06-30 NOTE — Progress Notes (Signed)
Phone call to pharmacy, regarding the shingles vaccine, price for patient will be $210, since he has not met deductible yet, pharmacy indicates they have advised him to wait until later this year, and have this done, once he has met his deductible.

## 2013-07-02 ENCOUNTER — Encounter: Payer: Self-pay | Admitting: Physician Assistant

## 2013-08-12 ENCOUNTER — Telehealth: Payer: Self-pay | Admitting: *Deleted

## 2013-08-12 NOTE — Telephone Encounter (Signed)
Called to schedule follow up appointment for Diabetes Maintenance. Left message.

## 2013-08-13 ENCOUNTER — Encounter: Payer: Self-pay | Admitting: *Deleted

## 2013-08-14 ENCOUNTER — Telehealth: Payer: Self-pay | Admitting: Radiology

## 2013-08-14 NOTE — Telephone Encounter (Signed)
Noted, and put in appt notes so we will all be aware.

## 2013-08-14 NOTE — Telephone Encounter (Signed)
Message copied by Candice Camp on Fri Aug 14, 2013 10:29 AM ------      Message from: Ulice Dash S      Created: Wed Aug 12, 2013  3:52 PM      Regarding: HQPAF       Gregg Flores has an OV scheduled for 11/24/13 @ 0815 with Chelle.  I've got all the documentation I need except for the following:            EARLY DETECTION           AAA screening           Urinary Incontinence            MANAGING CHRONIC ILLNESS           Diabetic Eye Exam (last recorded 11/21/11 with [facility]-if           they do not provide a report during OV, I'll contact opth)            I've copied and placed PAF in both yours & Amy's box.            Thank you,      Ulice Dash, RN ------

## 2013-10-07 ENCOUNTER — Encounter: Payer: Self-pay | Admitting: Physician Assistant

## 2013-10-07 ENCOUNTER — Telehealth: Payer: Self-pay | Admitting: *Deleted

## 2013-10-07 NOTE — Telephone Encounter (Signed)
Spoke with Alli at [facility], she is faxing a copy of patient's 10/2012 diabetic eye exam and patient also has OV scheduled with them for 10/15/13 @ 0830.

## 2013-10-13 ENCOUNTER — Ambulatory Visit: Payer: Medicare Other | Admitting: Physician Assistant

## 2013-11-06 ENCOUNTER — Encounter: Payer: Self-pay | Admitting: Hematology

## 2013-11-06 ENCOUNTER — Other Ambulatory Visit (HOSPITAL_BASED_OUTPATIENT_CLINIC_OR_DEPARTMENT_OTHER): Payer: Medicare Other

## 2013-11-06 ENCOUNTER — Telehealth: Payer: Self-pay | Admitting: Hematology

## 2013-11-06 ENCOUNTER — Ambulatory Visit (HOSPITAL_BASED_OUTPATIENT_CLINIC_OR_DEPARTMENT_OTHER): Payer: Medicare Other | Admitting: Hematology

## 2013-11-06 VITALS — BP 127/73 | HR 59 | Temp 98.1°F | Resp 18 | Ht 69.0 in | Wt 236.5 lb

## 2013-11-06 DIAGNOSIS — B1920 Unspecified viral hepatitis C without hepatic coma: Secondary | ICD-10-CM | POA: Diagnosis not present

## 2013-11-06 DIAGNOSIS — Z720 Tobacco use: Secondary | ICD-10-CM | POA: Diagnosis not present

## 2013-11-06 DIAGNOSIS — D472 Monoclonal gammopathy: Secondary | ICD-10-CM | POA: Diagnosis not present

## 2013-11-06 LAB — CBC WITH DIFFERENTIAL/PLATELET
BASO%: 0.7 % (ref 0.0–2.0)
Basophils Absolute: 0 10*3/uL (ref 0.0–0.1)
EOS ABS: 0.1 10*3/uL (ref 0.0–0.5)
EOS%: 1.4 % (ref 0.0–7.0)
HCT: 41.7 % (ref 38.4–49.9)
HEMOGLOBIN: 13.4 g/dL (ref 13.0–17.1)
LYMPH%: 40.7 % (ref 14.0–49.0)
MCH: 27.8 pg (ref 27.2–33.4)
MCHC: 32.1 g/dL (ref 32.0–36.0)
MCV: 86.4 fL (ref 79.3–98.0)
MONO#: 0.5 10*3/uL (ref 0.1–0.9)
MONO%: 8.6 % (ref 0.0–14.0)
NEUT%: 48.6 % (ref 39.0–75.0)
NEUTROS ABS: 2.8 10*3/uL (ref 1.5–6.5)
PLATELETS: 248 10*3/uL (ref 140–400)
RBC: 4.82 10*6/uL (ref 4.20–5.82)
RDW: 13.2 % (ref 11.0–14.6)
WBC: 5.7 10*3/uL (ref 4.0–10.3)
lymph#: 2.3 10*3/uL (ref 0.9–3.3)

## 2013-11-06 LAB — COMPREHENSIVE METABOLIC PANEL (CC13)
ALBUMIN: 3.6 g/dL (ref 3.5–5.0)
ALT: 15 U/L (ref 0–55)
AST: 17 U/L (ref 5–34)
Alkaline Phosphatase: 39 U/L — ABNORMAL LOW (ref 40–150)
Anion Gap: 9 mEq/L (ref 3–11)
BUN: 14.2 mg/dL (ref 7.0–26.0)
CO2: 24 meq/L (ref 22–29)
Calcium: 10 mg/dL (ref 8.4–10.4)
Chloride: 107 mEq/L (ref 98–109)
Creatinine: 1.1 mg/dL (ref 0.7–1.3)
Glucose: 105 mg/dl (ref 70–140)
Potassium: 3.9 mEq/L (ref 3.5–5.1)
SODIUM: 140 meq/L (ref 136–145)
TOTAL PROTEIN: 7.6 g/dL (ref 6.4–8.3)
Total Bilirubin: 0.37 mg/dL (ref 0.20–1.20)

## 2013-11-06 MED ORDER — NICOTINE 14 MG/24HR TD PT24: 14.0000 mg | MEDICATED_PATCH | Freq: Every day | TRANSDERMAL | Status: DC

## 2013-11-06 NOTE — Progress Notes (Signed)
[facility] OFF[facility] PROGRESS NOTE DATE OF VISIT: 11/06/2013  JEFFERY,CHELLE, PA-C [address] [place] [address]  DIAGNOSIS: MGUS (monoclonal gammopathy of unknown significance) - Plan: CBC with Differential, Comprehensive metabolic panel (Cmet) - [facility], SPEP & IFE with QIG, Kappa/lambda light chains  Chief Complaint  Patient presents with  . Follow-up    CURRENT THERAPY:  Observation.   INTERVAL HISTORY:  JAMEEL QUANT 68 y.o. 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  He was last seen by Dr. Juliann Mule on 11/06/12 and comes for annual check up.  He denies any hospitalizations or emergency room visits.  His weight is stable with a good appetite.  He reports that he he had a liver biopsy in June 2014 and was told it was fine.  He has a history of hepatitis C which is being managed by hepatology.  He drinks alcohol 2-3 times a week.  He smokes about 1 ppd.  He denies any constitutional symptoms including fevers or chills or acute shortness of breath.   He states he wants to quit smoking but acn't use nicorette gums because of dentures, would like to try nicotine patches which i called in.  MEDICAL HISTORY: Past Medical History  Diagnosis Date  . Essential hypertension, benign 1990  . Chronic LBP   . Type II or unspecified type diabetes mellitus without mention of complication, not stated as uncontrolled   . Diverticulosis   . Colon polyps   . BPH (benign prostatic hyperplasia)   . Monoclonal gammopathy 04/2008  . Cataracts, bilateral   . ED (erectile dysfunction)   . Microalbuminuria     INTERIM HISTORY: has Benign neoplasm of colon; Essential hypertension, benign; Chronic LBP; Type 2 diabetes mellitus with renal manifestations; Diverticulosis; Benign prostatic hyperplasia with urinary obstruction; Monoclonal gammopathy; Cataracts, bilateral; Erectile dysfunction due to arterial  insufficiency; Microalbuminuria; Tobacco use; Hyperlipidemia LDL goal <70; Hepatitis C; Need for hepatitis A vaccination; and Tobacco abuse on his problem list.    ALLERGIES:  has No Known Allergies.  MEDICATIONS: has a current medication list which includes the following prescription(s): amlodipine, benazepril, fenofibrate, hydrochlorothiazide, hydrocodone-acetaminophen, ipratropium, ledipasvir-sofosbuvir, meloxicam, metformin, pravastatin, ra aspirin adult low strength, nicotine, and zoster vaccine live (pf).  SURGICAL HISTORY:  Past Surgical History  Procedure Laterality Date  . Transurethral resection of prostate      REVIEW OF SYSTEMS:   Constitutional: Denies fevers, chills or abnormal weight loss Eyes: Denies blurriness of vision Ears, nose, mouth, throat, and face: Denies mucositis or sore throat Respiratory: Denies cough, dyspnea or wheezes Cardiovascular: Denies palpitation, chest discomfort or lower extremity swelling Gastrointestinal:  Denies nausea, heartburn or change in bowel habits Skin: Denies abnormal skin rashes Lymphatics: Denies new lymphadenopathy or easy bruising Neurological:Denies numbness, tingling or new weaknesses Behavioral/Psych: Mood is stable, no new changes  All other systems were reviewed with the patient and are negative.  PHYSICAL EXAMINATION: ECOG PERFORMANCE STATUS: 0  Blood pressure 127/73, pulse 59, temperature 98.1 F (36.7 C), temperature source Oral, resp. rate 18, height _0  (1.753 m), weight 236 lb 8 oz (107.276 kg).  GENERAL:alert, no distress and comfortable SKIN: skin color, texture, turgor are normal, no rashes or significant lesions EYES: normal, Conjunctiva are pink and non-injected, sclera clear OROPHARYNX:no exudate, no erythema and lips, buccal mucosa, and tongue normal  NECK: supple, thyroid normal size, non-tender, without nodularity LYMPH:  no palpable lymphadenopathy in the cervical, axillary or  supraclavicular LUNGS:  clear to auscultation and percussion with normal breathing effort HEART: regular rate & rhythm and no murmurs and no lower extremity edema ABDOMEN:abdomen soft, non-tender and normal bowel sounds; moderately obese without stigmata of liver disease Musculoskeletal:no cyanosis of digits and no clubbing  NEURO: alert & oriented x 3 with fluent speech, no focal motor/sensory deficits   LABORATORY DATA: Results for orders placed in visit on 11/06/13 (from the past 48 hour(s))  CBC WITH DIFFERENTIAL     Status: None   Collection Time    11/06/13  7:59 AM      Result Value Ref Range   WBC 5.7  4.0 - 10.3 10e3/uL   NEUT# 2.8  1.5 - 6.5 10e3/uL   HGB 13.4  13.0 - 17.1 g/dL   HCT 41.7  38.4 - 49.9 %   Platelets 248  140 - 400 10e3/uL   MCV 86.4  79.3 - 98.0 fL   MCH 27.8  27.2 - 33.4 pg   MCHC 32.1  32.0 - 36.0 g/dL   RBC 4.82  4.20 - 5.82 10e6/uL   RDW 13.2  11.0 - 14.6 %   lymph# 2.3  0.9 - 3.3 10e3/uL   MONO# 0.5  0.1 - 0.9 10e3/uL   Eosinophils Absolute 0.1  0.0 - 0.5 10e3/uL   Basophils Absolute 0.0  0.0 - 0.1 10e3/uL   NEUT% 48.6  39.0 - 75.0 %   LYMPH% 40.7  14.0 - 49.0 %   MONO% 8.6  0.0 - 14.0 %   EOS% 1.4  0.0 - 7.0 %   BASO% 0.7  0.0 - 2.0 %  COMPREHENSIVE METABOLIC PANEL (ZH29)     Status: Abnormal   Collection Time    11/06/13  7:59 AM      Result Value Ref Range   Sodium 140  136 - 145 mEq/L   Potassium 3.9  3.5 - 5.1 mEq/L   Chloride 107  98 - 109 mEq/L   CO2 24  22 - 29 mEq/L   Glucose 105  70 - 140 mg/dl   BUN 14.2  7.0 - 26.0 mg/dL   Creatinine 1.1  0.7 - 1.3 mg/dL   Total Bilirubin 0.37  0.20 - 1.20 mg/dL   Alkaline Phosphatase 39 (*) 40 - 150 U/L   AST 17  5 - 34 U/L   ALT 15  0 - 55 U/L   Total Protein 7.6  6.4 - 8.3 g/dL   Albumin 3.6  3.5 - 5.0 g/dL   Calcium 10.0  8.4 - 10.4 mg/dL   Anion Gap 9  3 - 11 mEq/L       Labs:  Lab Results  Component Value Date   WBC 5.7 11/06/2013   HGB 13.4 11/06/2013   HCT 41.7 11/06/2013   MCV 86.4 11/06/2013   PLT  248 11/06/2013   NEUTROABS 2.8 11/06/2013      Chemistry      Component Value Date/Time   NA 140 11/06/2013 0759   NA 139 06/30/2013 0834   NA 137 12/19/2010   K 3.9 11/06/2013 0759   K 4.2 06/30/2013 0834   CL 106 06/30/2013 0834   CL 104 09/27/2011 1028   CO2 24 11/06/2013 0759   CO2 22 06/30/2013 0834   BUN 14.2 11/06/2013 0759   BUN 12 06/30/2013 0834   BUN 17 12/19/2010   CREATININE 1.1 11/06/2013 0759   CREATININE 0.94 06/30/2013 0834   CREATININE 1.27 03/29/2011 1010   CREATININE  0.9 12/19/2010   GLU 114 12/19/2010      Component Value Date/Time   CALCIUM 10.0 11/06/2013 0759   CALCIUM 9.0 06/30/2013 0834   ALKPHOS 39* 11/06/2013 0759   ALKPHOS 35* 06/30/2013 0834   AST 17 11/06/2013 0759   AST 20 06/30/2013 0834   ALT 15 11/06/2013 0759   ALT 17 06/30/2013 0834   BILITOT 0.37 11/06/2013 0759   BILITOT 0.3 06/30/2013 0834       RADIOGRAPHIC STUDIES: Clinical Data: Hepatitis C, hepatoma surveillance LIMITED ABDOMINAL ULTRASOUND - RIGHT UPPER QUADRANT  Comparison: None. Findings: Gallbladder: The gallbladder is well visualized and no gallstones are seen. There is no pain over the gallbladder with compression. Common bile duct: The common bile duct is normal measuring 2.9 mm in diameter  Liver: The liver is diffusely echogenic, consistent with fatty infiltration. No focal abnormality is seen. IMPRESSION:  1. Very echogenic liver parenchyma consistent with diffuse fatty infiltration. No focal hepatic lesion. 2. No gallstones.    ASSESSMENT: DEMETRUS PAVAO 68 y.o. male with a history of MGUS (monoclonal gammopathy of unknown significance) - Plan: CBC with Differential, Comprehensive metabolic panel (Cmet) - [facility], SPEP & IFE with QIG, Kappa/lambda light chains   PLAN: 1. IgG kappa monoclonal gammopathy of uncertain significance. --We provided Mr. Tetzloff a detailed discussion about MGUS and its risk to progression to multiple myeloma (1% per year).  We will check quantitative immunoglobulins in  6 months and plant to see him again in 1 year at which time we will check CBC, chemistries, and quantitative immunoglobulins.  If it appears that his IgG level is increasing then we may want to do some additional studies which might include urine collection and immunofixation electrophoresis, metastatic bone survey, and bone marrow exam. His MGUS markers are pending now except cbc which is normal and we will call him if abnormal  2. Hepatitis C. --He is receiving his hepatitis A, B vaccination.  Continue hepatoma surveillance and treatment for his hepatitis C.  Last ultrasound (noted above) in July 2014.    3. Tobacco Abuse. -- I urged him to quit smoking and he declines tobacco smoking cessation classes. I called nicotine patches for him.  All questions were answered. The patient knows to call the clinic with any problems, questions or concerns. We can certainly see the patient much sooner if necessary.  I spent 15 minutes counseling the patient face to face. The total time spent in the appointment was 25 minutes.    Bernadene Bell, MD Medical Hematologist/Oncologist [place] Pager: [phone] Office No: [phone]

## 2013-11-06 NOTE — Telephone Encounter (Signed)
gv adn printed appt sched and avs for pt fro OCT 2016

## 2013-11-10 LAB — SPEP & IFE WITH QIG
Albumin ELP: 51.3 % — ABNORMAL LOW (ref 55.8–66.1)
Alpha-1-Globulin: 7.3 % — ABNORMAL HIGH (ref 2.9–4.9)
Alpha-2-Globulin: 11.7 % (ref 7.1–11.8)
BETA GLOBULIN: 7.6 % — AB (ref 4.7–7.2)
Beta 2: 4 % (ref 3.2–6.5)
Gamma Globulin: 18.1 % (ref 11.1–18.8)
IGA: 245 mg/dL (ref 68–379)
IGG (IMMUNOGLOBIN G), SERUM: 1270 mg/dL (ref 650–1600)
IgM, Serum: 51 mg/dL (ref 41–251)
TOTAL PROTEIN, SERUM ELECTROPHOR: 7.3 g/dL (ref 6.0–8.3)

## 2013-11-10 LAB — KAPPA/LAMBDA LIGHT CHAINS
Kappa free light chain: 3.16 mg/dL — ABNORMAL HIGH (ref 0.33–1.94)
Kappa:Lambda Ratio: 1.4 (ref 0.26–1.65)
Lambda Free Lght Chn: 2.25 mg/dL (ref 0.57–2.63)

## 2013-11-24 ENCOUNTER — Ambulatory Visit: Payer: Medicare Other | Admitting: Physician Assistant

## 2013-12-07 ENCOUNTER — Other Ambulatory Visit: Payer: Self-pay | Admitting: Physician Assistant

## 2014-01-14 LAB — HM DIABETES EYE EXAM

## 2014-01-20 ENCOUNTER — Encounter: Payer: Self-pay | Admitting: Physician Assistant

## 2014-01-25 ENCOUNTER — Telehealth: Payer: Self-pay

## 2014-01-25 NOTE — Telephone Encounter (Signed)
Pt called requesting a refill on amlodipine, pravastatin,fenofibrate and hydrocidone Pt use [facility] on [address] Please call pt at [phone]

## 2014-01-26 MED ORDER — FENOFIBRATE 160 MG PO TABS: ORAL_TABLET | ORAL | Status: DC

## 2014-01-26 MED ORDER — PRAVASTATIN SODIUM 40 MG PO TABS: ORAL_TABLET | ORAL | Status: DC

## 2014-01-26 MED ORDER — AMLODIPINE BESYLATE 10 MG PO TABS: ORAL_TABLET | ORAL | Status: DC

## 2014-01-26 NOTE — Telephone Encounter (Signed)
#  30 refill sent- Pt needs to RTC for further refills.  Transferred pt to schedule an appt.

## 2014-02-09 ENCOUNTER — Ambulatory Visit (INDEPENDENT_AMBULATORY_CARE_PROVIDER_SITE_OTHER): Payer: Medicare Other | Admitting: Physician Assistant

## 2014-02-09 VITALS — BP 132/78 | HR 73 | Temp 98.0°F | Resp 18 | Ht 70.0 in | Wt 239.0 lb

## 2014-02-09 DIAGNOSIS — E785 Hyperlipidemia, unspecified: Secondary | ICD-10-CM

## 2014-02-09 DIAGNOSIS — Z720 Tobacco use: Secondary | ICD-10-CM

## 2014-02-09 DIAGNOSIS — N189 Chronic kidney disease, unspecified: Secondary | ICD-10-CM | POA: Diagnosis not present

## 2014-02-09 DIAGNOSIS — D472 Monoclonal gammopathy: Secondary | ICD-10-CM

## 2014-02-09 DIAGNOSIS — G629 Polyneuropathy, unspecified: Secondary | ICD-10-CM

## 2014-02-09 DIAGNOSIS — I10 Essential (primary) hypertension: Secondary | ICD-10-CM | POA: Diagnosis not present

## 2014-02-09 DIAGNOSIS — E1342 Other specified diabetes mellitus with diabetic polyneuropathy: Secondary | ICD-10-CM

## 2014-02-09 DIAGNOSIS — L602 Onychogryphosis: Secondary | ICD-10-CM

## 2014-02-09 DIAGNOSIS — E1122 Type 2 diabetes mellitus with diabetic chronic kidney disease: Secondary | ICD-10-CM | POA: Diagnosis not present

## 2014-02-09 DIAGNOSIS — E1142 Type 2 diabetes mellitus with diabetic polyneuropathy: Secondary | ICD-10-CM

## 2014-02-09 DIAGNOSIS — G8929 Other chronic pain: Secondary | ICD-10-CM

## 2014-02-09 DIAGNOSIS — N5201 Erectile dysfunction due to arterial insufficiency: Secondary | ICD-10-CM

## 2014-02-09 DIAGNOSIS — M545 Low back pain, unspecified: Secondary | ICD-10-CM

## 2014-02-09 LAB — COMPREHENSIVE METABOLIC PANEL
ALBUMIN: 4.2 g/dL (ref 3.5–5.2)
ALK PHOS: 42 U/L (ref 39–117)
ALT: 14 U/L (ref 0–53)
AST: 20 U/L (ref 0–37)
BUN: 18 mg/dL (ref 6–23)
CO2: 25 meq/L (ref 19–32)
Calcium: 9.9 mg/dL (ref 8.4–10.5)
Chloride: 103 mEq/L (ref 96–112)
Creat: 1.05 mg/dL (ref 0.50–1.35)
Glucose, Bld: 103 mg/dL — ABNORMAL HIGH (ref 70–99)
POTASSIUM: 4.5 meq/L (ref 3.5–5.3)
SODIUM: 137 meq/L (ref 135–145)
Total Bilirubin: 0.4 mg/dL (ref 0.2–1.2)
Total Protein: 7.9 g/dL (ref 6.0–8.3)

## 2014-02-09 LAB — POCT CBC
Granulocyte percent: 46.3 %G (ref 37–80)
HCT, POC: 44 % (ref 43.5–53.7)
HEMOGLOBIN: 13.9 g/dL — AB (ref 14.1–18.1)
Lymph, poc: 2.4 (ref 0.6–3.4)
MCH, POC: 27.8 pg (ref 27–31.2)
MCHC: 31.6 g/dL — AB (ref 31.8–35.4)
MCV: 88 fL (ref 80–97)
MID (cbc): 0.4 (ref 0–0.9)
MPV: 6.8 fL (ref 0–99.8)
POC GRANULOCYTE: 2.4 (ref 2–6.9)
POC LYMPH PERCENT: 46.7 %L (ref 10–50)
POC MID %: 7 % (ref 0–12)
Platelet Count, POC: 251 10*3/uL (ref 142–424)
RBC: 4.99 M/uL (ref 4.69–6.13)
RDW, POC: 13.5 %
WBC: 5.2 10*3/uL (ref 4.6–10.2)

## 2014-02-09 LAB — LIPID PANEL
Cholesterol: 153 mg/dL (ref 0–200)
HDL: 24 mg/dL — AB (ref >39)
LDL Cholesterol: 103 mg/dL — ABNORMAL HIGH (ref 0–99)
Total CHOL/HDL Ratio: 6.4 Ratio
Triglycerides: 131 mg/dL (ref <150)
VLDL: 26 mg/dL (ref 0–40)

## 2014-02-09 LAB — GLUCOSE, POCT (MANUAL RESULT ENTRY): POC Glucose: 115 mg/dl — AB (ref 70–99)

## 2014-02-09 LAB — MICROALBUMIN, URINE: Microalb, Ur: 6.1 mg/dL — ABNORMAL HIGH (ref <2.0)

## 2014-02-09 LAB — POCT GLYCOSYLATED HEMOGLOBIN (HGB A1C): Hemoglobin A1C: 6.9

## 2014-02-09 MED ORDER — PRAVASTATIN SODIUM 40 MG PO TABS: ORAL_TABLET | ORAL | Status: DC

## 2014-02-09 MED ORDER — HYDROCODONE-ACETAMINOPHEN 7.5-325 MG PO TABS: 1.0000 | ORAL_TABLET | Freq: Four times a day (QID) | ORAL | Status: DC | PRN

## 2014-02-09 MED ORDER — BENAZEPRIL HCL 40 MG PO TABS: ORAL_TABLET | ORAL | Status: DC

## 2014-02-09 MED ORDER — FENOFIBRATE 160 MG PO TABS: ORAL_TABLET | ORAL | Status: DC

## 2014-02-09 MED ORDER — METFORMIN HCL 1000 MG PO TABS: 1000.0000 mg | ORAL_TABLET | Freq: Two times a day (BID) | ORAL | Status: DC

## 2014-02-09 MED ORDER — MELOXICAM 15 MG PO TABS: ORAL_TABLET | ORAL | Status: DC

## 2014-02-09 MED ORDER — HYDROCHLOROTHIAZIDE 25 MG PO TABS: ORAL_TABLET | ORAL | Status: DC

## 2014-02-09 MED ORDER — AMLODIPINE BESYLATE 10 MG PO TABS: ORAL_TABLET | ORAL | Status: DC

## 2014-02-09 NOTE — Patient Instructions (Signed)
Keep working on healthy eating and getting regular exercise. Try walking, for a total of 150 minutes each week. It doesn't have to be a lot all at once, but needs to total 150 minutes in 7 days. Keep working on quitting smoking. I know that you can do it!

## 2014-02-09 NOTE — Progress Notes (Signed)
Subjective:    Patient ID: Gregg Flores, male    DOB: March 21, 1945, 69 y.o.   MRN: [id number]   PCP: Bernie Ransford, PA-C  Chief Complaint  Patient presents with  . rx refills    all meds listed in epic     No Known Allergies  Patient Active Problem List   Diagnosis Date Noted  . Hepatitis C 06/15/2012  . Hyperlipidemia LDL goal <70 04/06/2012  . Tobacco use 09/25/2011  . Essential hypertension, benign   . Chronic LBP   . Type 2 diabetes mellitus with renal manifestations   . Diverticulosis   . Benign prostatic hyperplasia with urinary obstruction   . Monoclonal gammopathy   . Cataracts, bilateral   . Erectile dysfunction due to arterial insufficiency   . Microalbuminuria   . Benign neoplasm of colon 07/01/2008    Prior to Admission medications   Medication Sig Start Date End Date Taking? Authorizing Provider  amLODipine (NORVASC) 10 MG tablet TAKE 1 TABLET BY MOUTH ONCE DAILY.  "NEEDS OFFICE VISIT FOR ADDITIONAL REFILLS" 01/26/14  Yes Mancel Bale, PA-C  benazepril (LOTENSIN) 40 MG tablet TAKE 1 TABLET BY MOUTH ONCE DAILY  "NEEDS OFFICE VISIT FOR ADDITIONAL REFILLS" 12/07/13  Yes Maelie Chriswell S Yazeed Pryer, PA-C  fenofibrate 160 MG tablet TAKE 1 TABLET BY MOUTH ONCE DAILY.  "NEEDS OFFICE VISIT FOR ADDITIONAL REFILLS" 01/26/14  Yes Mancel Bale, PA-C  hydrochlorothiazide (HYDRODIURIL) 25 MG tablet TAKE 1 TABLET BY MOUTH EVERY MORNING  "NEEDS OFFICE VISIT FOR ADDITIONAL REFILLS" 12/07/13  Yes Carla Rashad S Geraldene Eisel, PA-C  ipratropium (ATROVENT) 0.03 % nasal spray Place 2 sprays into the nose every 12 (twelve) hours. 04/06/12  Yes Yina Riviere S Aryanah Enslow, PA-C  meloxicam (MOBIC) 15 MG tablet TAKE 1 TABLET BY MOUTH ONCE DAILY AS NEEDED FOR PAIN. DO  NOT USE WITH ALEVE 09/30/12  Yes Makaia Rappa S Jacqueline Spofford, PA-C  metFORMIN (GLUCOPHAGE) 1000 MG tablet Take 1 tablet (1,000 mg total) by mouth 2 (two) times daily with a meal. 06/30/13  Yes Amalea Ottey S Dinh Ayotte, PA-C  nicotine ([employer] NICOTINE TRANSDERMAL SYS) 14 mg/24hr  patch Place 1 patch (14 mg total) onto the skin daily. 11/06/13  Yes Aasim Marla Roe, MD  pravastatin (PRAVACHOL) 40 MG tablet TAKE 1 TABLET BY MOUTH EVERY EVENING .  "NEEDS OFFICE VISIT FOR ADDITIONAL REFILLS" 01/26/14  Yes Mancel Bale, PA-C  RA ASPIRIN ADULT LOW STRENGTH 81 MG EC tablet take 1 tablet by mouth once daily 09/30/12  Yes Fara Chute, PA-C    Medical, Surgical, Family and Social History reviewed and updated.  HPI  Ulysse presents for routine follow-up of DM, HTN, elevated lipids. I haven't seen him since June, due to scheduling conflicts and he's had much frustration with getting his prescriptions refilled. He ran out of hydrodiuril several days ago.  To see Dr. Risa Grill next month.  Frequency of home glucose monitoring: only occasionally; 126-180-190; can tell when it gets low because he gets hungry. Sees eye specialist annually (01/18/2014). Full edentula. Checks feet daily. Is current with influenza vaccine. Is current with pneumococcal vaccine, both 23 and 13-valent.   Review of Systems Denies chest pain, shortness of breath, HA, dizziness, vision change, nausea, vomiting, diarrhea, constipation, melena, hematochezia, dysuria, increased urinary urgency or frequency, increased hunger or thirst, unintentional weight change, unexplained myalgias or arthralgias, rash.  Only occasional LBP and knee pain for which he uses hydrocodone.    Objective:   Physical Exam  Constitutional: He is oriented to person, place,  and time. Vital signs are normal. He appears well-developed and well-nourished. He is active and cooperative. No distress.  BP 132/78 mmHg  Pulse 73  Temp(Src) 98 F (36.7 C) (Oral)  Resp 18  Ht 5\' 10"  (1.778 m)  Wt 239 lb (108.41 kg)  BMI 34.29 kg/m2  SpO2 97%  HENT:  Head: Normocephalic and atraumatic.  Right Ear: Hearing normal.  Left Ear: Hearing normal.  Eyes: Conjunctivae are normal. No scleral icterus.  Neck: Normal range of motion. Neck  supple. No thyromegaly present.  Cardiovascular: Normal rate, regular rhythm and normal heart sounds.   Pulses:      Radial pulses are 2+ on the right side, and 2+ on the left side.  Pulmonary/Chest: Effort normal and breath sounds normal.  Lymphadenopathy:       Head (right side): No tonsillar, no preauricular, no posterior auricular and no occipital adenopathy present.       Head (left side): No tonsillar, no preauricular, no posterior auricular and no occipital adenopathy present.    He has no cervical adenopathy.       Right: No supraclavicular adenopathy present.       Left: No supraclavicular adenopathy present.  Neurological: He is alert and oriented to person, place, and time. No sensory deficit.  Skin: Skin is warm, dry and intact. No rash noted. No cyanosis or erythema. Nails show no clubbing.  Psychiatric: He has a normal mood and affect.   See DM foot exam. Reduced sensation of the LEFT foot. Considerably hypertrophic nails are long and rough.  Results for orders placed or performed in visit on 02/09/14  POCT CBC  Result Value Ref Range   WBC 5.2 4.6 - 10.2 K/uL   Lymph, poc 2.4 0.6 - 3.4   POC LYMPH PERCENT 46.7 10 - 50 %L   MID (cbc) 0.4 0 - 0.9   POC MID % 7.0 0 - 12 %M   POC Granulocyte 2.4 2 - 6.9   Granulocyte percent 46.3 37 - 80 %G   RBC 4.99 4.69 - 6.13 M/uL   Hemoglobin 13.9 (A) 14.1 - 18.1 g/dL   HCT, POC 44.0 43.5 - 53.7 %   MCV 88.0 80 - 97 fL   MCH, POC 27.8 27 - 31.2 pg   MCHC 31.6 (A) 31.8 - 35.4 g/dL   RDW, POC 13.5 %   Platelet Count, POC 251 142 - 424 K/uL   MPV 6.8 0 - 99.8 fL  POCT glucose (manual entry)  Result Value Ref Range   POC Glucose 115 (A) 70 - 99 mg/dl  POCT glycosylated hemoglobin (Hb A1C)  Result Value Ref Range   Hemoglobin A1C 6.9           Assessment & Plan:  1. Essential hypertension, benign Controlled. Restart diuretic. Continue benazepril and amlodipine. - POCT CBC - hydrochlorothiazide (HYDRODIURIL) 25 MG  tablet; TAKE 1 TABLET BY MOUTH EVERY MORNING  Dispense: 90 tablet; Refill: 3 - benazepril (LOTENSIN) 40 MG tablet; TAKE 1 TABLET BY MOUTH ONCE DAILY.  Dispense: 90 tablet; Refill: 3 - amLODipine (NORVASC) 10 MG tablet; TAKE 1 TABLET BY MOUTH ONCE DAILY.  Dispense: 90 tablet; Refill: 3  2. Hyperlipidemia LDL goal <70 Continue current medications. Await lipids. - Lipid panel - pravastatin (PRAVACHOL) 40 MG tablet; TAKE 1 TABLET BY MOUTH EVERY EVENING .  Dispense: 90 tablet; Refill: 3 - fenofibrate 160 MG tablet; TAKE 1 TABLET BY MOUTH ONCE DAILY.  Dispense: 90 tablet; Refill: 3  3. Type  2 diabetes mellitus with diabetic chronic kidney disease Slight increase in A1C, but remains less than 7%. Re-emphasized the need for healthy eating and regular exercise, at least 150 minutes each week. He likes bike riding. - POCT glucose (manual entry) - POCT glycosylated hemoglobin (Hb A1C) - Comprehensive metabolic panel - Microalbumin, urine - metFORMIN (GLUCOPHAGE) 1000 MG tablet; Take 1 tablet (1,000 mg total) by mouth 2 (two) times daily with a meal.  Dispense: 180 tablet; Refill: 3  4. Tobacco use Encouraged continued efforts for smoking cessation. He's had success thus far with nicotine patches.  5. Monoclonal gammopathy Future labs are ordered for his next visit at the cancer center.  6. Erectile dysfunction due to arterial insufficiency Stable.  7. Chronic LBP Stable. Continue. - meloxicam (MOBIC) 15 MG tablet; TAKE 1 TABLET BY MOUTH ONCE DAILY AS NEEDED FOR PAIN. DO  NOT USE WITH ALEVE  Dispense: 90 tablet; Refill: 3 - HYDROcodone-acetaminophen (NORCO) 7.5-325 MG per tablet; Take 1 tablet by mouth every 6 (six) hours as needed.  Dispense: 30 tablet; Refill: 0  8. Diabetic peripheral neuropathy See above.  9. Nail hypertrophy He's not able to perform nail care himself, and is nervous to do it anyway. Referral to podiatry for debridement. - Ambulatory referral to Podiatry   Fara Chute, PA-C Physician Assistant-Certified [facility]

## 2014-02-10 ENCOUNTER — Encounter: Payer: Self-pay | Admitting: Physician Assistant

## 2014-02-10 NOTE — Progress Notes (Signed)
Appointment made for a 3 month reck with Harrison Mons on 05/18/14 @ 8 am.

## 2014-02-24 ENCOUNTER — Ambulatory Visit (INDEPENDENT_AMBULATORY_CARE_PROVIDER_SITE_OTHER): Payer: Medicare Other | Admitting: Podiatry

## 2014-02-24 ENCOUNTER — Encounter: Payer: Self-pay | Admitting: Podiatry

## 2014-02-24 VITALS — BP 146/78 | HR 64 | Resp 12

## 2014-02-24 DIAGNOSIS — E119 Type 2 diabetes mellitus without complications: Secondary | ICD-10-CM

## 2014-02-24 DIAGNOSIS — B351 Tinea unguium: Secondary | ICD-10-CM | POA: Diagnosis not present

## 2014-02-24 NOTE — Progress Notes (Signed)
   Subjective:    Patient ID: Gregg Flores, male    DOB: [birth year], 69 y.o.   MRN: [id number]  HPI  N- THICK, DISCOLORATION L-B/L TOENAILS D-LONG TERM O-SLOWLY C-WORSE A-NONE T-TRIM  Patient has had no previous podiatric care He denies any history of foot ulcerations or claudication  He is retired  Review of Systems  All other systems reviewed and are negative.      Objective:   Physical Exam  Orientated 3  Vascular: DP pulses 2/4 bilaterally PT pulses 2/4 bilaterally Capillary reflex immediate bilaterally  Neurological: Sensation to 10 g monofilament wire intact 5/5 bilaterally Ankle reflex equal and reactive bilaterally Vibratory sensation intact bilaterally  Dermatological: Texture and turgor within normal limits bilaterally No skin lesions noted bilaterally The toenails are elongated incurvated, discolored and hypertrophic 6-10  Musculoskeletal: There is no restriction ankle, subtalar, midtarsal joints bilaterally Adductovarus position fifth toes bilaterally      Assessment & Plan:   Assessment: Satisfactory neurovascular status Diabetic without vascular or neurological complications Mycotic toenails  Plan: I discussed findings with patient today in regards to his diabetic foot examination and made aware that his neurovascular status was intact. I debrided toenails without a bleeding and recommended that he return at approximately three-month intervals for nail debridement Information about diabetic foot care provided at discharge  Reappoint 3 months

## 2014-02-24 NOTE — Patient Instructions (Signed)
Diabetes and Foot Care Diabetes may cause you to have problems because of poor blood supply (circulation) to your feet and legs. This may cause the skin on your feet to become thinner, break easier, and heal more slowly. Your skin may become dry, and the skin may peel and crack. You may also have nerve damage in your legs and feet causing decreased feeling in them. You may not notice minor injuries to your feet that could lead to infections or more serious problems. Taking care of your feet is one of the most important things you can do for yourself.  HOME CARE INSTRUCTIONS  Wear shoes at all times, even in the house. Do not go barefoot. Bare feet are easily injured.  Check your feet daily for blisters, cuts, and redness. If you cannot see the bottom of your feet, use a mirror or ask someone for help.  Wash your feet with warm water (do not use hot water) and mild soap. Then pat your feet and the areas between your toes until they are completely dry. Do not soak your feet as this can dry your skin.  Apply a moisturizing lotion or petroleum jelly (that does not contain alcohol and is unscented) to the skin on your feet and to dry, brittle toenails. Do not apply lotion between your toes.  Trim your toenails straight across. Do not dig under them or around the cuticle. File the edges of your nails with an emery board or nail file.  Do not cut corns or calluses or try to remove them with medicine.  Wear clean socks or stockings every day. Make sure they are not too tight. Do not wear knee-high stockings since they may decrease blood flow to your legs.  Wear shoes that fit properly and have enough cushioning. To break in new shoes, wear them for just a few hours a day. This prevents you from injuring your feet. Always look in your shoes before you put them on to be sure there are no objects inside.  Do not cross your legs. This may decrease the blood flow to your feet.  If you find a minor scrape,  cut, or break in the skin on your feet, keep it and the skin around it clean and dry. These areas may be cleansed with mild soap and water. Do not cleanse the area with peroxide, alcohol, or iodine.  When you remove an adhesive bandage, be sure not to damage the skin around it.  If you have a wound, look at it several times a day to make sure it is healing.  Do not use heating pads or hot water bottles. They may burn your skin. If you have lost feeling in your feet or legs, you may not know it is happening until it is too late.  Make sure your health care provider performs a complete foot exam at least annually or more often if you have foot problems. Report any cuts, sores, or bruises to your health care provider immediately. SEEK MEDICAL CARE IF:   You have an injury that is not healing.  You have cuts or breaks in the skin.  You have an ingrown nail.  You notice redness on your legs or feet.  You feel burning or tingling in your legs or feet.  You have pain or cramps in your legs and feet.  Your legs or feet are numb.  Your feet always feel cold. SEEK IMMEDIATE MEDICAL CARE IF:   There is increasing redness,   swelling, or pain in or around a wound.  There is a red line that goes up your leg.  Pus is coming from a wound.  You develop a fever or as directed by your health care provider.  You notice a bad smell coming from an ulcer or wound. Document Released: 01/13/2000 Document Revised: 09/17/2012 Document Reviewed: 06/24/2012 Exit[employer] Patient Information 2015 [employer]. This information is not intended to replace advice given to you by your health care provider. Make sure you discuss any questions you have with your health care provider.  

## 2014-03-16 ENCOUNTER — Ambulatory Visit: Payer: Medicare Other | Admitting: Physician Assistant

## 2014-03-22 DIAGNOSIS — N401 Enlarged prostate with lower urinary tract symptoms: Secondary | ICD-10-CM | POA: Diagnosis not present

## 2014-03-22 DIAGNOSIS — N5201 Erectile dysfunction due to arterial insufficiency: Secondary | ICD-10-CM | POA: Diagnosis not present

## 2014-03-22 DIAGNOSIS — R351 Nocturia: Secondary | ICD-10-CM | POA: Diagnosis not present

## 2014-03-25 ENCOUNTER — Encounter: Payer: Self-pay | Admitting: Physician Assistant

## 2014-03-25 DIAGNOSIS — N138 Other obstructive and reflux uropathy: Secondary | ICD-10-CM

## 2014-03-25 DIAGNOSIS — N401 Enlarged prostate with lower urinary tract symptoms: Principal | ICD-10-CM

## 2014-03-25 DIAGNOSIS — N5201 Erectile dysfunction due to arterial insufficiency: Secondary | ICD-10-CM

## 2014-05-18 ENCOUNTER — Encounter: Payer: Self-pay | Admitting: Physician Assistant

## 2014-05-18 ENCOUNTER — Ambulatory Visit (INDEPENDENT_AMBULATORY_CARE_PROVIDER_SITE_OTHER): Payer: Medicare Other | Admitting: Physician Assistant

## 2014-05-18 VITALS — BP 108/64 | HR 55 | Temp 98.3°F | Resp 16 | Ht 69.5 in | Wt 236.0 lb

## 2014-05-18 DIAGNOSIS — R809 Proteinuria, unspecified: Secondary | ICD-10-CM | POA: Diagnosis not present

## 2014-05-18 DIAGNOSIS — J302 Other seasonal allergic rhinitis: Secondary | ICD-10-CM | POA: Diagnosis not present

## 2014-05-18 DIAGNOSIS — N189 Chronic kidney disease, unspecified: Secondary | ICD-10-CM

## 2014-05-18 DIAGNOSIS — E785 Hyperlipidemia, unspecified: Secondary | ICD-10-CM

## 2014-05-18 DIAGNOSIS — E1122 Type 2 diabetes mellitus with diabetic chronic kidney disease: Secondary | ICD-10-CM | POA: Diagnosis not present

## 2014-05-18 DIAGNOSIS — Z6834 Body mass index (BMI) 34.0-34.9, adult: Secondary | ICD-10-CM

## 2014-05-18 DIAGNOSIS — Z720 Tobacco use: Secondary | ICD-10-CM | POA: Diagnosis not present

## 2014-05-18 DIAGNOSIS — M545 Low back pain, unspecified: Secondary | ICD-10-CM

## 2014-05-18 DIAGNOSIS — B182 Chronic viral hepatitis C: Secondary | ICD-10-CM

## 2014-05-18 DIAGNOSIS — G8929 Other chronic pain: Secondary | ICD-10-CM

## 2014-05-18 DIAGNOSIS — I10 Essential (primary) hypertension: Secondary | ICD-10-CM

## 2014-05-18 DIAGNOSIS — Z6835 Body mass index (BMI) 35.0-35.9, adult: Secondary | ICD-10-CM | POA: Insufficient documentation

## 2014-05-18 LAB — LIPID PANEL
CHOL/HDL RATIO: 5.6 ratio
Cholesterol: 123 mg/dL (ref 0–200)
HDL: 22 mg/dL — ABNORMAL LOW (ref >=40)
LDL Cholesterol: 82 mg/dL (ref 0–99)
Triglycerides: 97 mg/dL (ref <150)
VLDL: 19 mg/dL (ref 0–40)

## 2014-05-18 LAB — CBC WITH DIFFERENTIAL/PLATELET
Basophils Absolute: 0 10*3/uL (ref 0.0–0.1)
Basophils Relative: 1 % (ref 0–1)
EOS ABS: 0 10*3/uL (ref 0.0–0.7)
Eosinophils Relative: 1 % (ref 0–5)
HCT: 40.4 % (ref 39.0–52.0)
HEMOGLOBIN: 13.7 g/dL (ref 13.0–17.0)
LYMPHS ABS: 2 10*3/uL (ref 0.7–4.0)
Lymphocytes Relative: 43 % (ref 12–46)
MCH: 28.1 pg (ref 26.0–34.0)
MCHC: 33.9 g/dL (ref 30.0–36.0)
MCV: 83 fL (ref 78.0–100.0)
MPV: 9.1 fL (ref 8.6–12.4)
Monocytes Absolute: 0.4 10*3/uL (ref 0.1–1.0)
Monocytes Relative: 8 % (ref 3–12)
NEUTROS PCT: 47 % (ref 43–77)
Neutro Abs: 2.2 10*3/uL (ref 1.7–7.7)
PLATELETS: 269 10*3/uL (ref 150–400)
RBC: 4.87 MIL/uL (ref 4.22–5.81)
RDW: 14.2 % (ref 11.5–15.5)
WBC: 4.7 10*3/uL (ref 4.0–10.5)

## 2014-05-18 LAB — COMPREHENSIVE METABOLIC PANEL
ALBUMIN: 4.2 g/dL (ref 3.5–5.2)
ALK PHOS: 33 U/L — AB (ref 39–117)
ALT: 19 U/L (ref 0–53)
AST: 23 U/L (ref 0–37)
BUN: 15 mg/dL (ref 6–23)
CALCIUM: 9.1 mg/dL (ref 8.4–10.5)
CHLORIDE: 107 meq/L (ref 96–112)
CO2: 24 meq/L (ref 19–32)
Creat: 1 mg/dL (ref 0.50–1.35)
Glucose, Bld: 105 mg/dL — ABNORMAL HIGH (ref 70–99)
POTASSIUM: 4.7 meq/L (ref 3.5–5.3)
Sodium: 138 mEq/L (ref 135–145)
Total Bilirubin: 0.3 mg/dL (ref 0.2–1.2)
Total Protein: 7.2 g/dL (ref 6.0–8.3)

## 2014-05-18 LAB — MICROALBUMIN, URINE: Microalb, Ur: 5.2 mg/dL — ABNORMAL HIGH (ref <2.0)

## 2014-05-18 LAB — GLUCOSE, POCT (MANUAL RESULT ENTRY): POC GLUCOSE: 119 mg/dL — AB (ref 70–99)

## 2014-05-18 LAB — POCT GLYCOSYLATED HEMOGLOBIN (HGB A1C): HEMOGLOBIN A1C: 6.8

## 2014-05-18 MED ORDER — IPRATROPIUM BROMIDE 0.03 % NA SOLN: 2.0000 | Freq: Two times a day (BID) | NASAL | Status: DC

## 2014-05-18 MED ORDER — HYDROCODONE-ACETAMINOPHEN 7.5-325 MG PO TABS: 1.0000 | ORAL_TABLET | Freq: Four times a day (QID) | ORAL | Status: DC | PRN

## 2014-05-18 NOTE — Progress Notes (Signed)
Subjective:    Patient ID: Gregg Flores, male    DOB: July 13, 1945, 69 y.o.   MRN: [id number]  HPI  Gregg Flores is a 69 year old African American male who presents today for evaluation of diabetes, hypertension, and hyperlipidemia.   He is doing well today and has no major complaints. He has not been regularly checking his blood sugars at home, but when he does check his sugars range from 160-170's. Denies any increased urinary frequency or urgency. He does see a podiatrist every 3 months to trim his toenails and check his feet. He is scheduled to see them 05/26/14.   He has been trying to cut back on the number of cigarettes he smokes per day. He is now smoking 6, which is reduced from 10 cigarettes per day. The cigarette he has in the morning after he gets out of bed will be the hardest for him to cut back on.  He notes occasional seasonal allergies with congestion today. Denies rhinorrhea, sinus pressure, cough, or sore throat. He uses the Atrovent nasal spray every morning which seems to keep his symptoms well-controlled.   Review of Systems  Constitutional: Negative for fever, chills, diaphoresis, appetite change, fatigue and unexpected weight change.  HENT: Positive for congestion. Negative for ear pain, nosebleeds, postnasal drip, rhinorrhea, sinus pressure, sneezing and sore throat.   Eyes: Negative for pain, redness and itching.  Respiratory: Negative for cough, chest tightness and shortness of breath.   Cardiovascular: Negative for chest pain and palpitations.  Gastrointestinal: Negative for nausea, vomiting, abdominal pain, diarrhea and constipation.  Genitourinary: Negative for dysuria, frequency and penile pain.  Musculoskeletal: Negative for myalgias and arthralgias.  Skin: Negative for rash.  Neurological: Negative for dizziness, light-headedness and headaches.       Objective:   Physical Exam  Constitutional: He is oriented to person, place, and time. He appears  well-developed and well-nourished. No distress.  BP 108/64 mmHg  Pulse 55  Temp(Src) 98.3 F (36.8 C) (Oral)  Resp 16  Ht 5' 9.5" (1.765 m)  Wt 236 lb (107.049 kg)  BMI 34.36 kg/m2  SpO2 98%  HENT:  Head: Normocephalic and atraumatic.  Right Ear: External ear normal.  Left Ear: External ear normal.  Nose: Nose normal.  Mouth/Throat: Oropharynx is clear and moist.  Eyes: Conjunctivae are normal. Pupils are equal, round, and reactive to light. No scleral icterus.  Neck: Neck supple.  Cardiovascular: Normal rate, regular rhythm and intact distal pulses.  Exam reveals no gallop and no friction rub.   No murmur heard. Pulses:      Dorsalis pedis pulses are 1+ on the right side, and 1+ on the left side.       Posterior tibial pulses are 1+ on the right side, and 1+ on the left side.  Pulmonary/Chest: Effort normal and breath sounds normal. He has no wheezes. He has no rhonchi. He has no rales. He exhibits no tenderness.  Abdominal: Soft. Bowel sounds are normal. He exhibits no mass. There is no tenderness.  Musculoskeletal: He exhibits no edema.  Lymphadenopathy:    He has no cervical adenopathy.  Neurological: He is alert and oriented to person, place, and time.  Skin: Skin is warm and dry. No rash noted. He is not diaphoretic. No erythema.  Psychiatric: He has a normal mood and affect.      Assessment & Plan:  1. Type 2 diabetes mellitus with diabetic chronic kidney disease - [facility] Diabetes Foot Exam - POCT  glucose (manual entry) - POCT glycosylated hemoglobin (Hb A1C) - Comprehensive metabolic panel - [facility] Diabetes Eye Exam  2. Microalbuminuria - Microalbumin, urine  3. Essential hypertension, benign - CBC with Differential/Platelet  4. Hyperlipidemia LDL goal <70 - Lipid panel  5. BMI 34.0-34.9,adult Education provided about healthy lifestyle choices.  6. Tobacco use Discussed the importance of tobacco cessation. He has cut back from 10 cigarettes to 6 cigarettes per  day. Discussed using a plastic straw as a substitute to put in his mouth instead of a cigarette. Plan is for him to be down to 5 cigarettes by the next visit in 3 months.   7. Other seasonal allergic rhinitis - ipratropium (ATROVENT) 0.03 % nasal spray; Place 2 sprays into the nose every 12 (twelve) hours.  Dispense: 90 mL; Refill: 3  8. Chronic LBP - HYDROcodone-acetaminophen (NORCO) 7.5-325 MG per tablet; Take 1 tablet by mouth every 6 (six) hours as needed.  Dispense: 30 tablet; Refill: 0  9. Chronic hepatitis C without hepatic coma He was supposed to follow up in April 2015 to have a final hepatitis C level drawn to ensure that he was "cured" after being treated. He did not have that lab drawn, so it was ordered today.  - Hepatitis C RNA quantitative

## 2014-05-18 NOTE — Patient Instructions (Signed)
Keep up the great work!  Cut back on smoking-try to eliminate 1 cigarette by the next time you see me. Try cutting a plastic straw into 2-3 pieces and using it when you would smoke.

## 2014-05-18 NOTE — Progress Notes (Signed)
Patient ID: Gregg Flores, male    DOB: 09-06-1945, 69 y.o.   MRN: [id number]  PCP: Porshia Blizzard, PA-C  Subjective:   Chief Complaint  Patient presents with  . Diabetes    3 month check up  . Hypertension  . Hyperlipidemia    HPI Presents for evaluation of diabetes, hypertension, and hyperlipidemia.   He is doing well today and has no major complaints. He has not been regularly checking his blood sugars at home, but when he does check his sugars range from 160-170's. Denies any increased urinary frequency or urgency. He does see a podiatrist every 3 months to trim his toenails and check his feet. He is scheduled to see them 05/26/14.   He has been trying to cut back on the number of cigarettes he smokes per day. He is now smoking 6, which is reduced from 10 cigarettes per day. The cigarette he has in the morning after he gets out of bed will be the hardest for him to cut back on.  He notes occasional seasonal allergies with congestion today. Denies rhinorrhea, sinus pressure, cough, or sore throat. He uses the Atrovent nasal spray every morning which seems to keep his symptoms well-controlled.   In reviewing his records, I see no final HCV RNA Quantitative test, planned for spring of 2015. If that was negative, he was to be considered "cured." He cannot recall having another test, but believes he's been told he no longer has HCV, after completing 12 weeks of Harvoni.    Review of Systems Constitutional: Negative for fever, chills, diaphoresis, appetite change, fatigue and unexpected weight change.  HENT: Positive for congestion. Negative for ear pain, nosebleeds, postnasal drip, rhinorrhea, sinus pressure, sneezing and sore throat.  Eyes: Negative for pain, redness and itching.  Respiratory: Negative for cough, chest tightness and shortness of breath.  Cardiovascular: Negative for chest pain and palpitations.  Gastrointestinal: Negative for nausea, vomiting, abdominal pain,  diarrhea and constipation.  Genitourinary: Negative for dysuria, frequency and penile pain.  Musculoskeletal: Negative for myalgias and arthralgias.  Skin: Negative for rash.  Neurological: Negative for dizziness, light-headedness and headaches. .     Patient Active Problem List   Diagnosis Date Noted  . BMI 34.0-34.9,adult 05/18/2014  . Hepatitis C 06/15/2012  . Hyperlipidemia LDL goal <70 04/06/2012  . Tobacco use 09/25/2011  . Essential hypertension, benign   . Chronic LBP   . Type 2 diabetes mellitus with renal manifestations   . Diverticulosis   . Benign prostatic hyperplasia with urinary obstruction   . Monoclonal gammopathy   . Cataracts, bilateral   . Erectile dysfunction due to arterial insufficiency   . Microalbuminuria   . Benign neoplasm of colon 07/01/2008     Prior to Admission medications   Medication Sig Start Date End Date Taking? Authorizing Provider  amLODipine (NORVASC) 10 MG tablet TAKE 1 TABLET BY MOUTH ONCE DAILY. 02/09/14  Yes Latroya Ng S Angelito Hopping, PA-C  benazepril (LOTENSIN) 40 MG tablet TAKE 1 TABLET BY MOUTH ONCE DAILY. 02/09/14  Yes Yue Glasheen S Khalen Styer, PA-C  fenofibrate 160 MG tablet TAKE 1 TABLET BY MOUTH ONCE DAILY. 02/09/14  Yes Roxana Lai S Melitta Tigue, PA-C  hydrochlorothiazide (HYDRODIURIL) 25 MG tablet TAKE 1 TABLET BY MOUTH EVERY MORNING 02/09/14  Yes Joell Usman S Tay Whitwell, PA-C  HYDROcodone-acetaminophen (NORCO) 7.5-325 MG per tablet Take 1 tablet by mouth every 6 (six) hours as needed. 05/18/14  Yes Nikie Cid S Jamion Carter, PA-C  meloxicam (MOBIC) 15 MG tablet TAKE 1 TABLET BY MOUTH  ONCE DAILY AS NEEDED FOR PAIN. DO  NOT USE WITH ALEVE 02/09/14  Yes Finn Amos S Zahir Eisenhour, PA-C  metFORMIN (GLUCOPHAGE) 1000 MG tablet Take 1 tablet (1,000 mg total) by mouth 2 (two) times daily with a meal. 02/09/14  Yes Teddie Mehta S Jessey Huyett, PA-C  pravastatin (PRAVACHOL) 40 MG tablet TAKE 1 TABLET BY MOUTH EVERY EVENING . 02/09/14  Yes Boden Stucky S Patton Swisher, PA-C  RA ASPIRIN ADULT LOW STRENGTH 81 MG EC  tablet take 1 tablet by mouth once daily 09/30/12  Yes Retta Pitcher S Ramaya Guile, PA-C  ipratropium (ATROVENT) 0.03 % nasal spray Place 2 sprays into the nose every 12 (twelve) hours. 05/18/14   Teona Vargus Janalee Dane, PA-C  nicotine ([employer] NICOTINE TRANSDERMAL SYS) 14 mg/24hr patch Place 1 patch (14 mg total) onto the skin daily. Patient not taking: Reported on 05/18/2014 11/06/13   Bernadene Bell, MD     No Known Allergies     Objective:  Physical Exam  Constitutional: He is oriented to person, place, and time. Vital signs are normal. He appears well-developed and well-nourished. He is active and cooperative. No distress.  BP 108/64 mmHg  Pulse 55  Temp(Src) 98.3 F (36.8 C) (Oral)  Resp 16  Ht 5' 9.5" (1.765 m)  Wt 236 lb (107.049 kg)  BMI 34.36 kg/m2  SpO2 98%   HENT:  Head: Normocephalic and atraumatic.  Right Ear: Hearing normal.  Left Ear: Hearing normal.  Eyes: Conjunctivae are normal. No scleral icterus.  Neck: Normal range of motion. Neck supple. No thyromegaly present.  Cardiovascular: Normal rate, regular rhythm and normal heart sounds.   Pulses:      Radial pulses are 2+ on the right side, and 2+ on the left side.  Pulmonary/Chest: Effort normal and breath sounds normal.  Lymphadenopathy:       Head (right side): No tonsillar, no preauricular, no posterior auricular and no occipital adenopathy present.       Head (left side): No tonsillar, no preauricular, no posterior auricular and no occipital adenopathy present.    He has no cervical adenopathy.       Right: No supraclavicular adenopathy present.       Left: No supraclavicular adenopathy present.  Neurological: He is alert and oriented to person, place, and time. No sensory deficit.  Skin: Skin is warm, dry and intact. No rash noted. No cyanosis or erythema. Nails show no clubbing.  Psychiatric: He has a normal mood and affect.   Results for orders placed or performed in visit on 05/18/14  POCT glucose (manual entry)  Result  Value Ref Range   POC Glucose 119 (A) 70 - 99 mg/dl  POCT glycosylated hemoglobin (Hb A1C)  Result Value Ref Range   Hemoglobin A1C 6.8            Assessment & Plan:   1. Type 2 diabetes mellitus with diabetic chronic kidney disease Controlled. Keep working on healthy eating and regular exercise. Continue current medications.  - [facility] Diabetes Foot Exam - POCT glucose (manual entry) - POCT glycosylated hemoglobin (Hb A1C) - Comprehensive metabolic panel - [facility] Diabetes Eye Exam  2. Microalbuminuria Await results of lab. Adjust medications if needed. - Microalbumin, urine  3. Essential hypertension, benign Controlled. - CBC with Differential/Platelet  4. Hyperlipidemia LDL goal <70 Await labs. Healthy lifestyle. Adjust medications if indicated. - Lipid panel  5. BMI 34.0-34.9,adult See above.   6. Tobacco use Counseled on quitting smoking. He's down to 6 cigarettes/day. Next goal is to cut back to  5. Discussed alternatives (chewing on a straw, etc) to smoking.  7. Other seasonal allergic rhinitis Resume atrovent. - ipratropium (ATROVENT) 0.03 % nasal spray; Place 2 sprays into the nose every 12 (twelve) hours.  Dispense: 90 mL; Refill: 3  8. Chronic LBP Stable on current treatment. Continue. - HYDROcodone-acetaminophen (NORCO) 7.5-325 MG per tablet; Take 1 tablet by mouth every 6 (six) hours as needed.  Dispense: 30 tablet; Refill: 0  9. Chronic hepatitis C without hepatic coma Contact [facility]. He has not had the final "test for cure." Will do that today. - Hepatitis C RNA quantitative   Fara Chute, PA-C Physician Assistant-Certified [facility]

## 2014-05-20 LAB — HEPATITIS C RNA QUANTITATIVE: HCV QUANT: NOT DETECTED [IU]/mL (ref <15)

## 2014-05-25 ENCOUNTER — Encounter: Payer: Self-pay | Admitting: Physician Assistant

## 2014-05-26 ENCOUNTER — Encounter: Payer: Self-pay | Admitting: Podiatry

## 2014-05-26 ENCOUNTER — Ambulatory Visit (INDEPENDENT_AMBULATORY_CARE_PROVIDER_SITE_OTHER): Payer: Medicare Other | Admitting: Podiatry

## 2014-05-26 DIAGNOSIS — B351 Tinea unguium: Secondary | ICD-10-CM

## 2014-05-26 DIAGNOSIS — E119 Type 2 diabetes mellitus without complications: Secondary | ICD-10-CM

## 2014-05-26 NOTE — Patient Instructions (Signed)
Diabetes and Foot Care Diabetes may cause you to have problems because of poor blood supply (circulation) to your feet and legs. This may cause the skin on your feet to become thinner, break easier, and heal more slowly. Your skin may become dry, and the skin may peel and crack. You may also have nerve damage in your legs and feet causing decreased feeling in them. You may not notice minor injuries to your feet that could lead to infections or more serious problems. Taking care of your feet is one of the most important things you can do for yourself.  HOME CARE INSTRUCTIONS  Wear shoes at all times, even in the house. Do not go barefoot. Bare feet are easily injured.  Check your feet daily for blisters, cuts, and redness. If you cannot see the bottom of your feet, use a mirror or ask someone for help.  Wash your feet with warm water (do not use hot water) and mild soap. Then pat your feet and the areas between your toes until they are completely dry. Do not soak your feet as this can dry your skin.  Apply a moisturizing lotion or petroleum jelly (that does not contain alcohol and is unscented) to the skin on your feet and to dry, brittle toenails. Do not apply lotion between your toes.  Trim your toenails straight across. Do not dig under them or around the cuticle. File the edges of your nails with an emery board or nail file.  Do not cut corns or calluses or try to remove them with medicine.  Wear clean socks or stockings every day. Make sure they are not too tight. Do not wear knee-high stockings since they may decrease blood flow to your legs.  Wear shoes that fit properly and have enough cushioning. To break in new shoes, wear them for just a few hours a day. This prevents you from injuring your feet. Always look in your shoes before you put them on to be sure there are no objects inside.  Do not cross your legs. This may decrease the blood flow to your feet.  If you find a minor scrape,  cut, or break in the skin on your feet, keep it and the skin around it clean and dry. These areas may be cleansed with mild soap and water. Do not cleanse the area with peroxide, alcohol, or iodine.  When you remove an adhesive bandage, be sure not to damage the skin around it.  If you have a wound, look at it several times a day to make sure it is healing.  Do not use heating pads or hot water bottles. They may burn your skin. If you have lost feeling in your feet or legs, you may not know it is happening until it is too late.  Make sure your health care provider performs a complete foot exam at least annually or more often if you have foot problems. Report any cuts, sores, or bruises to your health care provider immediately. SEEK MEDICAL CARE IF:   You have an injury that is not healing.  You have cuts or breaks in the skin.  You have an ingrown nail.  You notice redness on your legs or feet.  You feel burning or tingling in your legs or feet.  You have pain or cramps in your legs and feet.  Your legs or feet are numb.  Your feet always feel cold. SEEK IMMEDIATE MEDICAL CARE IF:   There is increasing redness,   swelling, or pain in or around a wound.  There is a red line that goes up your leg.  Pus is coming from a wound.  You develop a fever or as directed by your health care provider.  You notice a bad smell coming from an ulcer or wound. Document Released: 01/13/2000 Document Revised: 09/17/2012 Document Reviewed: 06/24/2012 Exit[employer] Patient Information 2015 [employer]. This information is not intended to replace advice given to you by your health care provider. Make sure you discuss any questions you have with your health care provider.  

## 2014-05-26 NOTE — Progress Notes (Signed)
Patient ID: Gregg Flores, male   DOB: Dec 28, 1945, 69 y.o.   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

## 2014-08-17 ENCOUNTER — Ambulatory Visit (INDEPENDENT_AMBULATORY_CARE_PROVIDER_SITE_OTHER): Payer: Medicare Other | Admitting: Physician Assistant

## 2014-08-17 ENCOUNTER — Encounter: Payer: Self-pay | Admitting: Physician Assistant

## 2014-08-17 VITALS — BP 128/83 | HR 70 | Temp 98.2°F | Resp 16 | Ht 69.0 in | Wt 235.0 lb

## 2014-08-17 DIAGNOSIS — N189 Chronic kidney disease, unspecified: Secondary | ICD-10-CM | POA: Diagnosis not present

## 2014-08-17 DIAGNOSIS — E785 Hyperlipidemia, unspecified: Secondary | ICD-10-CM | POA: Diagnosis not present

## 2014-08-17 DIAGNOSIS — R809 Proteinuria, unspecified: Secondary | ICD-10-CM | POA: Diagnosis not present

## 2014-08-17 DIAGNOSIS — I10 Essential (primary) hypertension: Secondary | ICD-10-CM | POA: Diagnosis not present

## 2014-08-17 DIAGNOSIS — E1122 Type 2 diabetes mellitus with diabetic chronic kidney disease: Secondary | ICD-10-CM

## 2014-08-17 LAB — CBC WITH DIFFERENTIAL/PLATELET
Basophils Absolute: 0 10*3/uL (ref 0.0–0.1)
Basophils Relative: 1 % (ref 0–1)
EOS ABS: 0 10*3/uL (ref 0.0–0.7)
Eosinophils Relative: 1 % (ref 0–5)
HCT: 40.8 % (ref 39.0–52.0)
Hemoglobin: 13.7 g/dL (ref 13.0–17.0)
LYMPHS PCT: 41 % (ref 12–46)
Lymphs Abs: 1.9 10*3/uL (ref 0.7–4.0)
MCH: 27.8 pg (ref 26.0–34.0)
MCHC: 33.6 g/dL (ref 30.0–36.0)
MCV: 82.9 fL (ref 78.0–100.0)
MPV: 10.1 fL (ref 8.6–12.4)
Monocytes Absolute: 0.3 10*3/uL (ref 0.1–1.0)
Monocytes Relative: 7 % (ref 3–12)
NEUTROS ABS: 2.3 10*3/uL (ref 1.7–7.7)
Neutrophils Relative %: 50 % (ref 43–77)
PLATELETS: 237 10*3/uL (ref 150–400)
RBC: 4.92 MIL/uL (ref 4.22–5.81)
RDW: 14.4 % (ref 11.5–15.5)
WBC: 4.6 10*3/uL (ref 4.0–10.5)

## 2014-08-17 LAB — COMPREHENSIVE METABOLIC PANEL
ALBUMIN: 4.1 g/dL (ref 3.5–5.2)
ALT: 17 U/L (ref 0–53)
AST: 19 U/L (ref 0–37)
Alkaline Phosphatase: 31 U/L — ABNORMAL LOW (ref 39–117)
BILIRUBIN TOTAL: 0.3 mg/dL (ref 0.2–1.2)
BUN: 16 mg/dL (ref 6–23)
CHLORIDE: 107 meq/L (ref 96–112)
CO2: 21 mEq/L (ref 19–32)
Calcium: 9.5 mg/dL (ref 8.4–10.5)
Creat: 0.96 mg/dL (ref 0.50–1.35)
Glucose, Bld: 112 mg/dL — ABNORMAL HIGH (ref 70–99)
Potassium: 4.3 mEq/L (ref 3.5–5.3)
Sodium: 139 mEq/L (ref 135–145)
TOTAL PROTEIN: 7.4 g/dL (ref 6.0–8.3)

## 2014-08-17 LAB — POCT GLYCOSYLATED HEMOGLOBIN (HGB A1C): Hemoglobin A1C: 6.8

## 2014-08-17 LAB — LIPID PANEL
CHOLESTEROL: 126 mg/dL (ref 0–200)
HDL: 23 mg/dL — ABNORMAL LOW (ref >=40)
LDL CALC: 81 mg/dL (ref 0–99)
TRIGLYCERIDES: 110 mg/dL (ref <150)
Total CHOL/HDL Ratio: 5.5 Ratio
VLDL: 22 mg/dL (ref 0–40)

## 2014-08-17 LAB — GLUCOSE, POCT (MANUAL RESULT ENTRY): POC GLUCOSE: 123 mg/dL — AB (ref 70–99)

## 2014-08-17 NOTE — Progress Notes (Signed)
   Subjective:    Patient ID: Gregg Flores, male    DOB: May 11, 1945, 69 y.o.   MRN: [id number]  HPI Patient with a PMH of HTN, DM2, HL, and chronic albuminuria presents for diabetes and cholesterol follow up. The patient reports that he is compliant with all medications. He states that he checks his BG every few days, and reports a BG of 180 mg/dL last time he checked, a couple weeks ago. Pt reports that he eats what he wants, in smaller portions now, and eat a lot of fresh vegetables from his garden. Today pateint denies fatigue, HA, nausea, polyuria, polydipsia, nocturia. He states that he has no complaints or concerns at this time.  Review of Systems  Constitutional: Negative for fever, activity change, appetite change, fatigue and unexpected weight change.  Eyes: Negative for photophobia and visual disturbance.  Respiratory: Negative for cough, chest tightness and shortness of breath.   Cardiovascular: Negative for chest pain, palpitations and leg swelling.  Gastrointestinal: Negative for abdominal pain.  Endocrine: Negative for polydipsia and polyuria.  Genitourinary: Negative for frequency and difficulty urinating.  Skin: Negative for color change, pallor, rash and wound.  Neurological: Negative for dizziness and headaches.  Psychiatric/Behavioral: Negative for sleep disturbance.   Results for orders placed or performed in visit on 08/17/14  POCT glucose (manual entry)  Result Value Ref Range   POC Glucose 123 (A) 70 - 99 mg/dl  POCT glycosylated hemoglobin (Hb A1C)  Result Value Ref Range   Hemoglobin A1C 6.8       Objective:   Physical Exam  Constitutional: He is oriented to person, place, and time. He appears well-developed and well-nourished. No distress.  BP 128/83 mmHg  Pulse 70  Temp(Src) 98.2 F (36.8 C)  Resp 16  Ht 5\' 9"  (1.753 m)  Wt 235 lb (106.595 kg)  BMI 34.69 kg/m2   HENT:  Head: Normocephalic and atraumatic.  Eyes: Right eye exhibits no discharge.  Left eye exhibits no discharge. No scleral icterus.  No retinal hemorrhages or abnormalities noted on fundoscopic exam.  Cardiovascular: Normal rate, regular rhythm and normal heart sounds.  Exam reveals no gallop and no friction rub.   No murmur heard. Pulmonary/Chest: Effort normal and breath sounds normal. No respiratory distress. He has no wheezes. He has no rales.  Neurological: He is alert and oriented to person, place, and time.  Skin: Skin is warm and dry. No rash noted. He is not diaphoretic. No erythema. No pallor.  Psychiatric: He has a normal mood and affect. His behavior is normal. Judgment and thought content normal.          Assessment & Plan:  1. Type 2 diabetes mellitus with diabetic chronic kidney disease - Comprehensive metabolic panel - POCT glucose (manual entry) - POCT glycosylated hemoglobin (Hb A1C) - controlled, continue current treatment. 2. Microalbuminuria - Microalbumin, urine 3. Essential hypertension, benign - CBC with Differential/Platelet - controlled, continue current treatment. 4. Hyperlipidemia LDL goal <70 - Lipid panel - FDA recently issued a change in their recommendations for lipid-lowering therapy,  stating that the risks of cotherapy with a statin and fenofibrate now outweigh the benefits. Today we d/c-ed the pts fenofibrate. We will follow the patients lipids and if lipid levels are elevated at his 3 month FU, we will consider increasing his dose of Pravastatin from 40 mg to 80 mg PO daily.

## 2014-08-17 NOTE — Progress Notes (Signed)
Patient ID: Gregg Flores, male    DOB: Apr 04, 1945, 69 y.o.   MRN: [id number]  PCP: Gabryel Files, PA-C  Subjective:   Chief Complaint  Patient presents with  . Follow-up  . Diabetes    HPI Presents for evaluation of HTN, DM2, HL, and chronic albuminuria presents for diabetes and cholesterol follow up. The patient reports that he is compliant with all medications. He states that he checks his BG every few days, and reports a BG of 180 mg/dL last time he checked, a couple weeks ago. Pt reports that he eats what he wants, in smaller portions now, and eat a lot of fresh vegetables from his garden. Today pateint denies fatigue, HA, nausea, polyuria, polydipsia, nocturia. He states that he has no complaints or concerns at this time.  He brought a letter from his pharmacy, suggesting D/C of either fenofibrate or statin drug, as the FDA has recently issued a statement that the increased risk of adverse effects does not outweigh the benefit of the additional lipid lowering afforded.  Review of Systems Constitutional: Negative for fever, activity change, appetite change, fatigue and unexpected weight change.  Eyes: Negative for photophobia and visual disturbance.  Respiratory: Negative for cough, chest tightness and shortness of breath.  Cardiovascular: Negative for chest pain, palpitations and leg swelling.  Gastrointestinal: Negative for abdominal pain.  Endocrine: Negative for polydipsia and polyuria.  Genitourinary: Negative for frequency and difficulty urinating.  Skin: Negative for color change, pallor, rash and wound.  Neurological: Negative for dizziness and headaches.  Psychiatric/Behavioral: Negative for sleep disturbance.      Patient Active Problem List   Diagnosis Date Noted  . BMI 34.0-34.9,adult 05/18/2014  . History of hepatitis C 06/15/2012  . Hyperlipidemia LDL goal <70 04/06/2012  . Tobacco use 09/25/2011  . Essential hypertension, benign   . Chronic LBP   .  Type 2 diabetes mellitus with renal manifestations   . Diverticulosis   . Benign prostatic hyperplasia with urinary obstruction   . Monoclonal gammopathy   . Cataracts, bilateral   . Erectile dysfunction due to arterial insufficiency   . Microalbuminuria   . Benign neoplasm of colon 07/01/2008     Prior to Admission medications   Medication Sig Start Date End Date Taking? Authorizing Provider  amLODipine (NORVASC) 10 MG tablet TAKE 1 TABLET BY MOUTH ONCE DAILY. 02/09/14  Yes Zygmunt Mcglinn, PA-C  benazepril (LOTENSIN) 40 MG tablet TAKE 1 TABLET BY MOUTH ONCE DAILY. 02/09/14  Yes Arriona Prest, PA-C  fenofibrate 160 MG tablet TAKE 1 TABLET BY MOUTH ONCE DAILY. 02/09/14  Yes Neilani Duffee, PA-C  hydrochlorothiazide (HYDRODIURIL) 25 MG tablet TAKE 1 TABLET BY MOUTH EVERY MORNING 02/09/14  Yes Naithen Rivenburg, PA-C  HYDROcodone-acetaminophen (NORCO) 7.5-325 MG per tablet Take 1 tablet by mouth every 6 (six) hours as needed. 05/18/14  Yes Alcide Memoli, PA-C  ipratropium (ATROVENT) 0.03 % nasal spray Place 2 sprays into the nose every 12 (twelve) hours. 05/18/14  Yes Kimyatta Lecy, PA-C  meloxicam (MOBIC) 15 MG tablet TAKE 1 TABLET BY MOUTH ONCE DAILY AS NEEDED FOR PAIN. DO  NOT USE WITH ALEVE 02/09/14  Yes Marjani Kobel, PA-C  metFORMIN (GLUCOPHAGE) 1000 MG tablet Take 1 tablet (1,000 mg total) by mouth 2 (two) times daily with a meal. 02/09/14  Yes Byford Schools, PA-C  pravastatin (PRAVACHOL) 40 MG tablet TAKE 1 TABLET BY MOUTH EVERY EVENING . 02/09/14  Yes Gisela Lea, PA-C  RA ASPIRIN ADULT LOW STRENGTH 81 MG EC tablet take  1 tablet by mouth once daily 09/30/12  Yes Matteus Mcnelly, PA-C     No Known Allergies     Objective:  Physical Exam  Constitutional: He is oriented to person, place, and time. Vital signs are normal. He appears well-developed and well-nourished. He is active and cooperative. No distress.  BP 128/83 mmHg  Pulse 70  Temp(Src) 98.2 F (36.8 C)  Resp 16  Ht 5\' 9"   (1.753 m)  Wt 235 lb (106.595 kg)  BMI 34.69 kg/m2  HENT:  Head: Normocephalic and atraumatic.  Right Ear: Hearing normal.  Left Ear: Hearing normal.  Eyes: Conjunctivae are normal. No scleral icterus.  Neck: Normal range of motion. Neck supple. No thyromegaly present.  Cardiovascular: Normal rate, regular rhythm and normal heart sounds.   Pulses:      Radial pulses are 2+ on the right side, and 2+ on the left side.  Pulmonary/Chest: Effort normal and breath sounds normal.  Lymphadenopathy:       Head (right side): No tonsillar, no preauricular, no posterior auricular and no occipital adenopathy present.       Head (left side): No tonsillar, no preauricular, no posterior auricular and no occipital adenopathy present.    He has no cervical adenopathy.       Right: No supraclavicular adenopathy present.       Left: No supraclavicular adenopathy present.  Neurological: He is alert and oriented to person, place, and time. No sensory deficit.  Skin: Skin is warm, dry and intact. No rash noted. No cyanosis or erythema. Nails show no clubbing.  Psychiatric: He has a normal mood and affect. His speech is normal and behavior is normal.      Results for orders placed or performed in visit on 08/17/14  POCT glucose (manual entry)  Result Value Ref Range   POC Glucose 123 (A) 70 - 99 mg/dl  POCT glycosylated hemoglobin (Hb A1C)  Result Value Ref Range   Hemoglobin A1C 6.8         Assessment & Plan:   1. Type 2 diabetes mellitus with diabetic chronic kidney disease Controlled. Continue working on healthy eating and regular exercise. He'll get a flu vaccine in the fall. - Comprehensive metabolic panel - POCT glucose (manual entry) - POCT glycosylated hemoglobin (Hb A1C)  2. Microalbuminuria - Microalbumin, urine  3. Essential hypertension, benign Controlled. - CBC with Differential/Platelet  4. Hyperlipidemia LDL goal <70 D/C fenofibrate. Recheck lipids in 3 months. If  additional lipid lowering needed, plan increase pravastatin from 40 mg to 80 mg. - Lipid panel   Fara Chute, PA-C Physician Assistant-Certified [facility]

## 2014-08-17 NOTE — Patient Instructions (Signed)
Keep up the great work!

## 2014-08-18 LAB — MICROALBUMIN, URINE: Microalb, Ur: 5.1 mg/dL — ABNORMAL HIGH (ref <2.0)

## 2014-08-23 ENCOUNTER — Encounter: Payer: Self-pay | Admitting: Physician Assistant

## 2014-09-22 ENCOUNTER — Ambulatory Visit (INDEPENDENT_AMBULATORY_CARE_PROVIDER_SITE_OTHER): Payer: Medicare Other | Admitting: Podiatry

## 2014-09-22 ENCOUNTER — Encounter: Payer: Self-pay | Admitting: Podiatry

## 2014-09-22 DIAGNOSIS — M79676 Pain in unspecified toe(s): Secondary | ICD-10-CM

## 2014-09-22 DIAGNOSIS — B351 Tinea unguium: Secondary | ICD-10-CM

## 2014-09-22 NOTE — Progress Notes (Signed)
Patient ID: Gregg Flores, male   DOB: 1945/08/05, 69 y.o.   MRN: [id number] Subjective: This patient presents today complaining of painful thickened toenails and request toenail debridement.  Objective: The toenails are elongated, brittle, discolored, incurvated, hypertrophic and tender to direct palpation 6-10  Assessment: Symptomatic onychomycoses 6-10 Diabetic without complications  Plan: Debridement toenails 10 mechanically and electrically without any bleeding  Reappoint 4 months

## 2014-09-22 NOTE — Patient Instructions (Signed)
Diabetes and Foot Care Diabetes may cause you to have problems because of poor blood supply (circulation) to your feet and legs. This may cause the skin on your feet to become thinner, break easier, and heal more slowly. Your skin may become dry, and the skin may peel and crack. You may also have nerve damage in your legs and feet causing decreased feeling in them. You may not notice minor injuries to your feet that could lead to infections or more serious problems. Taking care of your feet is one of the most important things you can do for yourself.  HOME CARE INSTRUCTIONS  Wear shoes at all times, even in the house. Do not go barefoot. Bare feet are easily injured.  Check your feet daily for blisters, cuts, and redness. If you cannot see the bottom of your feet, use a mirror or ask someone for help.  Wash your feet with warm water (do not use hot water) and mild soap. Then pat your feet and the areas between your toes until they are completely dry. Do not soak your feet as this can dry your skin.  Apply a moisturizing lotion or petroleum jelly (that does not contain alcohol and is unscented) to the skin on your feet and to dry, brittle toenails. Do not apply lotion between your toes.  Trim your toenails straight across. Do not dig under them or around the cuticle. File the edges of your nails with an emery board or nail file.  Do not cut corns or calluses or try to remove them with medicine.  Wear clean socks or stockings every day. Make sure they are not too tight. Do not wear knee-high stockings since they may decrease blood flow to your legs.  Wear shoes that fit properly and have enough cushioning. To break in new shoes, wear them for just a few hours a day. This prevents you from injuring your feet. Always look in your shoes before you put them on to be sure there are no objects inside.  Do not cross your legs. This may decrease the blood flow to your feet.  If you find a minor scrape,  cut, or break in the skin on your feet, keep it and the skin around it clean and dry. These areas may be cleansed with mild soap and water. Do not cleanse the area with peroxide, alcohol, or iodine.  When you remove an adhesive bandage, be sure not to damage the skin around it.  If you have a wound, look at it several times a day to make sure it is healing.  Do not use heating pads or hot water bottles. They may burn your skin. If you have lost feeling in your feet or legs, you may not know it is happening until it is too late.  Make sure your health care provider performs a complete foot exam at least annually or more often if you have foot problems. Report any cuts, sores, or bruises to your health care provider immediately. SEEK MEDICAL CARE IF:   You have an injury that is not healing.  You have cuts or breaks in the skin.  You have an ingrown nail.  You notice redness on your legs or feet.  You feel burning or tingling in your legs or feet.  You have pain or cramps in your legs and feet.  Your legs or feet are numb.  Your feet always feel cold. SEEK IMMEDIATE MEDICAL CARE IF:   There is increasing redness,   swelling, or pain in or around a wound.  There is a red line that goes up your leg.  Pus is coming from a wound.  You develop a fever or as directed by your health care provider.  You notice a bad smell coming from an ulcer or wound. Document Released: 01/13/2000 Document Revised: 09/17/2012 Document Reviewed: 06/24/2012 Exit[employer] Patient Information 2015 [employer]. This information is not intended to replace advice given to you by your health care provider. Make sure you discuss any questions you have with your health care provider.  

## 2014-10-11 ENCOUNTER — Telehealth: Payer: Self-pay | Admitting: Hematology

## 2014-10-11 NOTE — Telephone Encounter (Signed)
Moved 10/14 f/u from [facility] to [place]. Left message for patient and mailed schedule.

## 2014-10-26 ENCOUNTER — Encounter: Payer: Self-pay | Admitting: Family Medicine

## 2014-11-12 ENCOUNTER — Telehealth: Payer: Self-pay | Admitting: Hematology

## 2014-11-12 ENCOUNTER — Ambulatory Visit (HOSPITAL_BASED_OUTPATIENT_CLINIC_OR_DEPARTMENT_OTHER): Payer: Medicare Other | Admitting: Hematology

## 2014-11-12 ENCOUNTER — Other Ambulatory Visit (HOSPITAL_BASED_OUTPATIENT_CLINIC_OR_DEPARTMENT_OTHER): Payer: Medicare Other

## 2014-11-12 ENCOUNTER — Encounter: Payer: Self-pay | Admitting: Hematology

## 2014-11-12 VITALS — BP 139/85 | HR 64 | Temp 98.5°F | Resp 18 | Ht 69.0 in | Wt 244.9 lb

## 2014-11-12 DIAGNOSIS — D472 Monoclonal gammopathy: Secondary | ICD-10-CM | POA: Diagnosis not present

## 2014-11-12 DIAGNOSIS — B1920 Unspecified viral hepatitis C without hepatic coma: Secondary | ICD-10-CM | POA: Diagnosis not present

## 2014-11-12 LAB — COMPREHENSIVE METABOLIC PANEL (CC13)
ALBUMIN: 3.8 g/dL (ref 3.5–5.0)
ALT: 18 U/L (ref 0–55)
ANION GAP: 9 meq/L (ref 3–11)
AST: 17 U/L (ref 5–34)
Alkaline Phosphatase: 60 U/L (ref 40–150)
BILIRUBIN TOTAL: 0.3 mg/dL (ref 0.20–1.20)
BUN: 13.4 mg/dL (ref 7.0–26.0)
CALCIUM: 8.9 mg/dL (ref 8.4–10.4)
CHLORIDE: 111 meq/L — AB (ref 98–109)
CO2: 21 mEq/L — ABNORMAL LOW (ref 22–29)
CREATININE: 0.8 mg/dL (ref 0.7–1.3)
EGFR: >90 mL/min/{1.73_m2} (ref >90)
Glucose: 118 mg/dl (ref 70–140)
Potassium: 4.2 mEq/L (ref 3.5–5.1)
Sodium: 141 mEq/L (ref 136–145)
TOTAL PROTEIN: 7.3 g/dL (ref 6.4–8.3)

## 2014-11-12 LAB — CBC WITH DIFFERENTIAL/PLATELET
BASO%: 1.2 % (ref 0.0–2.0)
Basophils Absolute: 0 10*3/uL (ref 0.0–0.1)
EOS%: 1.6 % (ref 0.0–7.0)
Eosinophils Absolute: 0.1 10*3/uL (ref 0.0–0.5)
HCT: 42 % (ref 38.4–49.9)
HEMOGLOBIN: 13.7 g/dL (ref 13.0–17.1)
LYMPH#: 1.8 10*3/uL (ref 0.9–3.3)
LYMPH%: 43.5 % (ref 14.0–49.0)
MCH: 28.1 pg (ref 27.2–33.4)
MCHC: 32.7 g/dL (ref 32.0–36.0)
MCV: 86 fL (ref 79.3–98.0)
MONO#: 0.4 10*3/uL (ref 0.1–0.9)
MONO%: 9.3 % (ref 0.0–14.0)
NEUT%: 44.4 % (ref 39.0–75.0)
NEUTROS ABS: 1.9 10*3/uL (ref 1.5–6.5)
PLATELETS: 189 10*3/uL (ref 140–400)
RBC: 4.89 10*6/uL (ref 4.20–5.82)
RDW: 13.3 % (ref 11.0–14.6)
WBC: 4.2 10*3/uL (ref 4.0–10.3)

## 2014-11-12 NOTE — Telephone Encounter (Signed)
per pof to sch pt appt-gave pt copy of avs °

## 2014-11-13 NOTE — Progress Notes (Signed)
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 He notes no acute new symptoms. Labs today show no anemia. No fatigue. No constitutional symptoms. No new bone pains. Kidney function is stable. He was last seen in clinic by Dr. Lona Kettle on 11/06/2013. Has tried to cut down in the smoking but still smoking a fair amount. Has cut his alcohol intake.  REVIEW OF SYSTEMS:    10 Point review of systems of done and is negative except as noted above.  . Past Medical History  Diagnosis Date  . Essential hypertension, benign 1990  . Chronic LBP   . Type II or unspecified type diabetes mellitus without mention of complication, not stated as uncontrolled   . Diverticulosis   . Colon polyps   . BPH (benign prostatic hyperplasia)   . Monoclonal gammopathy 04/2008  . Cataracts, bilateral   . ED (erectile  dysfunction)   . Microalbuminuria    hepatitis C  . Past Surgical History  Procedure Laterality Date  . Transurethral resection of prostate      . Social History  Substance Use Topics  . Smoking status: Current Every Day Smoker -- 0.50 packs/day for 43 years    Types: Cigarettes    Last Attempt to Quit: 09/24/2011  . Smokeless tobacco: Never Used     Comment: has cut back to 0.5 ppd, using patches  . Alcohol Use: No     Comment: occasionally, wine    ALLERGIES:  has No Known Allergies.  MEDICATIONS:  Current Outpatient Prescriptions  Medication Sig Dispense Refill  . amLODipine (NORVASC) 10 MG tablet TAKE 1 TABLET BY MOUTH ONCE DAILY. 90 tablet 3  . benazepril (LOTENSIN) 40 MG tablet TAKE 1 TABLET BY MOUTH ONCE DAILY. 90 tablet 3  . hydrochlorothiazide (HYDRODIURIL) 25 MG tablet TAKE 1 TABLET BY MOUTH EVERY MORNING 90 tablet 3  . HYDROcodone-acetaminophen (NORCO) 7.5-325 MG per tablet Take 1 tablet by mouth every 6 (six) hours as needed. 30 tablet 0  . ipratropium (ATROVENT) 0.03 % nasal spray Place 2 sprays into the nose every 12 (twelve) hours. 90 mL 3  . meloxicam (MOBIC) 15 MG tablet TAKE 1 TABLET BY MOUTH ONCE DAILY AS NEEDED FOR PAIN. DO  NOT USE WITH ALEVE 90 tablet 3  . metFORMIN (GLUCOPHAGE) 1000 MG tablet Take 1 tablet (1,000 mg total) by mouth 2 (two) times daily with a meal. 180 tablet 3  . pravastatin (PRAVACHOL) 40 MG tablet TAKE 1 TABLET  BY MOUTH EVERY EVENING . 90 tablet 3  . RA ASPIRIN ADULT LOW STRENGTH 81 MG EC tablet take 1 tablet by mouth once daily 100 tablet 4   No current facility-administered medications for this visit.    PHYSICAL EXAMINATION: ECOG PERFORMANCE STATUS: 1 - Symptomatic but completely ambulatory  Filed Vitals:   11/12/14 0832  BP: 139/85  Pulse: 64  Temp: 98.5 F (36.9 C)  Resp: 18   Filed Weights   11/12/14 0832  Weight: 244 lb 14.4 oz (111.086 kg)   .Body mass index is 36.15 kg/(m^2).  GENERAL:alert, in no acute  distress and comfortable SKIN: skin color, texture, turgor are normal, no rashes or significant lesions EYES: normal, conjunctiva are pink and non-injected, sclera clear OROPHARYNX:no exudate, no erythema and lips, buccal mucosa, and tongue normal  NECK: supple, no JVD, thyroid normal size, non-tender, without nodularity LYMPH:  no palpable lymphadenopathy in the cervical, axillary or inguinal LUNGS: clear to auscultation with normal respiratory effort HEART: regular rate & rhythm,  no murmurs and no lower extremity edema ABDOMEN: abdomen soft, non-tender, normoactive bowel sounds  Musculoskeletal: no cyanosis of digits and no clubbing  PSYCH: alert & oriented x 3 with fluent speech NEURO: no focal motor/sensory deficits  LABORATORY DATA:   I have reviewed the data as listed  . CBC Latest Ref Rng 11/12/2014 08/17/2014 05/18/2014  WBC 4.0 - 10.3 10e3/uL 4.2 4.6 4.7  Hemoglobin 13.0 - 17.1 g/dL 13.7 13.7 13.7  Hematocrit 38.4 - 49.9 % 42.0 40.8 40.4  Platelets 140 - 400 10e3/uL 189 237 269    . CMP Latest Ref Rng 11/12/2014 08/17/2014 05/18/2014  Glucose 70 - 140 mg/dl 118 112(H) 105(H)  BUN 7.0 - 26.0 mg/dL 13.4 16 15   Creatinine 0.7 - 1.3 mg/dL 0.8 0.96 1.00  Sodium 136 - 145 mEq/L 141 139 138  Potassium 3.5 - 5.1 mEq/L 4.2 4.3 4.7  Chloride 96 - 112 mEq/L - 107 107  CO2 22 - 29 mEq/L 21(L) 21 24  Calcium 8.4 - 10.4 mg/dL 8.9 9.5 9.1  Total Protein 6.4 - 8.3 g/dL 7.3 7.4 7.2  Total Bilirubin 0.20 - 1.20 mg/dL 0.30 0.3 0.3  Alkaline Phos 40 - 150 U/L 60 31(L) 33(L)  AST 5 - 34 U/L 17 19 23   ALT 0 - 55 U/L 18 17 19     SPEP pending Serum free light chains pending   RADIOGRAPHIC STUDIES: I have personally reviewed the radiological images as listed and agreed with the findings in the report. No results found.  ASSESSMENT & PLAN:   69 year old African-American gentleman with hepatitis C with  #1 MGUS - last SPEP showed some restricted mobility in the IgG lambda and IgG  kappa lanes no overtly measurable monoclonal protein. This could very well be due to his hepatitis C. No anemia, renal failure, hypercalcemia or bone pains. Plan -Follow-up pending myeloma markers and serum free light chains done in clinic today. -Patient given the option of following with his primary care physician worse is returning to care in one year and he chooses to come back to the oncology clinic in one year to track this.  #2 hepatitis C status post treatment. Viral load undetectable on 05/18/2014. Plan -Continue follow-up with primary care physician and hepatitis C clinic. -Continue hepatocellular carcinoma screening with ultrasound of abdomen and AFP tumor marker every 6 months with primary care physician.  #3 health promotion -Counseled the patient on smoking cessation and provided multiple ideas. Patient notes that he wants  to try on his own to cut down smoking. -Counseled on limiting alcohol intake.  Return to care with Dr. Irene Limbo in 12 months with repeat CBC, CMP, SPEP. Earlier if any new questions or concerns arise. Continue follow-up with primary care physician  I spent 10 minutes counseling the patient face to face. The total time spent in the appointment was 15 minutes and more than 50% was on counseling and direct patient cares.    Sullivan Lone MD [place] AAHIVMS [facility] Hematology/Oncology Physician [facility]  (Office):       [phone] (Work cell):  [phone] (Fax):           [phone]

## 2014-11-16 ENCOUNTER — Encounter: Payer: Self-pay | Admitting: Physician Assistant

## 2014-11-16 ENCOUNTER — Ambulatory Visit (INDEPENDENT_AMBULATORY_CARE_PROVIDER_SITE_OTHER): Payer: Medicare Other | Admitting: Physician Assistant

## 2014-11-16 VITALS — BP 114/76 | HR 61 | Temp 98.1°F | Resp 16 | Ht 69.5 in | Wt 239.8 lb

## 2014-11-16 DIAGNOSIS — E1122 Type 2 diabetes mellitus with diabetic chronic kidney disease: Secondary | ICD-10-CM

## 2014-11-16 DIAGNOSIS — E785 Hyperlipidemia, unspecified: Secondary | ICD-10-CM | POA: Diagnosis not present

## 2014-11-16 DIAGNOSIS — I10 Essential (primary) hypertension: Secondary | ICD-10-CM | POA: Diagnosis not present

## 2014-11-16 DIAGNOSIS — N183 Chronic kidney disease, stage 3 (moderate): Secondary | ICD-10-CM

## 2014-11-16 DIAGNOSIS — Z6834 Body mass index (BMI) 34.0-34.9, adult: Secondary | ICD-10-CM | POA: Diagnosis not present

## 2014-11-16 DIAGNOSIS — R809 Proteinuria, unspecified: Secondary | ICD-10-CM

## 2014-11-16 LAB — KAPPA/LAMBDA LIGHT CHAINS
Kappa free light chain: 3.16 mg/dL — ABNORMAL HIGH (ref 0.33–1.94)
Kappa:Lambda Ratio: 1.19 (ref 0.26–1.65)
Lambda Free Lght Chn: 2.65 mg/dL — ABNORMAL HIGH (ref 0.57–2.63)

## 2014-11-16 LAB — SPEP & IFE WITH QIG
Albumin ELP: 4.1 g/dL (ref 3.8–4.8)
Alpha-1-Globulin: 0.3 g/dL (ref 0.2–0.3)
Alpha-2-Globulin: 0.9 g/dL (ref 0.5–0.9)
Beta 2: 0.3 g/dL (ref 0.2–0.5)
Beta Globulin: 0.5 g/dL (ref 0.4–0.6)
Gamma Globulin: 1.3 g/dL (ref 0.8–1.7)
IGG (IMMUNOGLOBIN G), SERUM: 1410 mg/dL (ref 650–1600)
IgA: 261 mg/dL (ref 68–379)
IgM, Serum: 42 mg/dL (ref 41–251)
Total Protein, Serum Electrophoresis: 7.2 g/dL (ref 6.1–8.1)

## 2014-11-16 LAB — POCT GLYCOSYLATED HEMOGLOBIN (HGB A1C): HEMOGLOBIN A1C: 6.4

## 2014-11-16 LAB — LIPID PANEL
Cholesterol: 147 mg/dL (ref 125–200)
HDL: 26 mg/dL — ABNORMAL LOW (ref >=40)
LDL CALC: 99 mg/dL (ref <130)
TRIGLYCERIDES: 109 mg/dL (ref <150)
Total CHOL/HDL Ratio: 5.7 Ratio — ABNORMAL HIGH (ref <=5.0)
VLDL: 22 mg/dL (ref <30)

## 2014-11-16 LAB — MICROALBUMIN, URINE: Microalb, Ur: 7.4 mg/dL

## 2014-11-16 LAB — GLUCOSE, POCT (MANUAL RESULT ENTRY): POC Glucose: 122 mg/dl — AB (ref 70–99)

## 2014-11-16 LAB — HEMOGLOBIN A1C: HEMOGLOBIN A1C: 6.4 % — AB (ref 4.0–6.0)

## 2014-11-16 NOTE — Progress Notes (Signed)
Patient ID: Gregg Flores, male    DOB: Feb 16, 1945, 69 y.o.   MRN: [id number]  PCP: Teeghan Hammer, PA-C  Subjective:   Chief Complaint  Patient presents with  . Follow-up  . Diabetes  . Medication Refill    all meds    HPI Presents for evaluation of diabetes, HTN and hyperlipidemia.  He is doing well, without problems or concerns.  He continues to work on healthy eating, getting minimally more exercise.  Diabetes and HTN have been controlled. He has stopped the fenofibrate, and if today's labs indicate he needs lipid lowering, plan to increase the pravastatin from 40 mg to 80 mg.  Tolerating current medications without difficulty.  Continues to work on cutting back on tobacco.  He saw hematology last week for his annual evaluation of monoclonal gammopathy. CMET and CBC look stable. Other labs are pending.  Frequency of home glucose monitoring: occasionally. Home visit by [place] revealed glucosuria and a fingerstick of 124. Sees a dentist Q6 months, eye specialist annually (appointment in December). Checks feet daily. Is current with influenza vaccine. Is current with pneumococcal vaccine.   Review of Systems  Constitutional: Negative for activity change, appetite change, fatigue and unexpected weight change.  HENT: Negative for congestion, dental problem, ear pain, hearing loss, mouth sores, postnasal drip, rhinorrhea, sneezing, sore throat, tinnitus and trouble swallowing.   Eyes: Negative for photophobia, pain, redness and visual disturbance.  Respiratory: Negative for cough, chest tightness and shortness of breath.   Cardiovascular: Negative for chest pain, palpitations and leg swelling.  Gastrointestinal: Negative for nausea, vomiting, abdominal pain, diarrhea, constipation and blood in stool.  Genitourinary: Negative for dysuria, urgency, frequency and hematuria.  Musculoskeletal: Positive for back pain. Negative for myalgias, arthralgias, gait problem and  neck stiffness.  Skin: Negative for rash.  Neurological: Negative for dizziness, speech difficulty, weakness, light-headedness, numbness and headaches.  Hematological: Negative for adenopathy.  Psychiatric/Behavioral: Negative for confusion and sleep disturbance. The patient is not nervous/anxious.        Patient Active Problem List   Diagnosis Date Noted  . BMI 34.0-34.9,adult 05/18/2014  . History of hepatitis C 06/15/2012  . Hyperlipidemia LDL goal <70 04/06/2012  . Tobacco use 09/25/2011  . Essential hypertension, benign   . Chronic LBP   . Type 2 diabetes mellitus with renal manifestations ([place])   . Diverticulosis   . Benign prostatic hyperplasia with urinary obstruction   . Monoclonal gammopathy   . Cataracts, bilateral   . Erectile dysfunction due to arterial insufficiency   . Microalbuminuria   . Benign neoplasm of colon 07/01/2008     Prior to Admission medications   Medication Sig Start Date End Date Taking? Authorizing Provider  amLODipine (NORVASC) 10 MG tablet TAKE 1 TABLET BY MOUTH ONCE DAILY. 02/09/14  Yes Stehanie Ekstrom, PA-C  benazepril (LOTENSIN) 40 MG tablet TAKE 1 TABLET BY MOUTH ONCE DAILY. 02/09/14  Yes Azora Bonzo, PA-C  hydrochlorothiazide (HYDRODIURIL) 25 MG tablet TAKE 1 TABLET BY MOUTH EVERY MORNING 02/09/14  Yes Kweli Grassel, PA-C  HYDROcodone-acetaminophen (NORCO) 7.5-325 MG per tablet Take 1 tablet by mouth every 6 (six) hours as needed. 05/18/14  Yes Love Milbourne, PA-C  ipratropium (ATROVENT) 0.03 % nasal spray Place 2 sprays into the nose every 12 (twelve) hours. 05/18/14  Yes Moneisha Vosler, PA-C  meloxicam (MOBIC) 15 MG tablet TAKE 1 TABLET BY MOUTH ONCE DAILY AS NEEDED FOR PAIN. DO  NOT USE WITH ALEVE 02/09/14  Yes Helana Macbride, PA-C  metFORMIN (GLUCOPHAGE)  1000 MG tablet Take 1 tablet (1,000 mg total) by mouth 2 (two) times daily with a meal. 02/09/14  Yes Jandiel Magallanes, PA-C  pravastatin (PRAVACHOL) 40 MG tablet TAKE 1 TABLET BY MOUTH  EVERY EVENING . 02/09/14  Yes Khaleed Holan, PA-C  RA ASPIRIN ADULT LOW STRENGTH 81 MG EC tablet take 1 tablet by mouth once daily 09/30/12  Yes Paola Aleshire, PA-C     No Known Allergies     Objective:  Physical Exam  Constitutional: He is oriented to person, place, and time. Vital signs are normal. He appears well-developed and well-nourished. He is active and cooperative. No distress.  BP 114/76 mmHg  Pulse 61  Temp(Src) 98.1 F (36.7 C) (Oral)  Resp 16  Ht 5' 9.5" (1.765 m)  Wt 239 lb 12.8 oz (108.773 kg)  BMI 34.92 kg/m2  SpO2 98%  HENT:  Head: Normocephalic and atraumatic.  Right Ear: Hearing normal.  Left Ear: Hearing normal.  Eyes: Conjunctivae are normal. No scleral icterus.  Neck: Normal range of motion. Neck supple. No thyromegaly present.  Cardiovascular: Normal rate, regular rhythm and normal heart sounds.   Pulses:      Radial pulses are 2+ on the right side, and 2+ on the left side.  Pulmonary/Chest: Effort normal and breath sounds normal.  Lymphadenopathy:       Head (right side): No tonsillar, no preauricular, no posterior auricular and no occipital adenopathy present.       Head (left side): No tonsillar, no preauricular, no posterior auricular and no occipital adenopathy present.    He has no cervical adenopathy.       Right: No supraclavicular adenopathy present.       Left: No supraclavicular adenopathy present.  Neurological: He is alert and oriented to person, place, and time. No sensory deficit.  Skin: Skin is warm, dry and intact. No rash noted. No cyanosis or erythema. Nails show no clubbing.  Psychiatric: He has a normal mood and affect. His speech is normal and behavior is normal.    Results for orders placed or performed in visit on 11/16/14  POCT glucose (manual entry)  Result Value Ref Range   POC Glucose 122 (A) 70 - 99 mg/dl  POCT glycosylated hemoglobin (Hb A1C)  Result Value Ref Range   Hemoglobin A1C 6.4           Assessment &  Plan:   1. Type 2 diabetes mellitus with stage 3 chronic kidney disease, without long-term current use of insulin (HCC) Controlled. Continue current treatment. - POCT glucose (manual entry) - POCT glycosylated hemoglobin (Hb A1C)  2. Microalbuminuria Await labs. Has been stable at last several visits. - Microalbumin, urine  3. Hyperlipidemia LDL goal <70 Await lab results. Increase pravastatin from 40 mg to 80 mg if LDL not to goal. - Lipid panel  4. Essential hypertension, benign Controlled. Continue working on healthy lifestyle changes, including smoking cessation.  5. BMI 34.0-34.9,adult Counseled on healthy eating, regular exercise.   Fara Chute, PA-C Physician Assistant-Certified [facility]

## 2014-11-16 NOTE — Patient Instructions (Signed)
I will contact you with your lab results as soon as they are available.   If you have not heard from me in 2 weeks, please contact me.  The fastest way to get your results is to register for My Chart (see the instructions on the last page of this printout).   

## 2014-11-19 ENCOUNTER — Encounter: Payer: Self-pay | Admitting: Physician Assistant

## 2014-11-22 ENCOUNTER — Encounter: Payer: Self-pay | Admitting: Family Medicine

## 2014-12-06 IMAGING — US US ABDOMEN LIMITED
1 series · 14 of 25 positions shown · non-contrast
Comparison: None.

CLINICAL DATA: Hepatitis C, hepatoma surveillance

LIMITED ABDOMINAL ULTRASOUND - RIGHT UPPER QUADRANT

[Series 1: us abdomen limited · 0.30mm/px · 14 of 42 slices shown]
[im 1/42]
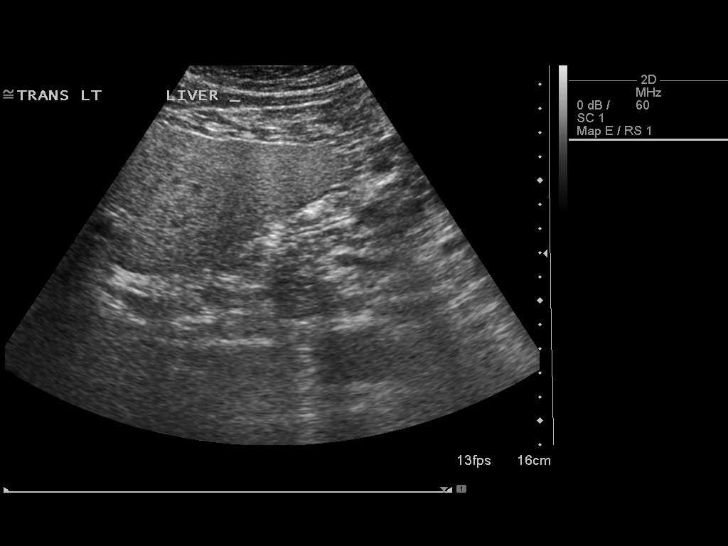
[im 4/42]
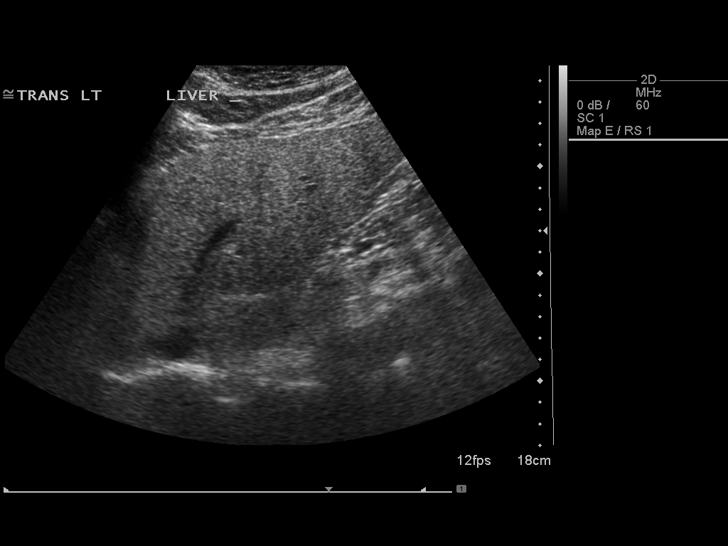
[im 7/42]
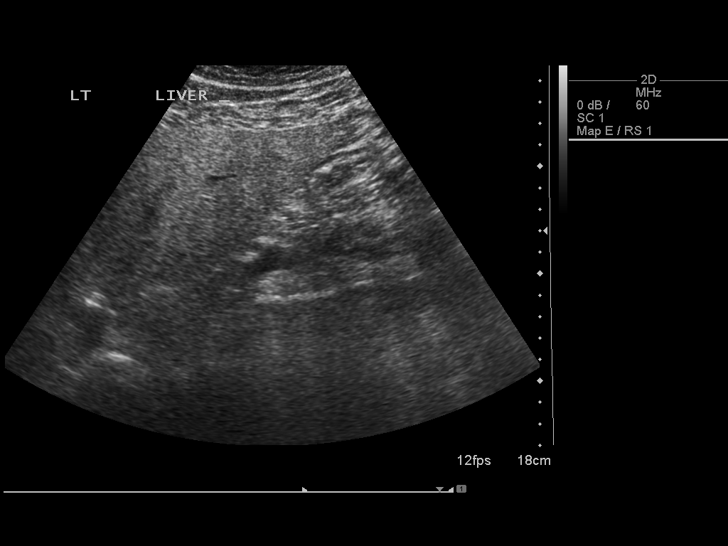
[im 11/42]
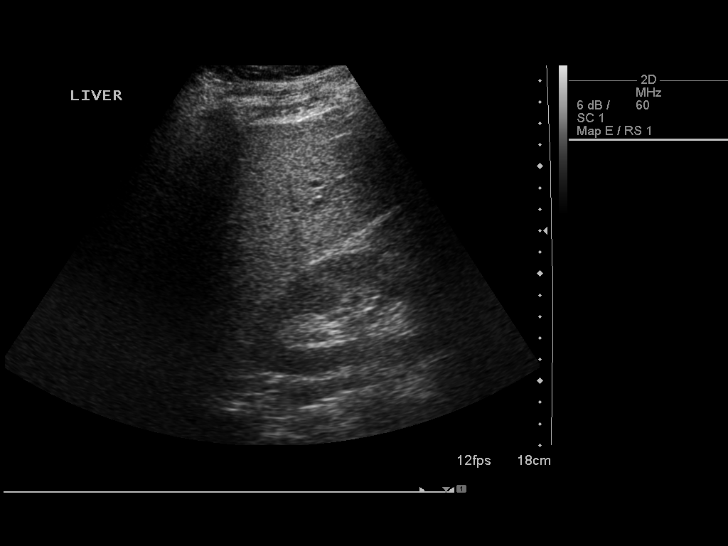
[im 14/42]
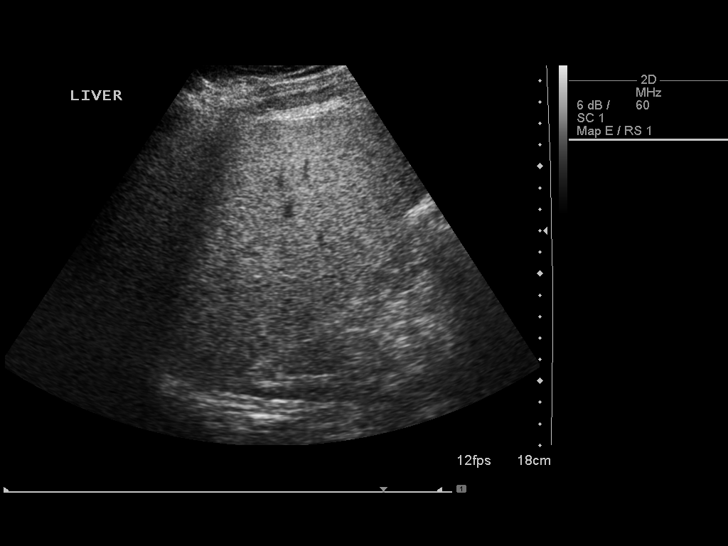
[im 16/42]
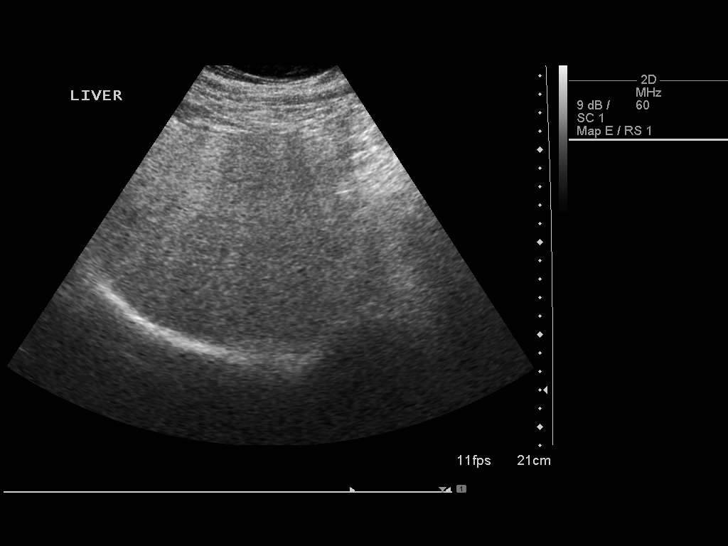
[im 19/42]
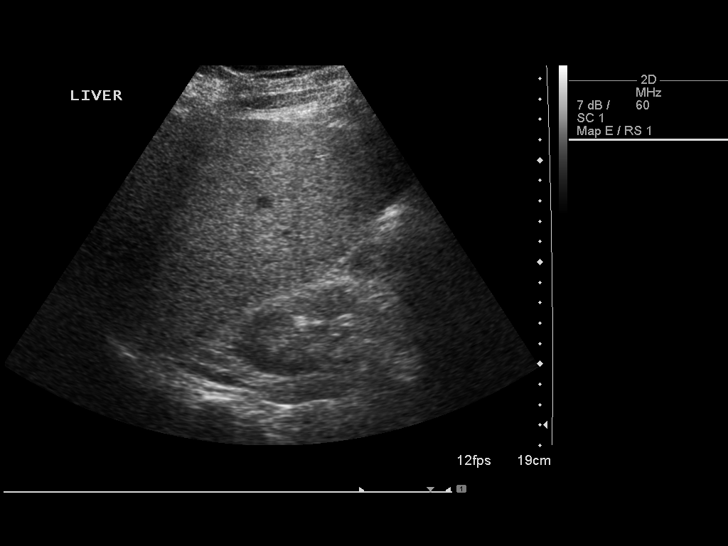
[im 23/42]
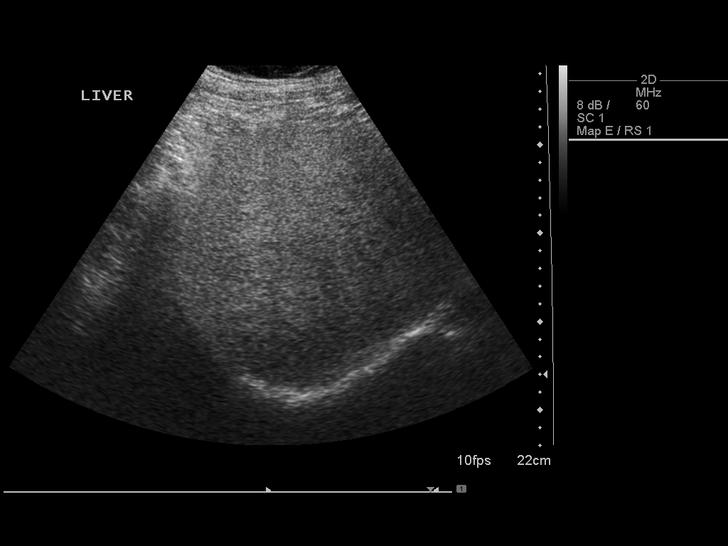
[im 26/42]
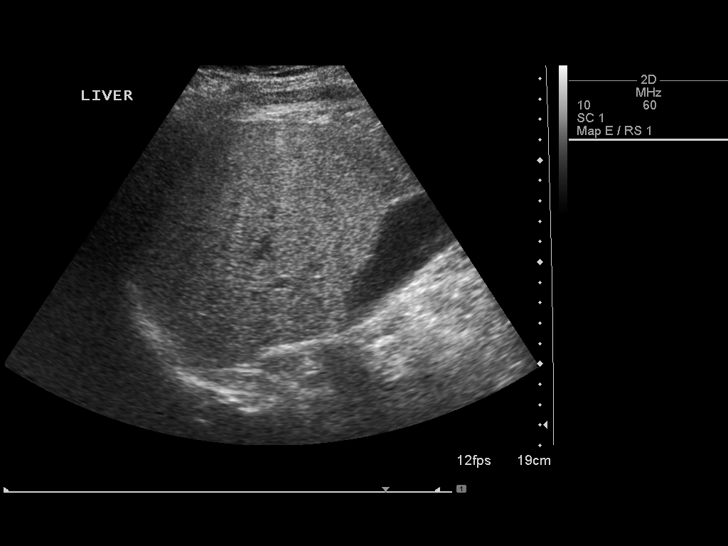
[im 28/42]
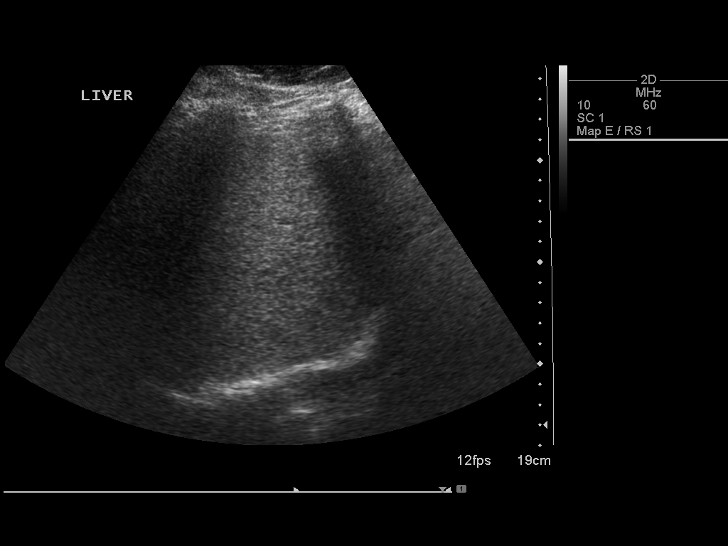
[im 31/42]
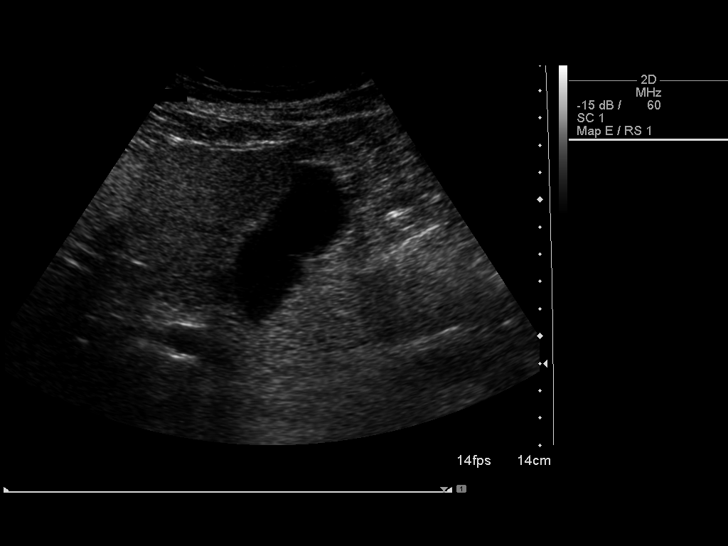
[im 35/42]
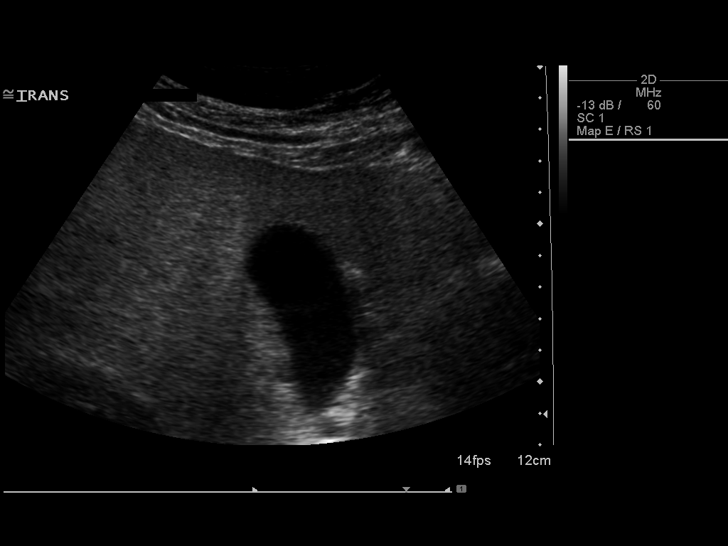
[im 38/42]
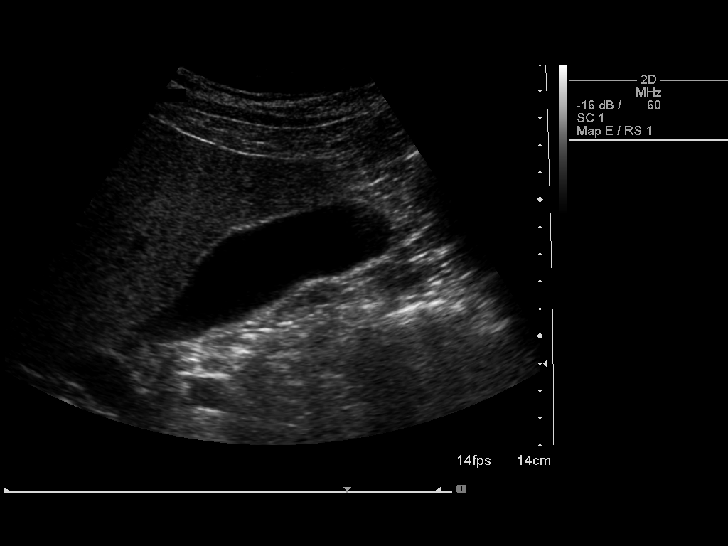
[im 42/42]
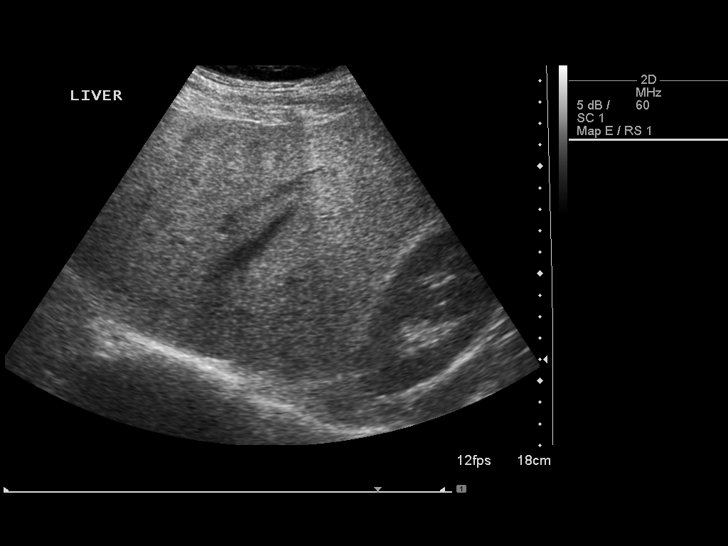

[14 of 25 positions shown; findings below may reference images not displayed]

FINDINGS: Gallbladder:  The gallbladder is well visualized and no gallstones
are seen.  There is no pain over the gallbladder with compression.
.

Common bile duct:   The common bile duct is normal measuring 2.9 mm
in diameter

Liver:  The liver is diffusely echogenic, consistent with fatty
infiltration.  No focal abnormality is seen.
IMPRESSION: 1.  Very echogenic liver parenchyma consistent with diffuse fatty
infiltration.  No focal hepatic lesion.
2.  No gallstones.

## 2015-01-19 ENCOUNTER — Ambulatory Visit: Payer: Medicare Other | Admitting: Podiatry

## 2015-01-19 DIAGNOSIS — E119 Type 2 diabetes mellitus without complications: Secondary | ICD-10-CM | POA: Diagnosis not present

## 2015-01-19 DIAGNOSIS — H04123 Dry eye syndrome of bilateral lacrimal glands: Secondary | ICD-10-CM | POA: Diagnosis not present

## 2015-01-19 DIAGNOSIS — H2513 Age-related nuclear cataract, bilateral: Secondary | ICD-10-CM | POA: Diagnosis not present

## 2015-01-19 LAB — HM DIABETES EYE EXAM

## 2015-01-25 ENCOUNTER — Encounter: Payer: Self-pay | Admitting: Family Medicine

## 2015-01-26 ENCOUNTER — Ambulatory Visit (INDEPENDENT_AMBULATORY_CARE_PROVIDER_SITE_OTHER): Payer: Medicare Other | Admitting: Podiatry

## 2015-01-26 ENCOUNTER — Encounter: Payer: Self-pay | Admitting: Podiatry

## 2015-01-26 DIAGNOSIS — B351 Tinea unguium: Secondary | ICD-10-CM | POA: Diagnosis not present

## 2015-01-26 DIAGNOSIS — M79676 Pain in unspecified toe(s): Secondary | ICD-10-CM | POA: Diagnosis not present

## 2015-01-26 NOTE — Progress Notes (Signed)
Patient ID: Gregg Flores, male   DOB: [birth year], 69 y.o.   MRN: [id number]  Subjective: This patient presents for scheduled visit complaining of elongated and thickened toenails which are uncomfortable when he wears shoes and walks and is requesting toenail debridement  Objective: Orientated 3 No open skin lesions bilaterally The toenails are hypertrophic, elongated, discolored, incurvated and tender to direct palpation 6-10  Assessment: Symptomatic onychomycoses 6-10 Diabetic without complications  Plan: Debridement toenails 6-10 mechanically and electronically without any bleeding  Reappoint 3 months

## 2015-01-26 NOTE — Patient Instructions (Signed)
The big toenail on the left foot rub the callused in the nail groove. Apply Vaseline and a Band-Aid to his daily until the area feels comfortable  Diabetes and Foot Care Diabetes may cause you to have problems because of poor blood supply (circulation) to your feet and legs. This may cause the skin on your feet to become thinner, break easier, and heal more slowly. Your skin may become dry, and the skin may peel and crack. You may also have nerve damage in your legs and feet causing decreased feeling in them. You may not notice minor injuries to your feet that could lead to infections or more serious problems. Taking care of your feet is one of the most important things you can do for yourself.  HOME CARE INSTRUCTIONS  Wear shoes at all times, even in the house. Do not go barefoot. Bare feet are easily injured.  Check your feet daily for blisters, cuts, and redness. If you cannot see the bottom of your feet, use a mirror or ask someone for help.  Wash your feet with warm water (do not use hot water) and mild soap. Then pat your feet and the areas between your toes until they are completely dry. Do not soak your feet as this can dry your skin.  Apply a moisturizing lotion or petroleum jelly (that does not contain alcohol and is unscented) to the skin on your feet and to dry, brittle toenails. Do not apply lotion between your toes.  Trim your toenails straight across. Do not dig under them or around the cuticle. File the edges of your nails with an emery board or nail file.  Do not cut corns or calluses or try to remove them with medicine.  Wear clean socks or stockings every day. Make sure they are not too tight. Do not wear knee-high stockings since they may decrease blood flow to your legs.  Wear shoes that fit properly and have enough cushioning. To break in new shoes, wear them for just a few hours a day. This prevents you from injuring your feet. Always look in your shoes before you put  them on to be sure there are no objects inside.  Do not cross your legs. This may decrease the blood flow to your feet.  If you find a minor scrape, cut, or break in the skin on your feet, keep it and the skin around it clean and dry. These areas may be cleansed with mild soap and water. Do not cleanse the area with peroxide, alcohol, or iodine.  When you remove an adhesive bandage, be sure not to damage the skin around it.  If you have a wound, look at it several times a day to make sure it is healing.  Do not use heating pads or hot water bottles. They may burn your skin. If you have lost feeling in your feet or legs, you may not know it is happening until it is too late.  Make sure your health care provider performs a complete foot exam at least annually or more often if you have foot problems. Report any cuts, sores, or bruises to your health care provider immediately. SEEK MEDICAL CARE IF:   You have an injury that is not healing.  You have cuts or breaks in the skin.  You have an ingrown nail.  You notice redness on your legs or feet.  You feel burning or tingling in your legs or feet.  You have pain or cramps in  your legs and feet.  Your legs or feet are numb.  Your feet always feel cold. SEEK IMMEDIATE MEDICAL CARE IF:   There is increasing redness, swelling, or pain in or around a wound.  There is a red line that goes up your leg.  Pus is coming from a wound.  You develop a fever or as directed by your health care provider.  You notice a bad smell coming from an ulcer or wound.   This information is not intended to replace advice given to you by your health care provider. Make sure you discuss any questions you have with your health care provider.   Document Released: 01/13/2000 Document Revised: 09/17/2012 Document Reviewed: 06/24/2012 Elsevier Interactive Patient Education [employer].

## 2015-02-07 ENCOUNTER — Other Ambulatory Visit: Payer: Self-pay | Admitting: Family Medicine

## 2015-02-08 ENCOUNTER — Telehealth: Payer: Self-pay | Admitting: Family Medicine

## 2015-02-08 DIAGNOSIS — M545 Low back pain, unspecified: Secondary | ICD-10-CM

## 2015-02-08 DIAGNOSIS — G8929 Other chronic pain: Secondary | ICD-10-CM

## 2015-02-08 DIAGNOSIS — I10 Essential (primary) hypertension: Secondary | ICD-10-CM

## 2015-02-08 DIAGNOSIS — E785 Hyperlipidemia, unspecified: Secondary | ICD-10-CM

## 2015-02-08 DIAGNOSIS — E1122 Type 2 diabetes mellitus with diabetic chronic kidney disease: Secondary | ICD-10-CM

## 2015-02-08 MED ORDER — MELOXICAM 15 MG PO TABS: ORAL_TABLET | ORAL | Status: DC

## 2015-02-08 MED ORDER — METFORMIN HCL 1000 MG PO TABS: 1000.0000 mg | ORAL_TABLET | Freq: Two times a day (BID) | ORAL | Status: DC

## 2015-02-08 MED ORDER — HYDROCHLOROTHIAZIDE 25 MG PO TABS: ORAL_TABLET | ORAL | Status: DC

## 2015-02-08 MED ORDER — PRAVASTATIN SODIUM 40 MG PO TABS: ORAL_TABLET | ORAL | Status: DC

## 2015-02-08 MED ORDER — BENAZEPRIL HCL 40 MG PO TABS: ORAL_TABLET | ORAL | Status: DC

## 2015-02-08 MED ORDER — AMLODIPINE BESYLATE 10 MG PO TABS: ORAL_TABLET | ORAL | Status: DC

## 2015-02-08 NOTE — Telephone Encounter (Signed)
Pt.notified

## 2015-02-08 NOTE — Telephone Encounter (Signed)
Pt comes into office for a need of his medicines he's almost out of BP medicine only eight pills please sent medicines over to [facility] on [address] call pt at [phone] when RX has been sent over

## 2015-02-08 NOTE — Telephone Encounter (Signed)
Meds ordered this encounter  Medications  . pravastatin (PRAVACHOL) 40 MG tablet    Sig: TAKE 1 TABLET BY MOUTH EVERY EVENING .    Dispense:  90 tablet    Refill:  3    Order Specific Question:  Supervising Provider    Answer:  DOOLITTLE, ROBERT P [id number]  . metFORMIN (GLUCOPHAGE) 1000 MG tablet    Sig: Take 1 tablet (1,000 mg total) by mouth 2 (two) times daily with a meal.    Dispense:  180 tablet    Refill:  3    Order Specific Question:  Supervising Provider    Answer:  DOOLITTLE, ROBERT P [id number]  . meloxicam (MOBIC) 15 MG tablet    Sig: TAKE 1 TABLET BY MOUTH ONCE DAILY AS NEEDED FOR PAIN. DO  NOT USE WITH ALEVE    Dispense:  90 tablet    Refill:  3    Order Specific Question:  Supervising Provider    Answer:  DOOLITTLE, ROBERT P [id number]  . hydrochlorothiazide (HYDRODIURIL) 25 MG tablet    Sig: TAKE 1 TABLET BY MOUTH EVERY MORNING    Dispense:  90 tablet    Refill:  3    Order Specific Question:  Supervising Provider    Answer:  DOOLITTLE, ROBERT P [id number]  . benazepril (LOTENSIN) 40 MG tablet    Sig: TAKE 1 TABLET BY MOUTH ONCE DAILY.    Dispense:  90 tablet    Refill:  3    Order Specific Question:  Supervising Provider    Answer:  DOOLITTLE, ROBERT P [id number]  . amLODipine (NORVASC) 10 MG tablet    Sig: TAKE 1 TABLET BY MOUTH ONCE DAILY.    Dispense:  90 tablet    Refill:  3    Order Specific Question:  Supervising Provider    Answer:  DOOLITTLE, ROBERT P [id number]

## 2015-02-21 ENCOUNTER — Encounter: Payer: Self-pay | Admitting: Family Medicine

## 2015-03-01 ENCOUNTER — Encounter: Payer: Self-pay | Admitting: Physician Assistant

## 2015-03-01 ENCOUNTER — Ambulatory Visit (INDEPENDENT_AMBULATORY_CARE_PROVIDER_SITE_OTHER): Payer: Medicare Other | Admitting: Physician Assistant

## 2015-03-01 VITALS — BP 128/84 | HR 67 | Temp 98.2°F | Resp 16 | Ht 69.5 in | Wt 240.4 lb

## 2015-03-01 DIAGNOSIS — E785 Hyperlipidemia, unspecified: Secondary | ICD-10-CM | POA: Diagnosis not present

## 2015-03-01 DIAGNOSIS — N183 Chronic kidney disease, stage 3 unspecified: Secondary | ICD-10-CM

## 2015-03-01 DIAGNOSIS — I10 Essential (primary) hypertension: Secondary | ICD-10-CM | POA: Diagnosis not present

## 2015-03-01 DIAGNOSIS — E1122 Type 2 diabetes mellitus with diabetic chronic kidney disease: Secondary | ICD-10-CM

## 2015-03-01 DIAGNOSIS — Z720 Tobacco use: Secondary | ICD-10-CM | POA: Diagnosis not present

## 2015-03-01 LAB — LIPID PANEL
CHOLESTEROL: 145 mg/dL (ref 125–200)
HDL: 27 mg/dL — AB (ref >=40)
LDL Cholesterol: 91 mg/dL (ref <130)
TRIGLYCERIDES: 136 mg/dL (ref <150)
Total CHOL/HDL Ratio: 5.4 Ratio — ABNORMAL HIGH (ref <=5.0)
VLDL: 27 mg/dL (ref <30)

## 2015-03-01 LAB — COMPREHENSIVE METABOLIC PANEL
ALBUMIN: 4.4 g/dL (ref 3.6–5.1)
ALT: 17 U/L (ref 9–46)
AST: 18 U/L (ref 10–35)
Alkaline Phosphatase: 55 U/L (ref 40–115)
BUN: 14 mg/dL (ref 7–25)
CALCIUM: 9.4 mg/dL (ref 8.6–10.3)
CHLORIDE: 103 mmol/L (ref 98–110)
CO2: 22 mmol/L (ref 20–31)
Creat: 0.86 mg/dL (ref 0.70–1.18)
Glucose, Bld: 103 mg/dL — ABNORMAL HIGH (ref 65–99)
POTASSIUM: 4.3 mmol/L (ref 3.5–5.3)
Sodium: 138 mmol/L (ref 135–146)
TOTAL PROTEIN: 7.5 g/dL (ref 6.1–8.1)
Total Bilirubin: 0.4 mg/dL (ref 0.2–1.2)

## 2015-03-01 LAB — HEMOGLOBIN A1C
HEMOGLOBIN A1C: 7.2 % — AB (ref <5.7)
MEAN PLASMA GLUCOSE: 160 mg/dL — AB (ref <117)

## 2015-03-01 MED ORDER — PRAVASTATIN SODIUM 80 MG PO TABS: 80.0000 mg | ORAL_TABLET | Freq: Every day | ORAL | Status: DC

## 2015-03-01 NOTE — Progress Notes (Signed)
Patient ID: Gregg Flores, male    DOB: 02/12/1945, 70 y.o.   MRN: [id number]  PCP: Trenity Pha, PA-C  Subjective:   Chief Complaint  Patient presents with  . Diabetes    HPI Presents for evaluation of HTN, HLD, and DM2 with chronic kidney disease.   Is doing well, without problems or concerns.   He says everything is about the same. Says sometimes he is tired when he wakes up.   Working on eating healthy. Mainly vegetables, and meat consisting of pork, eggs, . Says sometimes he gets hungry and will eat whatever he has infront of him.   Exercise he gets through his normal ADL's, such as walking to the store (2 miles), cutting the grass etc.   Saw eye doctor on 01/26/15. Said that he saw a little glaucoma.   Says he bases his blood sugar on hunger. Only monitors on occasion. When he does check it runs around 125.  Wears dentures, hasn't been to dentist in 1 year.   Checks feet whenever he is in the showers. Checks between the toes. Has a podiatrist he is seeing for toenails.   No complaints with medications. Not taking his Pravastatin, thought he was told to stop taking it.   Planned last time: He has stopped the fenofibrate, and if today's labs indicate he needs lipid lowering, plan to increase the pravastatin from 40 mg to 80 mg.  Smoking cessation. Has spent 200-300 on nicotine patches, for the last 9 months. He says he can feel it wearing off, smokes about a pack a day.     Review of Systems Constitutional: Negative for appetite change, fatigue and unexpected weight change.  HENT: Negative for dental problem, ear pain, sore throat, tinnitus and trouble swallowing.  Eyes: Negative for pain, redness and visual disturbance.  Respiratory: Negative for chest tightness and shortness of breath.  Cardiovascular: Negative for chest pain, palpitations and leg swelling.  Gastrointestinal: Negative for nausea, vomiting and abdominal pain.  Endocrine: Negative for  polydipsia, polyphagia and polyuria.  Genitourinary: Negative for urgency, frequency and difficulty urinating.  Skin: Negative for color change and rash.  Neurological: Negative for dizziness, syncope and light-headedness.  Hematological: Does not bruise/bleed easily.     Patient Active Problem List   Diagnosis Date Noted  . BMI 34.0-34.9,adult 05/18/2014  . History of hepatitis C 06/15/2012  . Hyperlipidemia LDL goal <70 04/06/2012  . Tobacco use 09/25/2011  . Essential hypertension, benign   . Chronic LBP   . Type 2 diabetes mellitus with renal manifestations ([place])   . Diverticulosis   . Benign prostatic hyperplasia with urinary obstruction   . Monoclonal gammopathy   . Cataracts, bilateral   . Erectile dysfunction due to arterial insufficiency   . Microalbuminuria   . Benign neoplasm of colon 07/01/2008     Prior to Admission medications   Medication Sig Start Date End Date Taking? Authorizing Provider  amLODipine (NORVASC) 10 MG tablet TAKE 1 TABLET BY MOUTH ONCE DAILY. 02/08/15  Yes Governor Matos, PA-C  benazepril (LOTENSIN) 40 MG tablet TAKE 1 TABLET BY MOUTH ONCE DAILY. 02/08/15  Yes Tamikia Chowning, PA-C  hydrochlorothiazide (HYDRODIURIL) 25 MG tablet TAKE 1 TABLET BY MOUTH EVERY MORNING 02/08/15  Yes Claudell Rhody, PA-C  HYDROcodone-acetaminophen (NORCO) 7.5-325 MG per tablet Take 1 tablet by mouth every 6 (six) hours as needed. 05/18/14  Yes Alyan Hartline, PA-C  ipratropium (ATROVENT) 0.03 % nasal spray Place 2 sprays into the nose every 12 (twelve) hours.  05/18/14  Yes Corena Tilson, PA-C  meloxicam (MOBIC) 15 MG tablet TAKE 1 TABLET BY MOUTH ONCE DAILY AS NEEDED FOR PAIN. DO  NOT USE WITH ALEVE 02/08/15  Yes Dakisha Schoof, PA-C  metFORMIN (GLUCOPHAGE) 1000 MG tablet Take 1 tablet (1,000 mg total) by mouth 2 (two) times daily with a meal. 02/08/15  Yes Troy Hartzog, PA-C  pravastatin (PRAVACHOL) 40 MG tablet TAKE 1 TABLET BY MOUTH EVERY EVENING . 02/08/15  Yes Jaceyon Strole, PA-C  RA ASPIRIN ADULT LOW STRENGTH 81 MG EC tablet take 1 tablet by mouth once daily 09/30/12  Yes Lulamae Skorupski, PA-C     No Known Allergies     Objective:  Physical Exam  Constitutional: He is oriented to person, place, and time. Vital signs are normal. He appears well-developed and well-nourished. He is active and cooperative. No distress.  BP 128/84 mmHg  Pulse 67  Temp(Src) 98.2 F (36.8 C) (Oral)  Resp 16  Ht 5' 9.5" (1.765 m)  Wt 240 lb 6.4 oz (109.045 kg)  BMI 35.00 kg/m2  SpO2 97%  HENT:  Head: Normocephalic and atraumatic.  Right Ear: Hearing normal.  Left Ear: Hearing normal.  Mouth/Throat: Oropharynx is clear and moist and mucous membranes are normal. He has dentures (fully compensated edentula).  Eyes: Conjunctivae are normal. No scleral icterus.  Neck: Normal range of motion. Neck supple. No thyromegaly present.  Cardiovascular: Normal rate, regular rhythm and normal heart sounds.   Pulses:      Radial pulses are 2+ on the right side, and 2+ on the left side.  Pulmonary/Chest: Effort normal and breath sounds normal.  Lymphadenopathy:       Head (right side): No tonsillar, no preauricular, no posterior auricular and no occipital adenopathy present.       Head (left side): No tonsillar, no preauricular, no posterior auricular and no occipital adenopathy present.    He has no cervical adenopathy.       Right: No supraclavicular adenopathy present.       Left: No supraclavicular adenopathy present.  Neurological: He is alert and oriented to person, place, and time. No sensory deficit.  Skin: Skin is warm, dry and intact. No rash noted. No cyanosis or erythema. Nails show no clubbing.  Psychiatric: He has a normal mood and affect. His speech is normal and behavior is normal.           Assessment & Plan:   1. Type 2 diabetes mellitus with stage 3 chronic kidney disease, without long-term current use of insulin ([place]) Await lab results. Healthy eating,  regular exercise encouraged. - [facility] Diabetes Foot Exam - Comprehensive metabolic panel - Hemoglobin A1c  2. Hyperlipidemia LDL goal <70 Await lab results. INCREASE pravastatin to 80 mg, as fenofibrate was stopped. - pravastatin (PRAVACHOL) 80 MG tablet; Take 1 tablet (80 mg total) by mouth daily.  Dispense: 90 tablet; Refill: 3 - Comprehensive metabolic panel - Lipid panel  3. Essential hypertension, benign Controlled. - Comprehensive metabolic panel  4. Tobacco use Encouraged smoking cessation. Again. He isn't really ready.   Fara Chute, PA-C Physician Assistant-Certified [facility]

## 2015-03-01 NOTE — Patient Instructions (Addendum)
I will contact you with your lab results as soon as they are available.   If you have not heard from me in 2 weeks, please contact me.  The fastest way to get your results is to register for My Chart (see the instructions on the last page of this printout).  Please take the pravastatin (we stopped the fenofibrate). You need to take 80 mg. You can use up the 40 mg tablets that you have by taking 2 of them together each day. Then, switch to the new prescription for the 80 mg tablets and take one each day.  Try to have healthy snacks at home for when you get hungry. This includes things like yogurt, cheese sticks, carrots, celery sticks, etc.   Monitor your glucose at home more frequently. Record the numbers in a journal and bring it with you during your next visit.   Next time you come to your visit, bring your medications with you in a brown paper bag.

## 2015-03-01 NOTE — Progress Notes (Signed)
Subjective:    Patient ID: Gregg Flores, male    DOB: [birth year], 70 y.o.   MRN: [id number]  Chief Complaint  Patient presents with  . Diabetes    HPI Presents today for f/u on HTN, HLD, and DM2 with chronic kidney disease.   Is doing well, without problems or concerns.   He says everything is about the same. Says sometimes he is tired when he wakes up.   Working on eating healthy. Mainly vegetables, and meat consisting of pork, eggs, . Says sometimes he gets hungry and will eat whatever he has infront of him.   Exercise he gets through his normal ADL's, such as walking to the store (2 miles), cutting the grass etc.   Saw eye doctor on 01/26/15. Said that he saw a little glaucoma.   Says he bases his blood sugar on hunger. Only monitors on occasion. When he does check it runs around 125.  Wears dentures, hasn't been to dentist in 1 year.   Checks feet whenever he is in the showers. Checks between the toes. Has a podiatrist he is seeing for toenails.   No complaints with medications. Not taking his Pravastatin, thought he was told to stop taking it.   Planned last time: He has stopped the fenofibrate, and if today's labs indicate he needs lipid lowering, plan to increase the pravastatin from 40 mg to 80 mg.  Smoking cessation. Has spent 200-300 on nicotine patches, for the last 9 months. He says he can feel it wearing off, smokes about a pack a day.   Review of Systems  Constitutional: Negative for appetite change, fatigue and unexpected weight change.  HENT: Negative for dental problem, ear pain, sore throat, tinnitus and trouble swallowing.   Eyes: Negative for pain, redness and visual disturbance.  Respiratory: Negative for chest tightness and shortness of breath.   Cardiovascular: Negative for chest pain, palpitations and leg swelling.  Gastrointestinal: Negative for nausea, vomiting and abdominal pain.  Endocrine: Negative for polydipsia, polyphagia and polyuria.    Genitourinary: Negative for urgency, frequency and difficulty urinating.  Skin: Negative for color change and rash.  Neurological: Negative for dizziness, syncope and light-headedness.  Hematological: Does not bruise/bleed easily.       Objective:   Physical Exam  Constitutional: He appears well-developed and well-nourished. No distress.  BP 128/84 mmHg  Pulse 67  Temp(Src) 98.2 F (36.8 C) (Oral)  Resp 16  Ht 5' 9.5" (1.765 m)  Wt 240 lb 6.4 oz (109.045 kg)  BMI 35.00 kg/m2  SpO2 97%   HENT:  Head: Normocephalic and atraumatic.  Right Ear: External ear normal.  Left Ear: External ear normal.  Nose: Nose normal.  Mouth/Throat: Oropharynx is clear and moist and mucous membranes are normal. Abnormal dentition: edentulous. No oropharyngeal exudate.  Eyes: EOM are normal. Pupils are equal, round, and reactive to light. Right eye exhibits no discharge. Left eye exhibits no discharge. Right conjunctiva is injected. Left conjunctiva is injected. Scleral icterus: slight.  Fundoscopic exam:      The right eye shows no AV nicking, no hemorrhage and no papilledema. The right eye shows red reflex.       The left eye shows no AV nicking, no hemorrhage and no papilledema. The left eye shows red reflex.  Neck: Normal range of motion. Neck supple. No JVD present.  Cardiovascular: Normal rate, regular rhythm, S1 normal, S2 normal, normal heart sounds and normal pulses.  Exam reveals no gallop and no friction  rub.   No murmur heard. Pulses:      Radial pulses are 2+ on the right side, and 2+ on the left side.       Popliteal pulses are 2+ on the right side, and 2+ on the left side.       Posterior tibial pulses are 2+ on the right side, and 2+ on the left side.  No edema   Pulmonary/Chest: Effort normal and breath sounds normal. He exhibits no tenderness.  Lymphadenopathy:       Head (right side): No submental, no submandibular, no tonsillar, no preauricular, no posterior auricular and no  occipital adenopathy present.       Head (left side): No submental, no submandibular, no tonsillar, no preauricular, no posterior auricular and no occipital adenopathy present.    He has no cervical adenopathy.  Neurological: He is alert. He has normal reflexes.  Reflex Scores:      Tricep reflexes are 2+ on the right side and 2+ on the left side.      Bicep reflexes are 2+ on the right side and 2+ on the left side.      Brachioradialis reflexes are 2+ on the right side and 2+ on the left side.      Patellar reflexes are 2+ on the right side and 2+ on the left side.      Achilles reflexes are 2+ on the right side and 2+ on the left side. Vitals reviewed.      Assessment & Plan:  1. Type 2 diabetes mellitus with stage 3 chronic kidney disease, without long-term current use of insulin ([facility]) - [facility] Diabetes Foot Exam - Comprehensive metabolic panel - Hemoglobin A1c  2. Hyperlipidemia LDL goal <70 - pravastatin (PRAVACHOL) 80 MG tablet; Take 1 tablet (80 mg total) by mouth daily.  Dispense: 90 tablet; Refill: 3 - Comprehensive metabolic panel - Lipid panel  3. Essential hypertension, benign - Comprehensive metabolic panel  4. Tobacco use Spoke with patient about smoking cessation and cutting back.   Patient instructions given.   Follow-up in 3 months.

## 2015-03-02 ENCOUNTER — Encounter: Payer: Self-pay | Admitting: Physician Assistant

## 2015-04-27 ENCOUNTER — Encounter: Payer: Self-pay | Admitting: Podiatry

## 2015-04-27 ENCOUNTER — Ambulatory Visit (INDEPENDENT_AMBULATORY_CARE_PROVIDER_SITE_OTHER): Payer: Medicare Other | Admitting: Podiatry

## 2015-04-27 DIAGNOSIS — B351 Tinea unguium: Secondary | ICD-10-CM | POA: Diagnosis not present

## 2015-04-27 DIAGNOSIS — M79676 Pain in unspecified toe(s): Secondary | ICD-10-CM

## 2015-04-27 DIAGNOSIS — E119 Type 2 diabetes mellitus without complications: Secondary | ICD-10-CM

## 2015-04-27 NOTE — Progress Notes (Signed)
Patient ID: Gregg Flores, male   DOB: 05/28/1945, 70 y.o.   MRN: [id number]   Subjective: This patient presents for scheduled visit complaining of elongated and thickened toenails which are uncomfortable when he wears shoes and walks and is requesting toenail debridement  Objective: Orientated 3 No open skin lesions bilaterally The toenails are hypertrophic, elongated, discolored, incurvated and tender to direct palpation 6-10  Assessment: Symptomatic onychomycoses 6-10 Diabetic without complications  Plan: Debridement toenails 6-10 mechanically and electronically without any bleeding  Reappoint 3 months

## 2015-04-27 NOTE — Patient Instructions (Signed)
Diabetes and Foot Care Diabetes may cause you to have problems because of poor blood supply (circulation) to your feet and legs. This may cause the skin on your feet to become thinner, break easier, and heal more slowly. Your skin may become dry, and the skin may peel and crack. You may also have nerve damage in your legs and feet causing decreased feeling in them. You may not notice minor injuries to your feet that could lead to infections or more serious problems. Taking care of your feet is one of the most important things you can do for yourself.  HOME CARE INSTRUCTIONS  Wear shoes at all times, even in the house. Do not go barefoot. Bare feet are easily injured.  Check your feet daily for blisters, cuts, and redness. If you cannot see the bottom of your feet, use a mirror or ask someone for help.  Wash your feet with warm water (do not use hot water) and mild soap. Then pat your feet and the areas between your toes until they are completely dry. Do not soak your feet as this can dry your skin.  Apply a moisturizing lotion or petroleum jelly (that does not contain alcohol and is unscented) to the skin on your feet and to dry, brittle toenails. Do not apply lotion between your toes.  Trim your toenails straight across. Do not dig under them or around the cuticle. File the edges of your nails with an emery board or nail file.  Do not cut corns or calluses or try to remove them with medicine.  Wear clean socks or stockings every day. Make sure they are not too tight. Do not wear knee-high stockings since they may decrease blood flow to your legs.  Wear shoes that fit properly and have enough cushioning. To break in new shoes, wear them for just a few hours a day. This prevents you from injuring your feet. Always look in your shoes before you put them on to be sure there are no objects inside.  Do not cross your legs. This may decrease the blood flow to your feet.  If you find a minor scrape,  cut, or break in the skin on your feet, keep it and the skin around it clean and dry. These areas may be cleansed with mild soap and water. Do not cleanse the area with peroxide, alcohol, or iodine.  When you remove an adhesive bandage, be sure not to damage the skin around it.  If you have a wound, look at it several times a day to make sure it is healing.  Do not use heating pads or hot water bottles. They may burn your skin. If you have lost feeling in your feet or legs, you may not know it is happening until it is too late.  Make sure your health care provider performs a complete foot exam at least annually or more often if you have foot problems. Report any cuts, sores, or bruises to your health care provider immediately. SEEK MEDICAL CARE IF:   You have an injury that is not healing.  You have cuts or breaks in the skin.  You have an ingrown nail.  You notice redness on your legs or feet.  You feel burning or tingling in your legs or feet.  You have pain or cramps in your legs and feet.  Your legs or feet are numb.  Your feet always feel cold. SEEK IMMEDIATE MEDICAL CARE IF:   There is increasing redness,   swelling, or pain in or around a wound.  There is a red line that goes up your leg.  Pus is coming from a wound.  You develop a fever or as directed by your health care provider.  You notice a bad smell coming from an ulcer or wound.   This information is not intended to replace advice given to you by your health care provider. Make sure you discuss any questions you have with your health care provider.   Document Released: 01/13/2000 Document Revised: 09/17/2012 Document Reviewed: 06/24/2012 Elsevier Interactive Patient Education 2016 [employer]  

## 2015-05-16 DIAGNOSIS — N401 Enlarged prostate with lower urinary tract symptoms: Secondary | ICD-10-CM | POA: Diagnosis not present

## 2015-05-16 DIAGNOSIS — Z0000 Encounter for general adult medical examination without abnormal findings: Secondary | ICD-10-CM | POA: Diagnosis not present

## 2015-05-16 DIAGNOSIS — N138 Other obstructive and reflux uropathy: Secondary | ICD-10-CM | POA: Diagnosis not present

## 2015-05-16 DIAGNOSIS — R351 Nocturia: Secondary | ICD-10-CM | POA: Diagnosis not present

## 2015-05-26 ENCOUNTER — Encounter: Payer: Self-pay | Admitting: Physician Assistant

## 2015-05-26 DIAGNOSIS — N138 Other obstructive and reflux uropathy: Secondary | ICD-10-CM

## 2015-05-26 DIAGNOSIS — N401 Enlarged prostate with lower urinary tract symptoms: Principal | ICD-10-CM

## 2015-06-14 ENCOUNTER — Encounter: Payer: Self-pay | Admitting: Physician Assistant

## 2015-06-14 ENCOUNTER — Ambulatory Visit (INDEPENDENT_AMBULATORY_CARE_PROVIDER_SITE_OTHER): Payer: Medicare Other | Admitting: Family Medicine

## 2015-06-14 VITALS — BP 126/89 | HR 92 | Temp 97.6°F | Resp 16 | Ht 70.0 in | Wt 243.0 lb

## 2015-06-14 DIAGNOSIS — I4891 Unspecified atrial fibrillation: Secondary | ICD-10-CM | POA: Diagnosis not present

## 2015-06-14 DIAGNOSIS — I10 Essential (primary) hypertension: Secondary | ICD-10-CM | POA: Diagnosis not present

## 2015-06-14 DIAGNOSIS — M545 Low back pain, unspecified: Secondary | ICD-10-CM

## 2015-06-14 DIAGNOSIS — E1122 Type 2 diabetes mellitus with diabetic chronic kidney disease: Secondary | ICD-10-CM | POA: Diagnosis not present

## 2015-06-14 DIAGNOSIS — G8929 Other chronic pain: Secondary | ICD-10-CM | POA: Diagnosis not present

## 2015-06-14 LAB — COMPREHENSIVE METABOLIC PANEL
ALT: 13 U/L (ref 9–46)
AST: 16 U/L (ref 10–35)
Albumin: 4.1 g/dL (ref 3.6–5.1)
Alkaline Phosphatase: 49 U/L (ref 40–115)
BILIRUBIN TOTAL: 0.3 mg/dL (ref 0.2–1.2)
BUN: 16 mg/dL (ref 7–25)
CHLORIDE: 105 mmol/L (ref 98–110)
CO2: 21 mmol/L (ref 20–31)
Calcium: 8.8 mg/dL (ref 8.6–10.3)
Creat: 0.8 mg/dL (ref 0.70–1.18)
Glucose, Bld: 110 mg/dL — ABNORMAL HIGH (ref 65–99)
POTASSIUM: 4.1 mmol/L (ref 3.5–5.3)
SODIUM: 138 mmol/L (ref 135–146)
TOTAL PROTEIN: 7.1 g/dL (ref 6.1–8.1)

## 2015-06-14 LAB — HEMOGLOBIN A1C
Hgb A1c MFr Bld: 7.4 % — ABNORMAL HIGH (ref <5.7)
MEAN PLASMA GLUCOSE: 166 mg/dL

## 2015-06-14 MED ORDER — METOPROLOL TARTRATE 50 MG PO TABS
50.0000 mg | ORAL_TABLET | Freq: Two times a day (BID) | ORAL | Status: DC
Start: 2015-06-14 — End: 2016-01-03

## 2015-06-14 MED ORDER — ASPIRIN EC 325 MG PO TBEC: 325.0000 mg | DELAYED_RELEASE_TABLET | Freq: Every day | ORAL | Status: DC

## 2015-06-14 MED ORDER — HYDROCODONE-ACETAMINOPHEN 7.5-325 MG PO TABS: 1.0000 | ORAL_TABLET | Freq: Four times a day (QID) | ORAL | Status: DC | PRN

## 2015-06-14 MED ORDER — METFORMIN HCL 1000 MG PO TABS: 1000.0000 mg | ORAL_TABLET | Freq: Two times a day (BID) | ORAL | Status: DC

## 2015-06-14 NOTE — Patient Instructions (Addendum)
Stop the baby aspirin. Take the regular strength aspirin instead. Start the metoprolol today. Take one tablet two times each day (morning and night). The cardiology office will call you to schedule an appointment there. It's even more important now that you quit smoking.    IF you received an x-ray today, you will receive an invoice from [facility]. Please contact [facility] at [phone] with questions or concerns regarding your invoice.   IF you received labwork today, you will receive an invoice from [employer]. Please contact [facility] at [phone] with questions or concerns regarding your invoice.   Our billing staff will not be able to assist you with questions regarding bills from these companies.  You will be contacted with the lab results as soon as they are available. The fastest way to get your results is to activate your My Chart account. Instructions are located on the last page of this paperwork. If you have not heard from [place] regarding the results in 2 weeks, please contact this office.

## 2015-06-14 NOTE — Progress Notes (Signed)
Patient ID: Gregg Flores, male    DOB: 1945-05-19, 70 y.o.   MRN: [id number]  PCP: Jamilia Jacques, PA-C  Subjective:   Chief Complaint  Patient presents with  . Diabetes  . Medication Refill    pt not sure which ones    HPI Presents for evaluation of diabetes, HTN and hyperlipidemia.  He needs refills of hydrocodone and metformin.  Feels well. No medication adverse effects.  Saw Dr. Risa Grill about 2 weeks ago. Saw eye doctor this year.  Review of Systems  Constitutional: Negative for activity change, appetite change, fatigue and unexpected weight change.  HENT: Negative for congestion, dental problem, ear pain, hearing loss, mouth sores, postnasal drip, rhinorrhea, sneezing, sore throat, tinnitus and trouble swallowing.   Eyes: Negative for photophobia, pain, redness and visual disturbance.  Respiratory: Negative for cough, chest tightness and shortness of breath.   Cardiovascular: Negative for chest pain, palpitations and leg swelling.  Gastrointestinal: Negative for nausea, vomiting, abdominal pain, diarrhea, constipation and blood in stool.  Genitourinary: Negative for dysuria, urgency, frequency and hematuria.  Musculoskeletal: Negative for myalgias, arthralgias, gait problem and neck stiffness.  Skin: Negative for rash.  Neurological: Negative for dizziness, speech difficulty, weakness, light-headedness, numbness and headaches.  Hematological: Negative for adenopathy.  Psychiatric/Behavioral: Negative for confusion and sleep disturbance. The patient is not nervous/anxious.        Patient Active Problem List   Diagnosis Date Noted  . BMI 35.0-35.9,adult 05/18/2014  . History of hepatitis C 06/15/2012  . Hyperlipidemia LDL goal <70 04/06/2012  . Tobacco use 09/25/2011  . Essential hypertension, benign   . Chronic LBP   . Type 2 diabetes mellitus with renal manifestations ([place])   . Diverticulosis   . Benign prostatic hyperplasia with urinary obstruction   .  Monoclonal gammopathy   . Cataracts, bilateral   . Erectile dysfunction due to arterial insufficiency   . Microalbuminuria   . Benign neoplasm of colon 07/01/2008     Prior to Admission medications   Medication Sig Start Date End Date Taking? Authorizing Provider  amLODipine (NORVASC) 10 MG tablet TAKE 1 TABLET BY MOUTH ONCE DAILY. 02/08/15  Yes Anesia Blackwell, PA-C  benazepril (LOTENSIN) 40 MG tablet TAKE 1 TABLET BY MOUTH ONCE DAILY. 02/08/15  Yes Jonia Oakey, PA-C  hydrochlorothiazide (HYDRODIURIL) 25 MG tablet TAKE 1 TABLET BY MOUTH EVERY MORNING 02/08/15  Yes Joaopedro Eschbach, PA-C  HYDROcodone-acetaminophen (NORCO) 7.5-325 MG tablet Take 1 tablet by mouth every 6 (six) hours as needed. 06/14/15  Yes Ezra Marquess, PA-C  ipratropium (ATROVENT) 0.03 % nasal spray Place 2 sprays into the nose every 12 (twelve) hours. 05/18/14  Yes Anabel Lykins, PA-C  meloxicam (MOBIC) 15 MG tablet TAKE 1 TABLET BY MOUTH ONCE DAILY AS NEEDED FOR PAIN. DO  NOT USE WITH ALEVE 02/08/15  Yes Lanna Labella, PA-C  metFORMIN (GLUCOPHAGE) 1000 MG tablet Take 1 tablet (1,000 mg total) by mouth 2 (two) times daily with a meal. 06/14/15  Yes Mallory Schaad, PA-C  pravastatin (PRAVACHOL) 80 MG tablet Take 1 tablet (80 mg total) by mouth daily. 03/01/15  Yes Wayland Baik, PA-C  RA ASPIRIN ADULT LOW STRENGTH 81 MG EC tablet take 1 tablet by mouth once daily 09/30/12  Yes Kysa Calais, PA-C     No Known Allergies     Objective:  Physical Exam  Constitutional: He is oriented to person, place, and time. He appears well-developed and well-nourished. He is active and cooperative. No distress.  BP 126/89 mmHg  Pulse  92  Temp(Src) 97.6 F (36.4 C)  Resp 16  Ht 5\' 10"  (1.778 m)  Wt 243 lb (110.224 kg)  BMI 34.87 kg/m2  HENT:  Head: Normocephalic and atraumatic.  Right Ear: Hearing normal.  Left Ear: Hearing normal.  Eyes: Conjunctivae are normal. No scleral icterus.  Neck: Normal range of motion. Neck supple. No  thyromegaly present.  Cardiovascular: Normal rate, normal heart sounds and intact distal pulses.  An irregularly irregular rhythm present.  No murmur heard. Pulses:      Radial pulses are 2+ on the right side, and 2+ on the left side.  Pulmonary/Chest: Effort normal and breath sounds normal.  Lymphadenopathy:       Head (right side): No tonsillar, no preauricular, no posterior auricular and no occipital adenopathy present.       Head (left side): No tonsillar, no preauricular, no posterior auricular and no occipital adenopathy present.    He has no cervical adenopathy.       Right: No supraclavicular adenopathy present.       Left: No supraclavicular adenopathy present.  Neurological: He is alert and oriented to person, place, and time. No sensory deficit.  Skin: Skin is warm, dry and intact. No rash noted. No cyanosis or erythema. Nails show no clubbing.  Psychiatric: He has a normal mood and affect. His speech is normal and behavior is normal.    EKG reviewed with Dr. Tamala Julian. Irregularly irregular, rate of 112. Consistent with Afib.       Assessment & Plan:   1. Chronic LBP Continue prn use of Norco. He took the last dose last night. - HYDROcodone-acetaminophen (NORCO) 7.5-325 MG tablet; Take 1 tablet by mouth every 6 (six) hours as needed.  Dispense: 30 tablet; Refill: 0  2. Essential hypertension, benign Controlled. - Comprehensive metabolic panel  3. Type 2 diabetes mellitus with chronic kidney disease, without long-term current use of insulin, unspecified CKD stage ([place]) Await A1C. Has been controlled. - metFORMIN (GLUCOPHAGE) 1000 MG tablet; Take 1 tablet (1,000 mg total) by mouth 2 (two) times daily with a meal.  Dispense: 180 tablet; Refill: 3 - Hemoglobin A1c  4. Atrial fibrillation, unspecified type ([place]) New diagnosis. Increase ASA to 325 mg and start lopressor. Refer to Cardiology for additional evaluation/treatment. Again stressed the importance of smoking  cessation. - EKG 12-Lead - aspirin EC 325 MG tablet; Take 1 tablet (325 mg total) by mouth daily.  Dispense: 30 tablet; Refill: 0 - metoprolol (LOPRESSOR) 50 MG tablet; Take 1 tablet (50 mg total) by mouth 2 (two) times daily.  Dispense: 180 tablet; Refill: 3 - Ambulatory referral to Cardiology   Return in about 3 months (around 09/14/2015).   Fara Chute, PA-C Physician Assistant-Certified [facility]

## 2015-06-16 ENCOUNTER — Encounter: Payer: Self-pay | Admitting: Physician Assistant

## 2015-06-21 ENCOUNTER — Encounter: Payer: Self-pay | Admitting: Gastroenterology

## 2015-07-18 ENCOUNTER — Encounter: Payer: Self-pay | Admitting: Cardiovascular Disease

## 2015-07-18 ENCOUNTER — Ambulatory Visit (INDEPENDENT_AMBULATORY_CARE_PROVIDER_SITE_OTHER): Payer: Medicare Other | Admitting: Cardiovascular Disease

## 2015-07-18 VITALS — BP 122/86 | HR 57 | Ht 69.0 in | Wt 250.8 lb

## 2015-07-18 DIAGNOSIS — Z7189 Other specified counseling: Secondary | ICD-10-CM | POA: Diagnosis not present

## 2015-07-18 DIAGNOSIS — I480 Paroxysmal atrial fibrillation: Secondary | ICD-10-CM | POA: Diagnosis not present

## 2015-07-18 DIAGNOSIS — Z7689 Persons encountering health services in other specified circumstances: Secondary | ICD-10-CM

## 2015-07-18 MED ORDER — RIVAROXABAN 20 MG PO TABS: 20.0000 mg | ORAL_TABLET | Freq: Every day | ORAL | Status: DC

## 2015-07-18 NOTE — Patient Instructions (Addendum)
Medication Instructions:  START Xarelto 20 mg once daily   Labwork: None Ordered   Testing/Procedures: Your physician has requested that you have an echocardiogram. Echocardiography is a painless test that uses sound waves to create images of your heart. It provides your doctor with information about the size and shape of your heart and how well your heart's chambers and valves are working. This procedure takes approximately one hour. There are no restrictions for this procedure.   Follow-Up: Your physician recommends that you schedule a follow-up appointment in: Pharmacy for New Anticoagulation in 2-3 weeks   Your physician wants you to follow-up in: 3 months with Dr. Johnsie Cancel.  You will receive a reminder letter in the mail two months in advance. If you don't receive a letter, please call our office to schedule the follow-up appointment.   If you need a refill on your cardiac medications before your next appointment, please call your pharmacy.   Thank you for choosing [facility]! Christen Bame, RN [phone]

## 2015-07-18 NOTE — Progress Notes (Signed)
Cardiology Office Note   Date:  07/18/2015   ID:  Gregg Flores, DOB March 05, 1945, MRN [id number]  PCP:  JEFFERY,CHELLE, PA-C  Cardiologist:   Jenkins Rouge, MD   Chief Complaint  Patient presents with  . Establish Care    some SOB, per pt      History of Present Illness: Gregg Flores is a 70 y.o. male who presents for evaluation of afib. Seen by family practice 5/16 and noted to be in afib.  Started on lopressor but no anticoagulation. CRF;s HTN, DM and elevated lipids.  He is unaware of rhythm No palpitations, dyspnea or chest pain No TIA Renal function normal on recent labs Denies ETOH.  Divorced has one son Who lives near him. He is retired Secretary/administrator time to explain things to him     Past Medical History  Diagnosis Date  . Essential hypertension, benign 1990  . Chronic LBP   . Type II or unspecified type diabetes mellitus without mention of complication, not stated as uncontrolled   . Diverticulosis   . Colon polyps   . BPH (benign prostatic hyperplasia)   . Monoclonal gammopathy 04/2008  . Cataracts, bilateral   . ED (erectile dysfunction)   . Microalbuminuria     Past Surgical History  Procedure Laterality Date  . Transurethral resection of prostate       Current Outpatient Prescriptions  Medication Sig Dispense Refill  . aspirin EC 325 MG tablet Take 1 tablet (325 mg total) by mouth daily. 30 tablet 0  . benazepril (LOTENSIN) 40 MG tablet TAKE 1 TABLET BY MOUTH ONCE DAILY. 90 tablet 3  . hydrochlorothiazide (HYDRODIURIL) 25 MG tablet TAKE 1 TABLET BY MOUTH EVERY MORNING 90 tablet 3  . HYDROcodone-acetaminophen (NORCO) 7.5-325 MG tablet Take 1-2 tablets by mouth daily as needed. For pain  0  . ipratropium (ATROVENT) 0.03 % nasal spray Place 2 sprays into the nose every 12 (twelve) hours. 90 mL 3  . meloxicam (MOBIC) 15 MG tablet TAKE 1 TABLET BY MOUTH ONCE DAILY AS NEEDED FOR PAIN. DO  NOT USE WITH ALEVE 90 tablet 3  . metFORMIN (GLUCOPHAGE) 1000 MG tablet  Take 1 tablet (1,000 mg total) by mouth 2 (two) times daily with a meal. 180 tablet 3  . metoprolol (LOPRESSOR) 50 MG tablet Take 1 tablet (50 mg total) by mouth 2 (two) times daily. 180 tablet 3  . pravastatin (PRAVACHOL) 80 MG tablet Take 1 tablet (80 mg total) by mouth daily. 90 tablet 3  . rivaroxaban (XARELTO) 20 MG TABS tablet Take 1 tablet (20 mg total) by mouth daily with supper. 30 tablet 11   No current facility-administered medications for this visit.    Allergies:   Review of patient's allergies indicates no known allergies.    Social History:  The patient  reports that he has been smoking Cigarettes.  He has a 43 pack-year smoking history. He has never used smokeless tobacco. He reports that he does not drink alcohol or use illicit drugs.   Family History:  The patient's family history includes Diabetes in his brother; Heart disease in his mother; Hypertension in his mother. There is no history of Colon cancer.    ROS:  Please see the history of present illness.   Otherwise, review of systems are positive for none.   All other systems are reviewed and negative.    PHYSICAL EXAM: VS:  BP 122/86 mmHg  Pulse 57  Ht 5\' 9"  (1.753 m)  Wt 250 lb 12.8 oz (113.762 kg)  BMI 37.02 kg/m2  SpO2 95% , BMI Body mass index is 37.02 kg/(m^2). Affect appropriate Overweight black male  HEENT: normal Neck supple with no adenopathy JVP normal no bruits no thyromegaly Lungs clear with no wheezing and good diaphragmatic motion Heart:  S1/S2 no murmur, no rub, gallop or click PMI normal Abdomen: benighn, BS positve, no tenderness, no AAA no bruit.  No HSM or HJR Distal pulses intact with no bruits No edema Neuro non-focal Skin warm and dry No muscular weakness    EKG:  06/14/15  afib rate 112 ? Old IMI  Low voltage  07/18/15 afib rate 99 low voltage axis shift no Q;s inferiorly now    Recent Labs: 11/12/2014: HGB 13.7; Platelets 189 06/14/2015: ALT 13; BUN 16; Creat 0.80; Potassium  4.1; Sodium 138    Lipid Panel    Component Value Date/Time   CHOL 145 03/01/2015 0921   TRIG 136 03/01/2015 0921   HDL 27* 03/01/2015 0921   CHOLHDL 5.4* 03/01/2015 0921   VLDL 27 03/01/2015 0921   LDLCALC 91 03/01/2015 0921      Wt Readings from Last 3 Encounters:  07/18/15 250 lb 12.8 oz (113.762 kg)  06/14/15 243 lb (110.224 kg)  03/01/15 240 lb 6.4 oz (109.045 kg)      Other studies Reviewed: Additional studies/ records that were reviewed today include: Primary care notes, labs and ECG .    ASSESSMENT AND PLAN:  1. Afib start xarelto Cr normal continue lopressor may need higher dose F/U echo and will then discuss [facility] next visit See pharmacist to make Sure NOAC ok and affordable samples and care path card given 2. HTN: Well controlled.  Continue current medications and low sodium Dash type diet.   3: Chol:   Cholesterol is at goal.  Continue current dose of statin and diet Rx.  No myalgias or side effects.  F/U  LFT's in 6 months. Lab Results  Component Value Date   LDLCALC 91 03/01/2015            4: DM  Discussed low carb diet.  Target hemoglobin A1c is 6.5 or less.  Continue current medications.   Current medicines are reviewed at length with the patient today.  The patient does not have concerns regarding medicines.  The following changes have been made:  Xarelto Stop ASA   Labs/ tests ordered today include: Echo   Orders Placed This Encounter  Procedures  . EKG 12-Lead  . ECHO COMPLETE     Disposition:   FU with me next available      Signed, Jenkins Rouge, MD  07/18/2015 12:07 PM    [address] [employer], [place], [place]  [address] Phone: [phone]; Fax: [phone]

## 2015-07-26 NOTE — Progress Notes (Signed)
History and physical examinations reviewed in detail with PA Jacqulynn Cadet.  EKG reviewed during visit.  Agree with assessment and plan. Kelvyn Schunk Elayne Guerin, M.D. [facility] [address] [place], [place]  [address] [phone] phone [phone] fax

## 2015-08-03 ENCOUNTER — Encounter: Payer: Self-pay | Admitting: Podiatry

## 2015-08-03 ENCOUNTER — Ambulatory Visit (INDEPENDENT_AMBULATORY_CARE_PROVIDER_SITE_OTHER): Payer: Medicare Other | Admitting: Podiatry

## 2015-08-03 DIAGNOSIS — M79676 Pain in unspecified toe(s): Secondary | ICD-10-CM | POA: Diagnosis not present

## 2015-08-03 DIAGNOSIS — B351 Tinea unguium: Secondary | ICD-10-CM

## 2015-08-03 DIAGNOSIS — E119 Type 2 diabetes mellitus without complications: Secondary | ICD-10-CM | POA: Diagnosis not present

## 2015-08-03 NOTE — Patient Instructions (Signed)
Diabetes and Foot Care Diabetes may cause you to have problems because of poor blood supply (circulation) to your feet and legs. This may cause the skin on your feet to become thinner, break easier, and heal more slowly. Your skin may become dry, and the skin may peel and crack. You may also have nerve damage in your legs and feet causing decreased feeling in them. You may not notice minor injuries to your feet that could lead to infections or more serious problems. Taking care of your feet is one of the most important things you can do for yourself.  HOME CARE INSTRUCTIONS  Wear shoes at all times, even in the house. Do not go barefoot. Bare feet are easily injured.  Check your feet daily for blisters, cuts, and redness. If you cannot see the bottom of your feet, use a mirror or ask someone for help.  Wash your feet with warm water (do not use hot water) and mild soap. Then pat your feet and the areas between your toes until they are completely dry. Do not soak your feet as this can dry your skin.  Apply a moisturizing lotion or petroleum jelly (that does not contain alcohol and is unscented) to the skin on your feet and to dry, brittle toenails. Do not apply lotion between your toes.  Trim your toenails straight across. Do not dig under them or around the cuticle. File the edges of your nails with an emery board or nail file.  Do not cut corns or calluses or try to remove them with medicine.  Wear clean socks or stockings every day. Make sure they are not too tight. Do not wear knee-high stockings since they may decrease blood flow to your legs.  Wear shoes that fit properly and have enough cushioning. To break in new shoes, wear them for just a few hours a day. This prevents you from injuring your feet. Always look in your shoes before you put them on to be sure there are no objects inside.  Do not cross your legs. This may decrease the blood flow to your feet.  If you find a minor scrape,  cut, or break in the skin on your feet, keep it and the skin around it clean and dry. These areas may be cleansed with mild soap and water. Do not cleanse the area with peroxide, alcohol, or iodine.  When you remove an adhesive bandage, be sure not to damage the skin around it.  If you have a wound, look at it several times a day to make sure it is healing.  Do not use heating pads or hot water bottles. They may burn your skin. If you have lost feeling in your feet or legs, you may not know it is happening until it is too late.  Make sure your health care provider performs a complete foot exam at least annually or more often if you have foot problems. Report any cuts, sores, or bruises to your health care provider immediately. SEEK MEDICAL CARE IF:   You have an injury that is not healing.  You have cuts or breaks in the skin.  You have an ingrown nail.  You notice redness on your legs or feet.  You feel burning or tingling in your legs or feet.  You have pain or cramps in your legs and feet.  Your legs or feet are numb.  Your feet always feel cold. SEEK IMMEDIATE MEDICAL CARE IF:   There is increasing redness,   swelling, or pain in or around a wound.  There is a red line that goes up your leg.  Pus is coming from a wound.  You develop a fever or as directed by your health care provider.  You notice a bad smell coming from an ulcer or wound.   This information is not intended to replace advice given to you by your health care provider. Make sure you discuss any questions you have with your health care provider.   Document Released: 01/13/2000 Document Revised: 09/17/2012 Document Reviewed: 06/24/2012 Elsevier Interactive Patient Education 2016 [employer]  

## 2015-08-04 NOTE — Progress Notes (Signed)
Patient ID: Gregg Flores, male   DOB: 16-Aug-1945, 70 y.o.   MRN: [id number]  Subjective: This patient presents for scheduled visit complaining of elongated and thickened toenails which are uncomfortable when he wears shoes and walks and is requesting toenail debridement  Objective: Orientated 3 No open skin lesions bilaterally The toenails are hypertrophic, elongated, discolored, incurvated and tender to direct palpation 6-10  Assessment: Symptomatic onychomycoses 6-10 Diabetic without complications  Plan: Debridement toenails 6-10 mechanically and electronically without any bleeding  Reappoint 3 months

## 2015-08-09 ENCOUNTER — Ambulatory Visit (INDEPENDENT_AMBULATORY_CARE_PROVIDER_SITE_OTHER): Payer: Medicare Other | Admitting: Pharmacist

## 2015-08-09 ENCOUNTER — Other Ambulatory Visit: Payer: Self-pay

## 2015-08-09 ENCOUNTER — Ambulatory Visit (HOSPITAL_COMMUNITY): Payer: Medicare Other | Attending: Cardiology

## 2015-08-09 VITALS — Wt 248.5 lb

## 2015-08-09 DIAGNOSIS — I480 Paroxysmal atrial fibrillation: Secondary | ICD-10-CM | POA: Diagnosis not present

## 2015-08-09 DIAGNOSIS — Z720 Tobacco use: Secondary | ICD-10-CM | POA: Diagnosis not present

## 2015-08-09 DIAGNOSIS — I77810 Thoracic aortic ectasia: Secondary | ICD-10-CM | POA: Diagnosis not present

## 2015-08-09 DIAGNOSIS — I358 Other nonrheumatic aortic valve disorders: Secondary | ICD-10-CM | POA: Diagnosis not present

## 2015-08-09 DIAGNOSIS — I119 Hypertensive heart disease without heart failure: Secondary | ICD-10-CM | POA: Diagnosis not present

## 2015-08-09 DIAGNOSIS — I059 Rheumatic mitral valve disease, unspecified: Secondary | ICD-10-CM | POA: Insufficient documentation

## 2015-08-09 DIAGNOSIS — E119 Type 2 diabetes mellitus without complications: Secondary | ICD-10-CM | POA: Insufficient documentation

## 2015-08-09 DIAGNOSIS — I4891 Unspecified atrial fibrillation: Secondary | ICD-10-CM | POA: Insufficient documentation

## 2015-08-09 DIAGNOSIS — Z8249 Family history of ischemic heart disease and other diseases of the circulatory system: Secondary | ICD-10-CM | POA: Diagnosis not present

## 2015-08-09 DIAGNOSIS — E785 Hyperlipidemia, unspecified: Secondary | ICD-10-CM | POA: Diagnosis not present

## 2015-08-09 LAB — BASIC METABOLIC PANEL
BUN: 13 mg/dL (ref 7–25)
CHLORIDE: 106 mmol/L (ref 98–110)
CO2: 26 mmol/L (ref 20–31)
CREATININE: 1 mg/dL (ref 0.70–1.18)
Calcium: 9.7 mg/dL (ref 8.6–10.3)
Glucose, Bld: 171 mg/dL — ABNORMAL HIGH (ref 65–99)
Potassium: 4.5 mmol/L (ref 3.5–5.3)
Sodium: 140 mmol/L (ref 135–146)

## 2015-08-09 LAB — CBC
HEMATOCRIT: 45.1 % (ref 38.5–50.0)
Hemoglobin: 15.1 g/dL (ref 13.2–17.1)
MCH: 27.8 pg (ref 27.0–33.0)
MCHC: 33.5 g/dL (ref 32.0–36.0)
MCV: 83.1 fL (ref 80.0–100.0)
MPV: 9.7 fL (ref 7.5–12.5)
Platelets: 243 10*3/uL (ref 140–400)
RBC: 5.43 MIL/uL (ref 4.20–5.80)
RDW: 13.8 % (ref 11.0–15.0)
WBC: 4.5 10*3/uL (ref 3.8–10.8)

## 2015-08-09 LAB — ECHOCARDIOGRAM COMPLETE: Weight: 3976 oz

## 2015-08-09 NOTE — Progress Notes (Signed)
Pt was started on Xarelto 20mg  daily by Dr. Johnsie Cancel for atrial fibrillation on 07/18/15.  Reviewed patients medication list.  Pt is not currently on any combined P-gp and strong CYP3A4 inhibitors/inducers (ketoconazole, traconazole, ritonavir, carbamazepine, phenytoin, rifampin, St. John's wort).  Reviewed labs.  SCr 1, Weight 248 lb 8 oz, CrCl > 136mL/min.  Dose appropriate based on CrCl.   Hgb and HCT wnl at 15.1 and 45.1, respectively.  A full discussion of the nature of anticoagulants has been carried out.  A benefit/risk analysis has been presented to the patient, so that they understand the justification for choosing anticoagulation with Xarelto at this time.  The need for compliance is stressed.  Pt is aware to take the medication once daily with the largest meal of the day.  Side effects of potential bleeding are discussed, including unusual colored urine or stools, coughing up blood or coffee ground emesis, nose bleeds or serious fall or head trauma.  Discussed signs and symptoms of stroke. The patient should avoid any OTC items containing aspirin or ibuprofen.  Avoid alcohol consumption.   Call if any signs of abnormal bleeding.  Discussed financial obligations and resolved any difficulty in obtaining medication.  Next lab test test in 6 months.

## 2015-08-15 ENCOUNTER — Telehealth: Payer: Self-pay | Admitting: Cardiovascular Disease

## 2015-08-15 ENCOUNTER — Encounter: Payer: Self-pay | Admitting: Cardiovascular Disease

## 2015-08-15 NOTE — Telephone Encounter (Signed)
Mr. Shytle is returning a call about his test results . Please call   Thanks

## 2015-08-15 NOTE — Telephone Encounter (Signed)
Informed pt of lab and echo results. Pt verbalized understanding.

## 2015-09-22 ENCOUNTER — Telehealth: Payer: Self-pay

## 2015-09-22 NOTE — Telephone Encounter (Signed)
Prior auth obtained for Xarelto 20mg  through [employer]. PA- [id number], good through 01/29/2016.

## 2015-09-27 ENCOUNTER — Encounter: Payer: Self-pay | Admitting: Physician Assistant

## 2015-09-27 ENCOUNTER — Telehealth: Payer: Self-pay

## 2015-09-27 ENCOUNTER — Ambulatory Visit (INDEPENDENT_AMBULATORY_CARE_PROVIDER_SITE_OTHER): Payer: Medicare Other | Admitting: Physician Assistant

## 2015-09-27 VITALS — BP 126/88 | HR 103 | Temp 97.7°F | Resp 18 | Ht 69.0 in | Wt 245.0 lb

## 2015-09-27 DIAGNOSIS — G8929 Other chronic pain: Secondary | ICD-10-CM

## 2015-09-27 DIAGNOSIS — N183 Chronic kidney disease, stage 3 (moderate): Secondary | ICD-10-CM

## 2015-09-27 DIAGNOSIS — R809 Proteinuria, unspecified: Secondary | ICD-10-CM | POA: Diagnosis not present

## 2015-09-27 DIAGNOSIS — I10 Essential (primary) hypertension: Secondary | ICD-10-CM | POA: Diagnosis not present

## 2015-09-27 DIAGNOSIS — E785 Hyperlipidemia, unspecified: Secondary | ICD-10-CM

## 2015-09-27 DIAGNOSIS — E1122 Type 2 diabetes mellitus with diabetic chronic kidney disease: Secondary | ICD-10-CM | POA: Diagnosis not present

## 2015-09-27 DIAGNOSIS — M545 Low back pain: Secondary | ICD-10-CM

## 2015-09-27 LAB — LIPID PANEL
Cholesterol: 138 mg/dL (ref 125–200)
HDL: 27 mg/dL — AB (ref >=40)
LDL Cholesterol: 89 mg/dL (ref <130)
TRIGLYCERIDES: 108 mg/dL (ref <150)
Total CHOL/HDL Ratio: 5.1 Ratio — ABNORMAL HIGH (ref <=5.0)
VLDL: 22 mg/dL (ref <30)

## 2015-09-27 LAB — HEMOGLOBIN A1C
HEMOGLOBIN A1C: 7.2 % — AB (ref <5.7)
MEAN PLASMA GLUCOSE: 160 mg/dL

## 2015-09-27 MED ORDER — HYDROCODONE-ACETAMINOPHEN 7.5-325 MG PO TABS: 1.0000 | ORAL_TABLET | Freq: Every day | ORAL | 0 refills | Status: DC | PRN

## 2015-09-27 NOTE — Progress Notes (Signed)
Patient ID: Gregg Flores, male    DOB: 04-23-1945, 70 y.o.   MRN: [id number]  PCP: Harrison Mons, PA-C  Subjective:   Chief Complaint  Patient presents with  . Follow-up    3 month    HPI Presents for evaluation of diabetes, HTN, hyperlipidemia.  He's doing well, no new issues/concerns. Rarely checks home glucose. It's not clear that he is taking amlodipine at this point. It's been removed from his medication list, but appears in medication reconciliation as filled on 08/09/15. I believe that he likely IS taking it, but he did not bring medications with him today.  Continues on Xarelto. No problems. No melena. No bleeding gums. No hematuria. No longer taking ASA, but he thinks he is still taking meloxicam.    Review of Systems  Constitutional: Negative for activity change, appetite change, fatigue and unexpected weight change.  HENT: Negative for congestion, dental problem, ear pain, hearing loss, mouth sores, postnasal drip, rhinorrhea, sneezing, sore throat, tinnitus and trouble swallowing.   Eyes: Negative for photophobia, pain, redness and visual disturbance.  Respiratory: Negative for cough, chest tightness and shortness of breath.   Cardiovascular: Negative for chest pain, palpitations and leg swelling.  Gastrointestinal: Negative for abdominal pain, blood in stool, constipation, diarrhea, nausea and vomiting.  Endocrine: Negative for cold intolerance, heat intolerance, polydipsia, polyphagia and polyuria.  Genitourinary: Negative for dysuria, frequency, hematuria and urgency.  Musculoskeletal: Negative for arthralgias, gait problem, myalgias and neck stiffness.  Skin: Negative for rash.  Neurological: Negative for dizziness, speech difficulty, weakness, light-headedness, numbness and headaches.  Hematological: Negative for adenopathy.  Psychiatric/Behavioral: Negative for confusion and sleep disturbance. The patient is not nervous/anxious.        Patient Active  Problem List   Diagnosis Date Noted  . BMI 35.0-35.9,adult 05/18/2014  . History of hepatitis C 06/15/2012  . Hyperlipidemia LDL goal <70 04/06/2012  . Tobacco use 09/25/2011  . Essential hypertension, benign   . Chronic LBP   . Type 2 diabetes mellitus with renal manifestations ([place])   . Diverticulosis   . Benign prostatic hyperplasia with urinary obstruction   . Monoclonal gammopathy   . Cataracts, bilateral   . Erectile dysfunction due to arterial insufficiency   . Microalbuminuria   . Benign neoplasm of colon 07/01/2008     Prior to Admission medications   Medication Sig Start Date End Date Taking? Authorizing Provider  amLODipine (NORVASC) 10 MG tablet Take 10 mg by mouth daily. 08/09/15  Yes Historical Provider, MD  benazepril (LOTENSIN) 40 MG tablet TAKE 1 TABLET BY MOUTH ONCE DAILY. 02/08/15  Yes Erendida Wrenn, PA-C  hydrochlorothiazide (HYDRODIURIL) 25 MG tablet TAKE 1 TABLET BY MOUTH EVERY MORNING 02/08/15  Yes Nadirah Socorro, PA-C  HYDROcodone-acetaminophen (NORCO) 7.5-325 MG tablet Take 1-2 tablets by mouth daily as needed. For pain 06/14/15  Yes Historical Provider, MD  ipratropium (ATROVENT) 0.03 % nasal spray Place 2 sprays into the nose every 12 (twelve) hours. 05/18/14  Yes Dalaya Suppa, PA-C  meloxicam (MOBIC) 15 MG tablet TAKE 1 TABLET BY MOUTH ONCE DAILY AS NEEDED FOR PAIN. DO  NOT USE WITH ALEVE 02/08/15  Yes Vilma Will, PA-C  metFORMIN (GLUCOPHAGE) 1000 MG tablet Take 1 tablet (1,000 mg total) by mouth 2 (two) times daily with a meal. 06/14/15  Yes Taiwan Talcott, PA-C  metoprolol (LOPRESSOR) 50 MG tablet Take 1 tablet (50 mg total) by mouth 2 (two) times daily. 06/14/15  Yes Glenetta Kiger, PA-C  pravastatin (PRAVACHOL) 80 MG tablet  Take 1 tablet (80 mg total) by mouth daily. 03/01/15  Yes Pammy Vesey, PA-C  rivaroxaban (XARELTO) 20 MG TABS tablet Take 1 tablet (20 mg total) by mouth daily with supper. 07/18/15  Yes Josue Hector, MD     No Known  Allergies     Objective:  Physical Exam  Constitutional: He is oriented to person, place, and time. He appears well-developed and well-nourished. He is active and cooperative. No distress.  BP 126/88 (BP Location: Right Arm, Patient Position: Sitting, Cuff Size: Large)   Pulse (!) 103   Temp 97.7 F (36.5 C) (Oral)   Resp 18   Ht 5\' 9"  (1.753 m)   Wt 245 lb (111.1 kg)   SpO2 96%   BMI 36.18 kg/m   HENT:  Head: Normocephalic and atraumatic.  Right Ear: Hearing normal.  Left Ear: Hearing normal.  Eyes: Conjunctivae are normal. No scleral icterus.  Neck: Normal range of motion. Neck supple. No thyromegaly present.  Cardiovascular: Normal rate, regular rhythm and normal heart sounds.   Pulses:      Radial pulses are 2+ on the right side, and 2+ on the left side.  AFib not appreciated on today's exam. Trace pitting edema bilateral LE, R>L  Pulmonary/Chest: Effort normal and breath sounds normal.  Lymphadenopathy:       Head (right side): No tonsillar, no preauricular, no posterior auricular and no occipital adenopathy present.       Head (left side): No tonsillar, no preauricular, no posterior auricular and no occipital adenopathy present.    He has no cervical adenopathy.       Right: No supraclavicular adenopathy present.       Left: No supraclavicular adenopathy present.  Neurological: He is alert and oriented to person, place, and time. No sensory deficit.  Skin: Skin is warm, dry and intact. No rash noted. No cyanosis or erythema. Nails show no clubbing.  Psychiatric: He has a normal mood and affect. His speech is normal and behavior is normal.           Assessment & Plan:   1. Type 2 diabetes mellitus with stage 3 chronic kidney disease, without long-term current use of insulin ([place]) Await labs. Adjust regimen as indicated by results. - Hemoglobin A1c  2. Essential hypertension, benign Controlled. He'll look to see if he has amlodipine in his medication collection  at home. If so, he'll continue it. If not, he'll let me know so I can remove it from his list.  3. Hyperlipidemia LDL goal <70 Await labs. Adjust regimen as indicated by results. He's on max dose of pravastatin. - Lipid panel  4. Microalbuminuria Continue efforts to control diabetes and HTN.  5. Chronic LBP Stable. Continue. STOP meloxicam, as he now takes Xarelto for Afib. - HYDROcodone-acetaminophen (NORCO) 7.5-325 MG tablet; Take 1-2 tablets by mouth daily as needed. For pain  Dispense: 30 tablet; Refill: 0   Fara Chute, PA-C Physician Assistant-Certified [facility]

## 2015-09-27 NOTE — Telephone Encounter (Signed)
Pt was seen today and wanted to let chelle know that he still takes anlodipine 10mg   Best number [phone]

## 2015-09-27 NOTE — Patient Instructions (Addendum)
When you get home, check your pills. See if you are taking amlodipine 10 mg (Norvasc).  If you ARE taking it, keep taking it. If you are NOT taking it, call my office [phone]) and tell the person who answers the phone that you are NOT taking it, so that I can take it off the list.  DO NOT take the Bayer Aspirin.  PLEASE STOP the meloxicam (Mobic).    IF you received an x-ray today, you will receive an invoice from [facility]. Please contact [facility] at [phone] with questions or concerns regarding your invoice.   IF you received labwork today, you will receive an invoice from [employer]. Please contact [facility] at [phone] with questions or concerns regarding your invoice.   Our billing staff will not be able to assist you with questions regarding bills from these companies.  You will be contacted with the lab results as soon as they are available. The fastest way to get your results is to activate your My Chart account. Instructions are located on the last page of this paperwork. If you have not heard from [place] regarding the results in 2 weeks, please contact this office.

## 2015-09-29 ENCOUNTER — Encounter: Payer: Self-pay | Admitting: Physician Assistant

## 2015-10-28 ENCOUNTER — Ambulatory Visit: Payer: Medicare Other | Admitting: Cardiovascular Disease

## 2015-11-02 ENCOUNTER — Encounter: Payer: Self-pay | Admitting: Podiatry

## 2015-11-02 ENCOUNTER — Ambulatory Visit: Payer: Medicare Other | Admitting: Cardiovascular Disease

## 2015-11-02 ENCOUNTER — Ambulatory Visit (INDEPENDENT_AMBULATORY_CARE_PROVIDER_SITE_OTHER): Payer: Medicare Other | Admitting: Podiatry

## 2015-11-02 VITALS — BP 155/105 | HR 71 | Resp 18

## 2015-11-02 DIAGNOSIS — B351 Tinea unguium: Secondary | ICD-10-CM

## 2015-11-02 DIAGNOSIS — M79676 Pain in unspecified toe(s): Secondary | ICD-10-CM

## 2015-11-02 NOTE — Progress Notes (Signed)
Patient ID: Gregg Flores, male   DOB: 1945/08/09, 70 y.o.   MRN: [id number]    Subjective: This patient presents for scheduled visit complaining of elongated and thickened toenails which are uncomfortable when he wears shoes and walks and is requesting toenail debridement  Objective: Orientated 3 DP and PT pulses 2/4 bilaterally Capillary reflex immediate bilaterally Sensation to 10 g monofilament wire intact 5/5 bilaterally Vibratory sensation reactive bilaterally Ankle reflex equal and reactive bilaterally Callus medial nail groove left hallux No open skin lesions bilaterally The toenails are hypertrophic, elongated, discolored, incurvated and tender to direct palpation 6-10  Assessment: Symptomatic onychomycoses 6-10 Diabetic without complications  Plan: Debridement toenails 6-10 mechanically and electronically without any bleeding Apply Vaseline to the left hallux medial nail groove daily and cover with a Band-Aid until the callused softens  Reappoint 3 months

## 2015-11-02 NOTE — Patient Instructions (Signed)
Apply Vaseline to the edges of your left big toenail daily and cover with a Band-Aid until the callus and the nail groove is comfortable  Diabetes and Foot Care Diabetes may cause you to have problems because of poor blood supply (circulation) to your feet and legs. This may cause the skin on your feet to become thinner, break easier, and heal more slowly. Your skin may become dry, and the skin may peel and crack. You may also have nerve damage in your legs and feet causing decreased feeling in them. You may not notice minor injuries to your feet that could lead to infections or more serious problems. Taking care of your feet is one of the most important things you can do for yourself.  HOME CARE INSTRUCTIONS  Wear shoes at all times, even in the house. Do not go barefoot. Bare feet are easily injured.  Check your feet daily for blisters, cuts, and redness. If you cannot see the bottom of your feet, use a mirror or ask someone for help.  Wash your feet with warm water (do not use hot water) and mild soap. Then pat your feet and the areas between your toes until they are completely dry. Do not soak your feet as this can dry your skin.  Apply a moisturizing lotion or petroleum jelly (that does not contain alcohol and is unscented) to the skin on your feet and to dry, brittle toenails. Do not apply lotion between your toes.  Trim your toenails straight across. Do not dig under them or around the cuticle. File the edges of your nails with an emery board or nail file.  Do not cut corns or calluses or try to remove them with medicine.  Wear clean socks or stockings every day. Make sure they are not too tight. Do not wear knee-high stockings since they may decrease blood flow to your legs.  Wear shoes that fit properly and have enough cushioning. To break in new shoes, wear them for just a few hours a day. This prevents you from injuring your feet. Always look in your shoes before you put them on to be  sure there are no objects inside.  Do not cross your legs. This may decrease the blood flow to your feet.  If you find a minor scrape, cut, or break in the skin on your feet, keep it and the skin around it clean and dry. These areas may be cleansed with mild soap and water. Do not cleanse the area with peroxide, alcohol, or iodine.  When you remove an adhesive bandage, be sure not to damage the skin around it.  If you have a wound, look at it several times a day to make sure it is healing.  Do not use heating pads or hot water bottles. They may burn your skin. If you have lost feeling in your feet or legs, you may not know it is happening until it is too late.  Make sure your health care provider performs a complete foot exam at least annually or more often if you have foot problems. Report any cuts, sores, or bruises to your health care provider immediately. SEEK MEDICAL CARE IF:   You have an injury that is not healing.  You have cuts or breaks in the skin.  You have an ingrown nail.  You notice redness on your legs or feet.  You feel burning or tingling in your legs or feet.  You have pain or cramps in your legs  and feet.  Your legs or feet are numb.  Your feet always feel cold. SEEK IMMEDIATE MEDICAL CARE IF:   There is increasing redness, swelling, or pain in or around a wound.  There is a red line that goes up your leg.  Pus is coming from a wound.  You develop a fever or as directed by your health care provider.  You notice a bad smell coming from an ulcer or wound.   This information is not intended to replace advice given to you by your health care provider. Make sure you discuss any questions you have with your health care provider.   Document Released: 01/13/2000 Document Revised: 09/17/2012 Document Reviewed: 06/24/2012 Elsevier Interactive Patient Education [employer].

## 2015-11-02 NOTE — Progress Notes (Signed)
Cardiology Office Note   Date:  11/03/2015   ID:  Gregg Flores, DOB 1945/02/26, MRN [id number]  PCP:  Harrison Mons, PA-C  Cardiologist:   Jenkins Rouge, MD   No chief complaint on file.     History of Present Illness: Gregg Flores is a 70 y.o. male who presents for evaluation of afib.  First seen 07/18/15 Seen by family practice 5/16 and noted to be in afib.  Started on lopressor but no anticoagulation. CRF;s HTN, DM and elevated lipids.  He is unaware of rhythm No palpitations, dyspnea or chest pain No TIA Renal function normal on recent labs Denies ETOH.  Divorced has one son Who lives near him. He is retired Secretary/administrator time to explain things to him   Echo done 08/09/15 showed normal EF and normal LA size  Past Medical History:  Diagnosis Date  . BPH (benign prostatic hyperplasia)   . Cataracts, bilateral   . Chronic LBP   . Colon polyps   . Diverticulosis   . ED (erectile dysfunction)   . Essential hypertension, benign 1990  . Microalbuminuria   . Monoclonal gammopathy 04/2008  . Type II or unspecified type diabetes mellitus without mention of complication, not stated as uncontrolled     Past Surgical History:  Procedure Laterality Date  . TRANSURETHRAL RESECTION OF PROSTATE       Current Outpatient Prescriptions  Medication Sig Dispense Refill  . amLODipine (NORVASC) 10 MG tablet Take 10 mg by mouth daily.  0  . benazepril (LOTENSIN) 40 MG tablet TAKE 1 TABLET BY MOUTH ONCE DAILY. 90 tablet 3  . hydrochlorothiazide (HYDRODIURIL) 25 MG tablet TAKE 1 TABLET BY MOUTH EVERY MORNING 90 tablet 3  . HYDROcodone-acetaminophen (NORCO) 7.5-325 MG tablet Take 1-2 tablets by mouth daily as needed. For pain 30 tablet 0  . ipratropium (ATROVENT) 0.03 % nasal spray Place 2 sprays into the nose every 12 (twelve) hours. 90 mL 3  . meloxicam (MOBIC) 15 MG tablet TAKE 1 TABLET BY MOUTH ONCE DAILY AS NEEDED FOR PAIN. DO  NOT USE WITH ALEVE 90 tablet 3  . metFORMIN (GLUCOPHAGE)  1000 MG tablet Take 1 tablet (1,000 mg total) by mouth 2 (two) times daily with a meal. 180 tablet 3  . metoprolol (LOPRESSOR) 50 MG tablet Take 1 tablet (50 mg total) by mouth 2 (two) times daily. 180 tablet 3  . pravastatin (PRAVACHOL) 80 MG tablet Take 1 tablet (80 mg total) by mouth daily. 90 tablet 3  . rivaroxaban (XARELTO) 20 MG TABS tablet Take 1 tablet (20 mg total) by mouth daily with supper. 30 tablet 11   No current facility-administered medications for this visit.     Allergies:   Review of patient's allergies indicates no known allergies.    Social History:  The patient  reports that he has been smoking Cigarettes.  He has a 43.00 pack-year smoking history. He has never used smokeless tobacco. He reports that he does not drink alcohol or use drugs.   Family History:  The patient's family history includes Diabetes in his brother; Heart disease in his mother; Hypertension in his mother.    ROS:  Please see the history of present illness.   Otherwise, review of systems are positive for none.   All other systems are reviewed and negative.    PHYSICAL EXAM: VS:  BP 132/86   Pulse 98   Ht 5\' 11"  (1.803 m)   Wt 109.8 kg (242 lb)  SpO2 99%   BMI 33.75 kg/m  , BMI Body mass index is 33.75 kg/m. Affect appropriate Overweight black male  HEENT: normal Neck supple with no adenopathy JVP normal no bruits no thyromegaly Lungs clear with no wheezing and good diaphragmatic motion Heart:  S1/S2 no murmur, no rub, gallop or click PMI normal Abdomen: benighn, BS positve, no tenderness, no AAA no bruit.  No HSM or HJR Distal pulses intact with no bruits No edema Neuro non-focal Skin warm and dry No muscular weakness    EKG:  06/14/15  afib rate 112 ? Old IMI  Low voltage  07/18/15 afib rate 99 low voltage axis shift no Q;s inferiorly now    Recent Labs: 06/14/2015: ALT 13 08/09/2015: BUN 13; Creat 1.00; Hemoglobin 15.1; Platelets 243; Potassium 4.5; Sodium 140    Lipid  Panel    Component Value Date/Time   CHOL 138 09/27/2015 0834   TRIG 108 09/27/2015 0834   HDL 27 (L) 09/27/2015 0834   CHOLHDL 5.1 (H) 09/27/2015 0834   VLDL 22 09/27/2015 0834   LDLCALC 89 09/27/2015 0834      Wt Readings from Last 3 Encounters:  11/03/15 109.8 kg (242 lb)  09/27/15 111.1 kg (245 lb)  08/09/15 112.7 kg (248 lb 8 oz)      Other studies Reviewed: Additional studies/ records that were reviewed today include: Primary care notes, labs and ECG .    ASSESSMENT AND PLAN:  1. Afib stopped xarelto on his own Thought it upset his stomach. Willing to try eliquis new script called In f/u next available would still like to consider [facility] but patient has poor insight at this time  2. HTN:  Well controlled.  Continue current medications and low sodium Dash type diet.    3: Chol:   Cholesterol is at goal.  Continue current dose of statin and diet Rx.  No myalgias or side effects.  F/U  LFT's in 6 months. Lab Results  Component Value Date   LDLCALC 89 09/27/2015           4: DM  Discussed low carb diet.  Target hemoglobin A1c is 6.5 or less.  Continue current medications.   Current medicines are reviewed at length with the patient today.  The patient does not have concerns regarding medicines.     Labs/ tests ordered today include:    No orders of the defined types were placed in this encounter.    Disposition:   FU with me       Signed, Jenkins Rouge, MD  11/03/2015 8:34 AM    [facility] [place], [place], [place]  [address] Phone: [phone]; Fax: [phone]

## 2015-11-03 ENCOUNTER — Encounter: Payer: Self-pay | Admitting: Cardiovascular Disease

## 2015-11-03 ENCOUNTER — Other Ambulatory Visit: Payer: Self-pay

## 2015-11-03 ENCOUNTER — Ambulatory Visit (INDEPENDENT_AMBULATORY_CARE_PROVIDER_SITE_OTHER): Payer: Medicare Other | Admitting: Cardiovascular Disease

## 2015-11-03 VITALS — BP 132/86 | HR 98 | Ht 71.0 in | Wt 242.0 lb

## 2015-11-03 DIAGNOSIS — I481 Persistent atrial fibrillation: Secondary | ICD-10-CM

## 2015-11-03 DIAGNOSIS — I4819 Other persistent atrial fibrillation: Secondary | ICD-10-CM

## 2015-11-03 MED ORDER — APIXABAN 2.5 MG PO TABS: 5.0000 mg | ORAL_TABLET | Freq: Two times a day (BID) | ORAL | Status: DC

## 2015-11-03 NOTE — Patient Instructions (Addendum)
Medication Instructions:  Your physician has recommended you make the following change in your medication:  1-STOP Xarelto 2-START Eliquis 5 mg by mouth twice daily  Labwork: NONE  Testing/Procedures: NONE  Follow-Up: Your physician wants you to follow-up in: next available with Dr. Johnsie Cancel.   If you need a refill on your cardiac medications before your next appointment, please call your pharmacy.

## 2015-11-14 ENCOUNTER — Other Ambulatory Visit: Payer: Self-pay | Admitting: *Deleted

## 2015-11-14 DIAGNOSIS — D472 Monoclonal gammopathy: Secondary | ICD-10-CM

## 2015-11-15 ENCOUNTER — Other Ambulatory Visit (HOSPITAL_BASED_OUTPATIENT_CLINIC_OR_DEPARTMENT_OTHER): Payer: Medicare Other

## 2015-11-15 ENCOUNTER — Ambulatory Visit (HOSPITAL_BASED_OUTPATIENT_CLINIC_OR_DEPARTMENT_OTHER): Payer: Medicare Other | Admitting: Hematology

## 2015-11-15 VITALS — BP 133/94 | HR 63 | Temp 98.1°F | Resp 18 | Ht 71.0 in | Wt 236.6 lb

## 2015-11-15 DIAGNOSIS — D472 Monoclonal gammopathy: Secondary | ICD-10-CM | POA: Diagnosis not present

## 2015-11-15 DIAGNOSIS — B1920 Unspecified viral hepatitis C without hepatic coma: Secondary | ICD-10-CM

## 2015-11-15 LAB — CBC & DIFF AND RETIC
BASO%: 0.9 % (ref 0.0–2.0)
BASOS ABS: 0.1 10*3/uL (ref 0.0–0.1)
EOS%: 1.1 % (ref 0.0–7.0)
Eosinophils Absolute: 0.1 10*3/uL (ref 0.0–0.5)
HEMATOCRIT: 43 % (ref 38.4–49.9)
HEMOGLOBIN: 14.4 g/dL (ref 13.0–17.1)
Immature Retic Fract: 7.3 % (ref 3.00–10.60)
LYMPH%: 39 % (ref 14.0–49.0)
MCH: 27.7 pg (ref 27.2–33.4)
MCHC: 33.5 g/dL (ref 32.0–36.0)
MCV: 82.7 fL (ref 79.3–98.0)
MONO#: 0.5 10*3/uL (ref 0.1–0.9)
MONO%: 8.3 % (ref 0.0–14.0)
NEUT#: 2.8 10*3/uL (ref 1.5–6.5)
NEUT%: 50.7 % (ref 39.0–75.0)
PLATELETS: 254 10*3/uL (ref 140–400)
RBC: 5.2 10*6/uL (ref 4.20–5.82)
RDW: 13.2 % (ref 11.0–14.6)
RETIC CT ABS: 65.52 10*3/uL (ref 34.80–93.90)
Retic %: 1.26 % (ref 0.80–1.80)
WBC: 5.5 10*3/uL (ref 4.0–10.3)
lymph#: 2.2 10*3/uL (ref 0.9–3.3)

## 2015-11-15 LAB — COMPREHENSIVE METABOLIC PANEL
ALBUMIN: 3.5 g/dL (ref 3.5–5.0)
ALK PHOS: 86 U/L (ref 40–150)
ALT: 10 U/L (ref 0–55)
ANION GAP: 13 meq/L — AB (ref 3–11)
AST: 14 U/L (ref 5–34)
BILIRUBIN TOTAL: 0.3 mg/dL (ref 0.20–1.20)
BUN: 16.7 mg/dL (ref 7.0–26.0)
CALCIUM: 9.5 mg/dL (ref 8.4–10.4)
CHLORIDE: 106 meq/L (ref 98–109)
CO2: 19 mEq/L — ABNORMAL LOW (ref 22–29)
CREATININE: 1 mg/dL (ref 0.7–1.3)
EGFR: >90 mL/min/{1.73_m2} (ref >90)
Glucose: 164 mg/dl — ABNORMAL HIGH (ref 70–140)
Potassium: 4.2 mEq/L (ref 3.5–5.1)
Sodium: 138 mEq/L (ref 136–145)
Total Protein: 8 g/dL (ref 6.4–8.3)

## 2015-11-17 LAB — PROTEIN ELECTROPHORESIS, SERUM
A/G RATIO SPE: 0.9 (ref 0.7–1.7)
ALBUMIN: 3.6 g/dL (ref 2.9–4.4)
Alpha 1: 0.3 g/dL (ref 0.0–0.4)
Alpha 2: 1.2 g/dL — ABNORMAL HIGH (ref 0.4–1.0)
BETA: 1.1 g/dL (ref 0.7–1.3)
GAMMA GLOBULIN: 1.3 g/dL (ref 0.4–1.8)
Globulin, Total: 3.9 g/dL (ref 2.2–3.9)
Total Protein: 7.5 g/dL (ref 6.0–8.5)

## 2015-11-23 NOTE — Progress Notes (Addendum)
Cardiology Office Note   Date:  11/24/2015   ID:  [name], DOB 04/24/1945, MRN [id number]  PCP:  Harrison Mons, PA-C  Cardiologist:  Dr. Johnsie Cancel    Chief Complaint  Patient presents with  . Atrial Fibrillation      History of Present Illness: Gregg Flores is a 70 y.o. male who presents for atrial fib. CRF;s HTN, DM and elevated lipids.  He is unaware of rhythm No palpitations, dyspnea or chest pain No TIA Renal function normal on recent labs Denies ETOH.  Pt placed on Xarelto with CHA2DS2Vasc score 3.   Echo done with EF 55-60%, aortic root was mildly dilated. Normal LA size.   Pt has been unaware of his atrial fib.  On last visit pt had stopped his xaqrelto - thought it upset his stomach. He was placed on eliquis. Thought to DCCV if pt agrees, but will need 3-4 weeks of Eliquis/. TEE.    Today he feels well without problems. No chest pain or SOB.  We discussed DCCV and TEE in the next couple of weeks to try and maintain SR.  discussed importance of taking the Eliquiis without fail and if he misses any doses to let us know, otherwise he may have a stroke with the DCCV.  Pt understands.     Past Medical History:  Diagnosis Date  . BPH (benign prostatic hyperplasia)   . Cataracts, bilateral   . Chronic LBP   . Colon polyps   . Diverticulosis   . ED (erectile dysfunction)   . Essential hypertension, benign 1990  . Microalbuminuria   . Monoclonal gammopathy 04/2008  . Type II or unspecified type diabetes mellitus without mention of complication, not stated as uncontrolled     Past Surgical History:  Procedure Laterality Date  . TRANSURETHRAL RESECTION OF PROSTATE       Current Outpatient Prescriptions  Medication Sig Dispense Refill  . amLODipine (NORVASC) 10 MG tablet Take 10 mg by mouth daily.  0  . apixaban (ELIQUIS) 5 MG TABS tablet Take 1 tablet (5 mg total) by mouth 2 (two) times daily. 60 tablet 2  . benazepril (LOTENSIN) 40 MG tablet TAKE 1 TABLET  BY MOUTH ONCE DAILY. 90 tablet 3  . hydrochlorothiazide (HYDRODIURIL) 25 MG tablet TAKE 1 TABLET BY MOUTH EVERY MORNING 90 tablet 3  . HYDROcodone-acetaminophen (NORCO) 7.5-325 MG tablet Take 1-2 tablets by mouth daily as needed. For pain 30 tablet 0  . ipratropium (ATROVENT) 0.03 % nasal spray Place 2 sprays into the nose every 12 (twelve) hours. 90 mL 3  . meloxicam (MOBIC) 15 MG tablet TAKE 1 TABLET BY MOUTH ONCE DAILY AS NEEDED FOR PAIN. DO  NOT USE WITH ALEVE 90 tablet 3  . metFORMIN (GLUCOPHAGE) 1000 MG tablet Take 1 tablet (1,000 mg total) by mouth 2 (two) times daily with a meal. 180 tablet 3  . metoprolol (LOPRESSOR) 50 MG tablet Take 1 tablet (50 mg total) by mouth 2 (two) times daily. 180 tablet 3  . pravastatin (PRAVACHOL) 80 MG tablet Take 1 tablet (80 mg total) by mouth daily. 90 tablet 3   No current facility-administered medications for this visit.     Allergies:   Review of patient's allergies indicates no known allergies.    Social History:  The patient  reports that he has been smoking Cigarettes.  He has a 43.00 pack-year smoking history. He has never used smokeless tobacco. He reports that he does not drink alcohol or use  drugs.   Family History:  The patient's family history includes Diabetes in his brother; Heart disease in his mother; Hypertension in his mother.    ROS:  General:no colds or fevers, no weight changes Skin:no rashes or ulcers HEENT:no blurred vision, no congestion CV:see HPI PUL:see HPI GI:no diarrhea constipation or melena, no indigestion GU:no hematuria, no dysuria MS:no joint pain, no claudication Neuro:no syncope, no lightheadedness Endo:+ diabetes- stable, no thyroid disease  Wt Readings from Last 3 Encounters:  11/24/15 236 lb 12.8 oz (107.4 kg)  11/15/15 236 lb 9.6 oz (107.3 kg)  11/03/15 242 lb (109.8 kg)     PHYSICAL EXAM: VS:  BP 130/82   Pulse 100   Ht 5\' 11"  (1.803 m)   Wt 236 lb 12.8 oz (107.4 kg)   BMI 33.03 kg/m  , BMI  Body mass index is 33.03 kg/m. General:Pleasant affect, NAD Skin:Warm and dry, brisk capillary refill HEENT:normocephalic, sclera clear, mucus membranes moist Neck:supple, no JVD, no bruits  Heart:irreg irreg without murmur, gallup, rub or click Lungs:clear without rales, rhonchi, or wheezes [id number], non tender, + BS, do not palpate liver spleen or masses Ext:no lower ext edema, 2+ pedal pulses, 2+ radial pulses Neuro:alert and oriented X 3, MAE, follows commands, + facial symmetry    EKG:  EKG is ordered today. The ekg ordered today demonstrates a fiv with RVR at 101, LAD no acute changes otherwise.    Recent Labs: 11/15/2015: ALT 10; HGB 14.4; Platelets 254 11/24/2015: BUN 16; Creat 1.02; Potassium 4.2; Sodium 138    Lipid Panel    Component Value Date/Time   CHOL 138 09/27/2015 0834   TRIG 108 09/27/2015 0834   HDL 27 (L) 09/27/2015 0834   CHOLHDL 5.1 (H) 09/27/2015 0834   VLDL 22 09/27/2015 0834   LDLCALC 89 09/27/2015 0834       Other studies Reviewed: Additional studies/ records that were reviewed today include: . ECHO 08/09/15 Study Conclusions  - Left ventricle: The cavity size was normal. Systolic function was   normal. The estimated ejection fraction was in the range of 55%   to 60%. Wall motion was normal; there were no regional wall   motion abnormalities. The study was not technically sufficient to   allow evaluation of LV diastolic dysfunction due to atrial   fibrillation. - Aortic valve: Trileaflet; mildly thickened, mildly calcified   leaflets. Moderate focal calcification involving the noncoronary   cusp. - Aorta: Aortic root dimension: 39 mm (ED). Ascending aorta   diameter: 4.39 mm (ED). - Aortic root: The aortic root was mildly dilated. - Ascending aorta: The ascending aorta was mildly dilated. - Mitral valve: MIldly calcified chordae tendinae - Right ventricle: The cavity size was mildly dilated. Wall   thickness was  normal.   ASSESSMENT AND PLAN:  1. Afib stopped xarelto on his own Thought it upset his stomach. Has been on Eliquis since 11/03/15. He has script and we gave samples today.   We discussed TEE DCCV and pt has agreed to procedd all questions answered.    [facility] has been requested to perform a transesophageal echocardiogram on Gregg Flores for atrial fib.  After careful review of history and examination, the risks and benefits of transesophageal echocardiogram have been explained including risks of esophageal damage, perforation (1:10,000 risk), bleeding, pharyngeal hematoma as well as other potential complications associated with conscious sedation including aspiration, arrhythmia, respiratory failure and death. Alternatives to treatment were discussed, questions were answered. Patient is willing to proceed.  2. HTN:  Well controlled.  Continue current medications and low sodium Dash type diet.   as before 3: Chol:   Cholesterol is at goal.  Continue current dose of statin and diet Rx.  No myalgias or side effects.  F/U  LFT's in 6 months. RecentLabs       Lab Results  Component Value Date   LDLCALC 89 09/27/2015                     4: DM  Discussed low carb diet.  Target hemoglobin A1c is 6.5 or less.  Continue current medications  5. Tobacco use, discussed stopping.  Current medicines are reviewed with the patient today.  The patient Has no concerns regarding medicines.  The following changes have been made:  See above Labs/ tests ordered today include:see above  Disposition:   FU:  see above  Signed, Cecilie Kicks, NP  11/24/2015 [facility] [place], [place], [place] Phone: [phone]; Fax: [phone]

## 2015-11-24 ENCOUNTER — Ambulatory Visit (INDEPENDENT_AMBULATORY_CARE_PROVIDER_SITE_OTHER): Payer: Medicare Other | Admitting: Cardiology

## 2015-11-24 ENCOUNTER — Encounter: Payer: Self-pay | Admitting: Cardiology

## 2015-11-24 ENCOUNTER — Encounter: Payer: Self-pay | Admitting: *Deleted

## 2015-11-24 ENCOUNTER — Encounter (INDEPENDENT_AMBULATORY_CARE_PROVIDER_SITE_OTHER): Payer: Self-pay

## 2015-11-24 VITALS — BP 130/82 | HR 100 | Ht 71.0 in | Wt 236.8 lb

## 2015-11-24 DIAGNOSIS — I4819 Other persistent atrial fibrillation: Secondary | ICD-10-CM

## 2015-11-24 DIAGNOSIS — I4891 Unspecified atrial fibrillation: Secondary | ICD-10-CM

## 2015-11-24 DIAGNOSIS — Z7901 Long term (current) use of anticoagulants: Secondary | ICD-10-CM

## 2015-11-24 DIAGNOSIS — E782 Mixed hyperlipidemia: Secondary | ICD-10-CM

## 2015-11-24 DIAGNOSIS — I10 Essential (primary) hypertension: Secondary | ICD-10-CM

## 2015-11-24 DIAGNOSIS — I481 Persistent atrial fibrillation: Secondary | ICD-10-CM

## 2015-11-24 LAB — CBC WITH DIFFERENTIAL/PLATELET
BASOS ABS: 57 {cells}/uL (ref 0–200)
Basophils Relative: 1 %
EOS ABS: 57 {cells}/uL (ref 15–500)
Eosinophils Relative: 1 %
HCT: 46.4 % (ref 38.5–50.0)
Hemoglobin: 15.4 g/dL (ref 13.2–17.1)
LYMPHS PCT: 48 %
Lymphs Abs: 2736 cells/uL (ref 850–3900)
MCH: 28.1 pg (ref 27.0–33.0)
MCHC: 33.2 g/dL (ref 32.0–36.0)
MCV: 84.7 fL (ref 80.0–100.0)
MONOS PCT: 8 %
MPV: 9.7 fL (ref 7.5–12.5)
Monocytes Absolute: 456 cells/uL (ref 200–950)
NEUTROS PCT: 42 %
Neutro Abs: 2394 cells/uL (ref 1500–7800)
PLATELETS: 283 10*3/uL (ref 140–400)
RBC: 5.48 MIL/uL (ref 4.20–5.80)
RDW: 13.7 % (ref 11.0–15.0)
WBC: 5.7 10*3/uL (ref 3.8–10.8)

## 2015-11-24 LAB — BASIC METABOLIC PANEL
BUN: 16 mg/dL (ref 7–25)
CHLORIDE: 103 mmol/L (ref 98–110)
CO2: 24 mmol/L (ref 20–31)
CREATININE: 1.02 mg/dL (ref 0.70–1.18)
Calcium: 9.7 mg/dL (ref 8.6–10.3)
GLUCOSE: 97 mg/dL (ref 65–99)
Potassium: 4.2 mmol/L (ref 3.5–5.3)
Sodium: 138 mmol/L (ref 135–146)

## 2015-11-24 LAB — PROTIME-INR
INR: 1.1
Prothrombin Time: 11.8 s — ABNORMAL HIGH (ref 9.0–11.5)

## 2015-11-24 MED ORDER — APIXABAN 5 MG PO TABS: 5.0000 mg | ORAL_TABLET | Freq: Two times a day (BID) | ORAL | 2 refills | Status: DC

## 2015-11-24 NOTE — Patient Instructions (Addendum)
Medication Instructions:  Your physician recommends that you continue on your current medications as directed. Please refer to the Current Medication list given to you today.   Labwork: TODAY:  BMET, CBC W/DIFF, & PT/INR  Testing/Procedures: Your physician has requested that you have a TEE/Cardioversion 12-08-15. During a TEE, sound waves are used to create images of your heart. It provides your doctor with information about the size and shape of your heart and how well your heart's chambers and valves are working. In this test, a transducer is attached to the end of a flexible tube that is guided down you throat and into your esophagus (the tube leading from your mouth to your stomach) to get a more detailed image of your heart. Once the TEE has determined that a blood clot is not present, the cardioversion begins. Electrical Cardioversion uses a jolt of electricity to your heart either through paddles or wired patches attached to your chest. This is a controlled, usually prescheduled, procedure. This procedure is done at the hospital and you are not awake during the procedure. You usually go home the day of the procedure. Please see the instruction sheet given to you today for more information.   Follow-Up: Your physician recommends that you schedule a follow-up appointment in: 2 WEEKS AFTER 12-08-15 WITH DR. Johnsie Cancel OR AN APP ON HIS TEAM   Any Other Special Instructions Will Be Listed Below (If Applicable).  Transesophageal Echocardiogram Transesophageal echocardiography (TEE) is a special type of test that produces images of the heart by using sound waves (echocardiogram). This type of echocardiography can obtain better images of the heart than standard echocardiography. TEE is done by passing a flexible tube down the esophagus. The heart is located in front of the esophagus. Because the heart and esophagus are close to one another, your health care provider can take very clear, detailed pictures  of the heart via ultrasound waves. TEE may be done:  If your health care provider needs more information based on standard echocardiography findings.  If you had a stroke. This might have happened because a clot formed in your heart. TEE can visualize different areas of the heart and check for clots.  To check valve anatomy and function.  To check for infection on the inside of your heart (endocarditis).  To evaluate the dividing wall (septum) of the heart and presence of a hole that did not close after birth (patent foramen ovale or atrial septal defect).  To help diagnose a tear in the wall of the aorta (aortic dissection).  During cardiac valve surgery. This allows the surgeon to assess the valve repair before closing the chest.  During a variety of other cardiac procedures to guide positioning of catheters.  Sometimes before a cardioversion, which is a shock to convert heart rhythm back to normal. LET [facility] CARE PROVIDER KNOW ABOUT:   Any allergies you have.  All medicines you are taking, including vitamins, herbs, eye drops, creams, and over-the-counter medicines.  Previous problems you or members of your family have had with the use of anesthetics.  Any blood disorders you have.  Previous surgeries you have had.  Medical conditions you have.  Swallowing difficulties.  An esophageal obstruction. RISKS AND COMPLICATIONS  Generally, TEE is a safe procedure. However, as with any procedure, complications can occur. Possible complications include an esophageal tear (rupture). BEFORE THE PROCEDURE   Do not eat or drink for 6 hours before the procedure or as directed by your health care provider.  Arrange for someone to drive you home after the procedure. Do not drive yourself home. During the procedure, you will be given medicines that can continue to make you feel drowsy and can impair your reflexes.  An IV access tube will be started in the arm. PROCEDURE   A  medicine to help you relax (sedative) will be given through the IV access tube.  A medicine may be sprayed or gargled to numb the back of the throat.  Your blood pressure, heart rate, and breathing (vital signs) will be monitored during the procedure.  The TEE probe is a long, flexible tube. The tip of the probe is placed into the back of the mouth, and you will be asked to swallow. This helps to pass the tip of the probe into the esophagus. Once the tip of the probe is in the correct area, your health care provider can take pictures of the heart.  TEE is usually not a painful procedure. You may feel the probe press against the back of the throat. The probe does not enter the trachea and does not affect your breathing. AFTER THE PROCEDURE   You will be in bed, resting, until you have fully returned to consciousness.  When you first awaken, your throat may feel slightly sore and will probably still feel numb. This will improve slowly over time.  You will not be allowed to eat or drink until it is clear that the numbness has improved.  Once you have been able to drink, urinate, and sit on the edge of the bed without feeling sick to your stomach (nausea) or dizzy, you may be cleared to go home.  You should have a friend or family member with you for the next 24 hours after your procedure.   This information is not intended to replace advice given to you by your health care provider. Make sure you discuss any questions you have with your health care provider.   Document Released: 04/07/2002 Document Revised: 01/20/2013 Document Reviewed: 07/17/2012 Elsevier Interactive Patient Education 2016 [employer].   Hospital doctor cardioversion is the delivery of a jolt of electricity to change the rhythm of the heart. Sticky patches or metal paddles are placed on the chest to deliver the electricity from a device. This is done to restore a normal rhythm. A rhythm that is too  fast or not regular keeps the heart from pumping well. Electrical cardioversion is done in an emergency if:   There is low or no blood pressure as a result of the heart rhythm.   Normal rhythm must be restored as fast as possible to protect the brain and heart from further damage.   It may save a life. Cardioversion may be done for heart rhythms that are not immediately life threatening, such as atrial fibrillation or flutter, in which:   The heart is beating too fast or is not regular.   Medicine to change the rhythm has not worked.   It is safe to wait in order to allow time for preparation.  Symptoms of the abnormal rhythm are bothersome.  The risk of stroke and other serious problems can be reduced. LET [facility] CARE PROVIDER KNOW ABOUT:   Any allergies you have.  All medicines you are taking, including vitamins, herbs, eye drops, creams, and over-the-counter medicines.  Previous problems you or members of your family have had with the use of anesthetics.   Any blood disorders you have.   Previous surgeries you have  had.   Medical conditions you have. RISKS AND COMPLICATIONS  Generally, this is a safe procedure. However, problems can occur and include:   Breathing problems related to the anesthetic used.  A blood clot that breaks free and travels to other parts of your body. This could cause a stroke or other problems. The risk of this is lowered by use of blood-thinning medicine (anticoagulant) prior to the procedure.  Cardiac arrest (rare). BEFORE THE PROCEDURE   You may have tests to detect blood clots in your heart and to evaluate heart function.  You may start taking anticoagulants so your blood does not clot as easily.   Medicines may be given to help stabilize your heart rate and rhythm. PROCEDURE  You will be given medicine through an IV tube to reduce discomfort and make you sleepy (sedative).   An electrical shock will be delivered. AFTER  THE PROCEDURE Your heart rhythm will be watched to make sure it does not change.    This information is not intended to replace advice given to you by your health care provider. Make sure you discuss any questions you have with your health care provider.   Document Released: 01/05/2002 Document Revised: 02/05/2014 Document Reviewed: 07/30/2012 Elsevier Interactive Patient Education [employer].    If you need a refill on your cardiac medications before your next appointment, please call your pharmacy.

## 2015-11-25 ENCOUNTER — Telehealth: Payer: Self-pay

## 2015-11-25 NOTE — Telephone Encounter (Signed)
Prior auth for Eliquis 5mg  obtained from [facility]. [id number], is good through 01/28/2017.

## 2015-12-08 ENCOUNTER — Encounter (HOSPITAL_COMMUNITY): Payer: Self-pay | Admitting: *Deleted

## 2015-12-08 ENCOUNTER — Ambulatory Visit (HOSPITAL_COMMUNITY): Payer: Medicare Other | Admitting: Anesthesiology

## 2015-12-08 ENCOUNTER — Encounter (HOSPITAL_COMMUNITY)
Admission: RE | Disposition: A | Discharge status: Home / Self Care | Payer: Self-pay | Source: Ambulatory Visit | Attending: Cardiology

## 2015-12-08 ENCOUNTER — Ambulatory Visit (HOSPITAL_BASED_OUTPATIENT_CLINIC_OR_DEPARTMENT_OTHER): Payer: Medicare Other

## 2015-12-08 ENCOUNTER — Ambulatory Visit (HOSPITAL_COMMUNITY)
Admission: RE | Admit: 2015-12-08 | Discharge: 2015-12-08 | Disposition: A | Discharge status: Home / Self Care | Payer: Medicare Other | Source: Ambulatory Visit | Attending: Cardiology | Admitting: Cardiology

## 2015-12-08 DIAGNOSIS — N400 Enlarged prostate without lower urinary tract symptoms: Secondary | ICD-10-CM | POA: Diagnosis not present

## 2015-12-08 DIAGNOSIS — E1129 Type 2 diabetes mellitus with other diabetic kidney complication: Secondary | ICD-10-CM | POA: Diagnosis not present

## 2015-12-08 DIAGNOSIS — M545 Low back pain: Secondary | ICD-10-CM | POA: Diagnosis not present

## 2015-12-08 DIAGNOSIS — Z7901 Long term (current) use of anticoagulants: Secondary | ICD-10-CM | POA: Insufficient documentation

## 2015-12-08 DIAGNOSIS — I4891 Unspecified atrial fibrillation: Secondary | ICD-10-CM | POA: Insufficient documentation

## 2015-12-08 DIAGNOSIS — I10 Essential (primary) hypertension: Secondary | ICD-10-CM | POA: Diagnosis not present

## 2015-12-08 DIAGNOSIS — I129 Hypertensive chronic kidney disease with stage 1 through stage 4 chronic kidney disease, or unspecified chronic kidney disease: Secondary | ICD-10-CM | POA: Insufficient documentation

## 2015-12-08 DIAGNOSIS — N189 Chronic kidney disease, unspecified: Secondary | ICD-10-CM | POA: Diagnosis not present

## 2015-12-08 DIAGNOSIS — E1122 Type 2 diabetes mellitus with diabetic chronic kidney disease: Secondary | ICD-10-CM | POA: Insufficient documentation

## 2015-12-08 DIAGNOSIS — F17210 Nicotine dependence, cigarettes, uncomplicated: Secondary | ICD-10-CM | POA: Insufficient documentation

## 2015-12-08 DIAGNOSIS — I341 Nonrheumatic mitral (valve) prolapse: Secondary | ICD-10-CM

## 2015-12-08 DIAGNOSIS — Z7984 Long term (current) use of oral hypoglycemic drugs: Secondary | ICD-10-CM | POA: Diagnosis not present

## 2015-12-08 DIAGNOSIS — I340 Nonrheumatic mitral (valve) insufficiency: Secondary | ICD-10-CM | POA: Diagnosis not present

## 2015-12-08 DIAGNOSIS — G8929 Other chronic pain: Secondary | ICD-10-CM | POA: Insufficient documentation

## 2015-12-08 DIAGNOSIS — E785 Hyperlipidemia, unspecified: Secondary | ICD-10-CM | POA: Diagnosis not present

## 2015-12-08 HISTORY — PX: CARDIOVERSION: SHX1299

## 2015-12-08 HISTORY — PX: TEE WITHOUT CARDIOVERSION: SHX5443

## 2015-12-08 SURGERY — CARDIOVERSION
Anesthesia: Monitor Anesthesia Care

## 2015-12-08 MED ORDER — LACTATED RINGERS IV SOLN: INTRAVENOUS | Status: DC | PRN

## 2015-12-08 MED ORDER — SODIUM CHLORIDE 0.9 % IV SOLN
INTRAVENOUS | Status: DC
  Administered 2015-12-08: 500 mL via INTRAVENOUS
  Administered 2015-12-08: 11:00:00 via INTRAVENOUS

## 2015-12-08 MED ORDER — LIDOCAINE HCL (CARDIAC) 20 MG/ML IV SOLN
INTRAVENOUS | Status: DC | PRN
  Administered 2015-12-08: 50 mg via INTRAVENOUS

## 2015-12-08 MED ORDER — PROPOFOL 10 MG/ML IV BOLUS
INTRAVENOUS | Status: DC | PRN
  Administered 2015-12-08: 50 mg via INTRAVENOUS
  Administered 2015-12-08 (×2): 30 mg via INTRAVENOUS
  Administered 2015-12-08: 60 mg via INTRAVENOUS
  Administered 2015-12-08: 80 mg via INTRAVENOUS

## 2015-12-08 NOTE — Anesthesia Procedure Notes (Signed)
Procedure Name: MAC Date/Time: 12/08/2015 12:07 PM Performed by: Neldon Newport Pre-anesthesia Checklist: Timeout performed, Patient being monitored, Suction available, Emergency Drugs available and Patient identified Patient Re-evaluated:Patient Re-evaluated prior to inductionOxygen Delivery Method: Nasal cannula Placement Confirmation: positive ETCO2

## 2015-12-08 NOTE — Transfer of Care (Signed)
Immediate Anesthesia Transfer of Care Note  Patient: Gregg Flores  Procedure(s) Performed: Procedure(s): CARDIOVERSION (N/A) TRANSESOPHAGEAL ECHOCARDIOGRAM (TEE) (N/A)  Patient Location: Endoscopy Unit  Anesthesia Type:General  Level of Consciousness: awake, alert  and oriented  Airway & Oxygen Therapy: Patient Spontanous Breathing and Patient connected to nasal cannula oxygen  Post-op Assessment: Report given to RN, Post -op Vital signs reviewed and stable, Patient moving all extremities X 4 and Patient able to stick tongue midline  Post vital signs: Reviewed and stable  Last Vitals:  Vitals:   12/08/15 1046  BP: (!) 123/97  Resp: (!) 24  Temp: 36.5 C    Last Pain:  Vitals:   12/08/15 1046  TempSrc: Oral         Complications: No apparent anesthesia complications

## 2015-12-08 NOTE — CV Procedure (Signed)
Procedure: TEE  Indication: Atrial fibrillation  Sedation: Per anesthesiology.  Findings: Please see echo section for full report.  The patient was in rapid atrial fibrillation.  Normal LV size with moderate LV hypertrophy.  EF 45%, diffuse hypokinesis.  Normal RV size and systolic function.  Mild left atrial enlargement, no LA appendage thrombus.  Normal right atrium.  Trileaflet aortic valve with no stenosis or regurgitation.  Trivial MR.  There appeared to be a loose chord attached to the anterior mitral leaflet.  However, there was only mild mitral regurgitation.  No PFO/ASD, negative bubble study.  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     May proceed to DCCV.  Would repeat echo in sinus rhythm to confirm EF.   Gregg Flores 12/08/2015 12:34 PM

## 2015-12-08 NOTE — Procedures (Addendum)
Electrical Cardioversion Procedure Note Gregg Flores [id number] 1945/04/19  Procedure: Electrical Cardioversion Indications:  Atrial Fibrillation  Procedure Details Consent: Risks of procedure as well as the alternatives and risks of each were explained to the (patient/caregiver).  Consent for procedure obtained. Time Out: Verified patient identification, verified procedure, site/side was marked, verified correct patient position, special equipment/implants available, medications/allergies/relevent history reviewed, required imaging and test results available.  Performed  Patient placed on cardiac monitor, pulse oximetry, supplemental oxygen as necessary.  Sedation given: Propofol per anesthesiology Pacer pads placed anterior and posterior chest.  Cardioverted 1 time(s).  Cardioverted at [place].  Evaluation Findings: Post procedure EKG shows: NSR Complications: None Patient did tolerate procedure well.   Loralie Champagne 12/08/2015, 12:35 PM

## 2015-12-08 NOTE — Progress Notes (Signed)
  Echocardiogram Echocardiogram Transesophageal has been performed.  Bobbye Charleston 12/08/2015, 12:46 PM

## 2015-12-08 NOTE — Discharge Instructions (Signed)
Electrical Cardioversion, Care After °Refer to this sheet in the next few weeks. These instructions provide you with information on caring for yourself after your procedure. Your health care provider may also give you more specific instructions. Your treatment has been planned according to current medical practices, but problems sometimes occur. Call your health care provider if you have any problems or questions after your procedure. °WHAT TO EXPECT AFTER THE PROCEDURE °After your procedure, it is typical to have the following sensations: °· Some redness on the skin where the shocks were delivered. If this is tender, a sunburn lotion or hydrocortisone cream may help. °· Possible return of an abnormal heart rhythm within hours or days after the procedure. °HOME CARE INSTRUCTIONS °· Take medicines only as directed by your health care provider. Be sure you understand how and when to take your medicine. °· Learn how to feel your pulse and check it often. °· Limit your activity for 48 hours after the procedure or as directed by your health care provider. °· Avoid or minimize caffeine and other stimulants as directed by your health care provider. °SEEK MEDICAL CARE IF: °· You feel like your heart is beating too fast or your pulse is not regular. °· You have any questions about your medicines. °· You have bleeding that will not stop. °SEEK IMMEDIATE MEDICAL CARE IF: °· You are dizzy or feel faint. °· It is hard to breathe or you feel short of breath. °· There is a change in discomfort in your chest. °· Your speech is slurred or you have trouble moving an arm or leg on one side of your body. °· You get a serious muscle cramp that does not go away. °· Your fingers or toes turn cold or blue. °  °This information is not intended to replace advice given to you by your health care provider. Make sure you discuss any questions you have with your health care provider. °  °Document Released: 11/05/2012 Document Revised: 02/05/2014  Document Reviewed: 11/05/2012 °Elsevier Interactive Patient Education ©2016 Elsevier Inc. °Transesophageal Echocardiogram °Transesophageal echocardiography (TEE) is a picture test of your heart using sound waves. The pictures taken can give very detailed pictures of your heart. This can help your doctor see if there are problems with your heart. TEE can check: °· If your heart has blood clots in it. °· How well your heart valves are working. °· If you have an infection on the inside of your heart. °· Some of the major arteries of your heart. °· If your heart valve is working after a repair. °· Your heart before a procedure that uses a shock to your heart to get the rhythm back to normal. °BEFORE THE PROCEDURE °· Do not eat or drink for 6 hours before the procedure or as told by your doctor. °· Make plans to have someone drive you home after the procedure. Do not drive yourself home. °· An IV tube will be put in your arm. °PROCEDURE °· You will be given a medicine to help you relax (sedative). It will be given through the IV tube. °· A numbing medicine will be sprayed or gargled in the back of your throat to help numb it. °· The tip of the probe is placed into the back of your mouth. You will be asked to swallow. This helps to pass the probe into your esophagus. °· Once the tip of the probe is in the right place, your doctor can take pictures of your heart. °· You   may feel pressure at the back of your throat. °AFTER THE PROCEDURE °· You will be taken to a recovery area so the sedative can wear off. °· Your throat may be sore and scratchy. This will go away slowly over time. °· You will go home when you are fully awake and able to swallow liquids. °· You should have someone stay with you for the next 24 hours. °· Do not drive or operate machinery for the next 24 hours. °  °This information is not intended to replace advice given to you by your health care provider. Make sure you discuss any questions you have with  your health care provider. °  °Document Released: 11/12/2008 Document Revised: 01/20/2013 Document Reviewed: 07/17/2012 °Elsevier Interactive Patient Education ©2016 Elsevier Inc. ° °

## 2015-12-08 NOTE — Anesthesia Postprocedure Evaluation (Signed)
Anesthesia Post Note  Patient: Gregg Flores  Procedure(s) Performed: Procedure(s) (LRB): CARDIOVERSION (N/A) TRANSESOPHAGEAL ECHOCARDIOGRAM (TEE) (N/A)  Patient location during evaluation: PACU Anesthesia Type: General Level of consciousness: awake, awake and alert and oriented Pain management: pain level controlled Vital Signs Assessment: post-procedure vital signs reviewed and stable Respiratory status: spontaneous breathing, nonlabored ventilation and respiratory function stable Cardiovascular status: blood pressure returned to baseline Anesthetic complications: no    Last Vitals:  Vitals:   12/08/15 1310 12/08/15 1317  BP: 108/71   Pulse: (!) 51 (!) 53  Resp: (!) 22 (!) 21  Temp:      Last Pain:  Vitals:   12/08/15 1251  TempSrc: Oral                 [name]

## 2015-12-08 NOTE — Interval H&P Note (Signed)
History and Physical Interval Note:  12/08/2015 12:19 PM  Gregg Flores  has presented today for surgery, with the diagnosis of AFIB  The various methods of treatment have been discussed with the patient and family. After consideration of risks, benefits and other options for treatment, the patient has consented to  Procedure(s): CARDIOVERSION (N/A) TRANSESOPHAGEAL ECHOCARDIOGRAM (TEE) (N/A) as a surgical intervention .  The patient's history has been reviewed, patient examined, no change in status, stable for surgery.  I have reviewed the patient's chart and labs.  Questions were answered to the patient's satisfaction.     [place] [employer]

## 2015-12-08 NOTE — H&P (View-Only) (Signed)
Cardiology Office Note   Date:  11/24/2015   ID:  Gregg Flores, DOB [birth year], MRN [id number]  PCP:  Gregg Mons, PA-C  Cardiologist:  Dr. Johnsie Cancel    Chief Complaint  Patient presents with  . Atrial Fibrillation      History of Present Illness: Gregg Flores is a 70 y.o. male who presents for atrial fib. CRF;s HTN, DM and elevated lipids.  He is unaware of rhythm No palpitations, dyspnea or chest pain No TIA Renal function normal on recent labs Denies ETOH.  Pt placed on Xarelto with CHA2DS2Vasc score 3.   Echo done with EF 55-60%, aortic root was mildly dilated. Normal LA size.   Pt has been unaware of his atrial fib.  On last visit pt had stopped his xaqrelto - thought it upset his stomach. He was placed on eliquis. Thought to DCCV if pt agrees, but will need 3-4 weeks of Eliquis/. TEE.    Today he feels well without problems. No chest pain or SOB.  We discussed DCCV and TEE in the next couple of weeks to try and maintain SR.  discussed importance of taking the Eliquiis without fail and if he misses any doses to let us know, otherwise he may have a stroke with the DCCV.  Pt understands.     Past Medical History:  Diagnosis Date  . BPH (benign prostatic hyperplasia)   . Cataracts, bilateral   . Chronic LBP   . Colon polyps   . Diverticulosis   . ED (erectile dysfunction)   . Essential hypertension, benign 1990  . Microalbuminuria   . Monoclonal gammopathy 04/2008  . Type II or unspecified type diabetes mellitus without mention of complication, not stated as uncontrolled     Past Surgical History:  Procedure Laterality Date  . TRANSURETHRAL RESECTION OF PROSTATE       Current Outpatient Prescriptions  Medication Sig Dispense Refill  . amLODipine (NORVASC) 10 MG tablet Take 10 mg by mouth daily.  0  . apixaban (ELIQUIS) 5 MG TABS tablet Take 1 tablet (5 mg total) by mouth 2 (two) times daily. 60 tablet 2  . benazepril (LOTENSIN) 40 MG tablet TAKE 1 TABLET  BY MOUTH ONCE DAILY. 90 tablet 3  . hydrochlorothiazide (HYDRODIURIL) 25 MG tablet TAKE 1 TABLET BY MOUTH EVERY MORNING 90 tablet 3  . HYDROcodone-acetaminophen (NORCO) 7.5-325 MG tablet Take 1-2 tablets by mouth daily as needed. For pain 30 tablet 0  . ipratropium (ATROVENT) 0.03 % nasal spray Place 2 sprays into the nose every 12 (twelve) hours. 90 mL 3  . meloxicam (MOBIC) 15 MG tablet TAKE 1 TABLET BY MOUTH ONCE DAILY AS NEEDED FOR PAIN. DO  NOT USE WITH ALEVE 90 tablet 3  . metFORMIN (GLUCOPHAGE) 1000 MG tablet Take 1 tablet (1,000 mg total) by mouth 2 (two) times daily with a meal. 180 tablet 3  . metoprolol (LOPRESSOR) 50 MG tablet Take 1 tablet (50 mg total) by mouth 2 (two) times daily. 180 tablet 3  . pravastatin (PRAVACHOL) 80 MG tablet Take 1 tablet (80 mg total) by mouth daily. 90 tablet 3   No current facility-administered medications for this visit.     Allergies:   Review of patient's allergies indicates no known allergies.    Social History:  The patient  reports that he has been smoking Cigarettes.  He has a 43.00 pack-year smoking history. He has never used smokeless tobacco. He reports that he does not drink alcohol or use  drugs.   Family History:  The patient's family history includes Diabetes in his brother; Heart disease in his mother; Hypertension in his mother.    ROS:  General:no colds or fevers, no weight changes Skin:no rashes or ulcers HEENT:no blurred vision, no congestion CV:see HPI PUL:see HPI GI:no diarrhea constipation or melena, no indigestion GU:no hematuria, no dysuria MS:no joint pain, no claudication Neuro:no syncope, no lightheadedness Endo:+ diabetes- stable, no thyroid disease  Wt Readings from Last 3 Encounters:  11/24/15 236 lb 12.8 oz (107.4 kg)  11/15/15 236 lb 9.6 oz (107.3 kg)  11/03/15 242 lb (109.8 kg)     PHYSICAL EXAM: VS:  BP 130/82   Pulse 100   Ht 5\' 11"  (1.803 m)   Wt 236 lb 12.8 oz (107.4 kg)   BMI 33.03 kg/m  , BMI  Body mass index is 33.03 kg/m. General:Pleasant affect, NAD Skin:Warm and dry, brisk capillary refill HEENT:normocephalic, sclera clear, mucus membranes moist Neck:supple, no JVD, no bruits  Heart:irreg irreg without murmur, gallup, rub or click Lungs:clear without rales, rhonchi, or wheezes [id number], non tender, + BS, do not palpate liver spleen or masses Ext:no lower ext edema, 2+ pedal pulses, 2+ radial pulses Neuro:alert and oriented X 3, MAE, follows commands, + facial symmetry    EKG:  EKG is ordered today. The ekg ordered today demonstrates a fiv with RVR at 101, LAD no acute changes otherwise.    Recent Labs: 11/15/2015: ALT 10; HGB 14.4; Platelets 254 11/24/2015: BUN 16; Creat 1.02; Potassium 4.2; Sodium 138    Lipid Panel    Component Value Date/Time   CHOL 138 09/27/2015 0834   TRIG 108 09/27/2015 0834   HDL 27 (L) 09/27/2015 0834   CHOLHDL 5.1 (H) 09/27/2015 0834   VLDL 22 09/27/2015 0834   LDLCALC 89 09/27/2015 0834       Other studies Reviewed: Additional studies/ records that were reviewed today include: . ECHO 08/09/15 Study Conclusions  - Left ventricle: The cavity size was normal. Systolic function was   normal. The estimated ejection fraction was in the range of 55%   to 60%. Wall motion was normal; there were no regional wall   motion abnormalities. The study was not technically sufficient to   allow evaluation of LV diastolic dysfunction due to atrial   fibrillation. - Aortic valve: Trileaflet; mildly thickened, mildly calcified   leaflets. Moderate focal calcification involving the noncoronary   cusp. - Aorta: Aortic root dimension: 39 mm (ED). Ascending aorta   diameter: 4.39 mm (ED). - Aortic root: The aortic root was mildly dilated. - Ascending aorta: The ascending aorta was mildly dilated. - Mitral valve: MIldly calcified chordae tendinae - Right ventricle: The cavity size was mildly dilated. Wall   thickness was  normal.   ASSESSMENT AND PLAN:  1. Afib stopped xarelto on his own Thought it upset his stomach. Has been on Eliquis since 11/03/15. He has script and we gave samples today.   We discussed TEE DCCV and pt has agreed to procedd all questions answered.    [facility] has been requested to perform a transesophageal echocardiogram on Rayshad T Dutan for atrial fib.  After careful review of history and examination, the risks and benefits of transesophageal echocardiogram have been explained including risks of esophageal damage, perforation (1:10,000 risk), bleeding, pharyngeal hematoma as well as other potential complications associated with conscious sedation including aspiration, arrhythmia, respiratory failure and death. Alternatives to treatment were discussed, questions were answered. Patient is willing to proceed.  2. HTN:  Well controlled.  Continue current medications and low sodium Dash type diet.   as before 3: Chol:   Cholesterol is at goal.  Continue current dose of statin and diet Rx.  No myalgias or side effects.  F/U  LFT's in 6 months. RecentLabs       Lab Results  Component Value Date   LDLCALC 89 09/27/2015                     4: DM  Discussed low carb diet.  Target hemoglobin A1c is 6.5 or less.  Continue current medications  5. Tobacco use, discussed stopping.  Current medicines are reviewed with the patient today.  The patient Has no concerns regarding medicines.  The following changes have been made:  See above Labs/ tests ordered today include:see above  Disposition:   FU:  see above  Signed, Cecilie Kicks, NP  11/24/2015 [facility] [place], [place], [place] Phone: [phone]; Fax: [phone]

## 2015-12-08 NOTE — Anesthesia Preprocedure Evaluation (Addendum)
Anesthesia Evaluation  Patient identified by MRN, date of birth, ID band Patient awake    Reviewed: Allergy & Precautions, NPO status , Patient's Chart, lab work & pertinent test results  Airway Mallampati: I  TM Distance: >3 FB Neck ROM: full    Dental  (+) Dental Advidsory Given, Upper Dentures, Lower Dentures   Pulmonary Current Smoker,    breath sounds clear to auscultation       Cardiovascular hypertension, On Medications + dysrhythmias Atrial Fibrillation  Rhythm:irregular Rate:Normal     Neuro/Psych    GI/Hepatic (+) Hepatitis -, C  Endo/Other  diabetes  Renal/GU      Musculoskeletal   Abdominal   Peds  Hematology   Anesthesia Other Findings - Left ventricle: The cavity size was normal. Systolic function was   normal. The estimated ejection fraction was in the range of 55%  to 60%. Wall motion was normal; there were no regional wall  motion abnormalities. The study was not technically sufficient to  allow evaluation of LV diastolic dysfunction due to atrial  fibrillation. - Aortic valve: Trileaflet; mildly thickened, mildly calcified   leaflets. Moderate focal calcification involving the noncoronary  cusp. - Aorta: Aortic root dimension: 39 mm (ED). Ascending aorta  diameter: 4.39 mm (ED). - Aortic root: The aortic root was mildly dilated. - Ascending aorta: The ascending aorta was mildly dilated. - Mitral valve: MIldly calcified chordae tendinae - Right ventricle: The cavity size was mildly dilated. Wall   thickness was normal.   Reproductive/Obstetrics                          Anesthesia Physical Anesthesia Plan  ASA: III  Anesthesia Plan: General   Post-op Pain Management:    Induction: Intravenous  Airway Management Planned: Mask  Additional Equipment:   Intra-op Plan:   Post-operative Plan:   Informed Consent: I have reviewed the patients History and Physical,  chart, labs and discussed the procedure including the risks, benefits and alternatives for the proposed anesthesia with the patient or authorized representative who has indicated his/her understanding and acceptance.   Dental Advisory Given  Plan Discussed with: Anesthesiologist, CRNA and Surgeon  Anesthesia Plan Comments:        Anesthesia Quick Evaluation

## 2015-12-26 ENCOUNTER — Encounter: Payer: Self-pay | Admitting: Nurse Practitioner

## 2015-12-26 ENCOUNTER — Ambulatory Visit (INDEPENDENT_AMBULATORY_CARE_PROVIDER_SITE_OTHER): Payer: Medicare Other | Admitting: Nurse Practitioner

## 2015-12-26 ENCOUNTER — Encounter (INDEPENDENT_AMBULATORY_CARE_PROVIDER_SITE_OTHER): Payer: Self-pay

## 2015-12-26 VITALS — BP 126/72 | HR 52 | Ht 70.0 in | Wt 238.8 lb

## 2015-12-26 DIAGNOSIS — I481 Persistent atrial fibrillation: Secondary | ICD-10-CM

## 2015-12-26 DIAGNOSIS — Z9889 Other specified postprocedural states: Secondary | ICD-10-CM

## 2015-12-26 DIAGNOSIS — I4819 Other persistent atrial fibrillation: Secondary | ICD-10-CM

## 2015-12-26 NOTE — Progress Notes (Signed)
CARDIOLOGY OFFICE NOTE  Date:  12/26/2015    Gregg Flores Date of Birth: 28-Jun-1945 Medical Record [id number]  PCP:  Harrison Mons, PA-C  Cardiologist:  Johnsie Cancel   Chief Complaint  Patient presents with  . Atrial Fibrillation    Follow up visit post cardioversion - seen for Dr. Johnsie Cancel    History of Present Illness: Gregg Flores is a 70 y.o. male who presents today for a follow up visit. Seen for Dr. Johnsie Cancel.   He has a history of AF, HTN, DM and elevated lipids. He had some upset stomach with Xarelto and stopped. CHA2DS2Vasc score at least 3.   Echo done with EF 55-60%, aortic root was mildly dilated. Normal LA size. No apparent known CAD noted.   Seen about a month ago by Cecilie Kicks, NP - placed on Eliquis. Noted to have poor insight into his issues. Looks like he proceeded on with TEE/cardioversion earlier this month with Dr. Aundra Dubin. NSR restored.   Comes in today. Here alone. He says he is doing ok. Hard to say if he was really symptomatic with his AF but says his breathing is "easier". Says he is taking his Eliquis - no missed doses. Asking when the marking from the cardioversion pad will go away. No chest pain. Not dizzy or lightheaded. Taking Mobic about once or twice a week.   Past Medical History:  Diagnosis Date  . BPH (benign prostatic hyperplasia)   . Cataracts, bilateral   . Chronic LBP   . Colon polyps   . Diverticulosis   . ED (erectile dysfunction)   . Essential hypertension, benign 1990  . Microalbuminuria   . Monoclonal gammopathy 04/2008  . Type II or unspecified type diabetes mellitus without mention of complication, not stated as uncontrolled     Past Surgical History:  Procedure Laterality Date  . CARDIOVERSION N/A 12/08/2015   Procedure: CARDIOVERSION;  Surgeon: Larey Dresser, MD;  Location: [facility] ENDOSCOPY;  Service: Cardiovascular;  Laterality: N/A;  . TEE WITHOUT CARDIOVERSION N/A 12/08/2015   Procedure: TRANSESOPHAGEAL  ECHOCARDIOGRAM (TEE);  Surgeon: Larey Dresser, MD;  Location: [place];  Service: Cardiovascular;  Laterality: N/A;  . TRANSURETHRAL RESECTION OF PROSTATE       Medications: Current Outpatient Prescriptions  Medication Sig Dispense Refill  . amLODipine (NORVASC) 10 MG tablet Take 10 mg by mouth daily.  0  . apixaban (ELIQUIS) 5 MG TABS tablet Take 1 tablet (5 mg total) by mouth 2 (two) times daily. 60 tablet 2  . benazepril (LOTENSIN) 40 MG tablet TAKE 1 TABLET BY MOUTH ONCE DAILY. 90 tablet 3  . hydrochlorothiazide (HYDRODIURIL) 25 MG tablet TAKE 1 TABLET BY MOUTH EVERY MORNING 90 tablet 3  . HYDROcodone-acetaminophen (NORCO) 7.5-325 MG tablet Take 1-2 tablets by mouth daily as needed. For pain 30 tablet 0  . ipratropium (ATROVENT) 0.03 % nasal spray Place 2 sprays into the nose every 12 (twelve) hours. 90 mL 3  . meloxicam (MOBIC) 15 MG tablet TAKE 1 TABLET BY MOUTH ONCE DAILY AS NEEDED FOR PAIN. DO  NOT USE WITH ALEVE 90 tablet 3  . metFORMIN (GLUCOPHAGE) 1000 MG tablet Take 1 tablet (1,000 mg total) by mouth 2 (two) times daily with a meal. 180 tablet 3  . metoprolol (LOPRESSOR) 50 MG tablet Take 1 tablet (50 mg total) by mouth 2 (two) times daily. 180 tablet 3  . pravastatin (PRAVACHOL) 80 MG tablet Take 1 tablet (80 mg total) by mouth daily. 90 tablet  3   No current facility-administered medications for this visit.     Allergies: No Known Allergies  Social History: The patient  reports that he has been smoking Cigarettes.  He has a 43.00 pack-year smoking history. He has never used smokeless tobacco. He reports that he does not drink alcohol or use drugs.   Family History: The patient's family history includes Diabetes in his brother; Heart disease in his mother; Hypertension in his mother.   Review of Systems: Please see the history of present illness.   Otherwise, the review of systems is positive for none.   All other systems are reviewed and negative.   Physical  Exam: VS:  BP 126/72   Pulse (!) 52   Ht 5\' 10"  (1.778 m)   Wt 238 lb 12.8 oz (108.3 kg)   BMI 34.26 kg/m  .  BMI Body mass index is 34.26 kg/m.  Wt Readings from Last 3 Encounters:  12/26/15 238 lb 12.8 oz (108.3 kg)  11/24/15 236 lb 12.8 oz (107.4 kg)  11/15/15 236 lb 9.6 oz (107.3 kg)    General: Pleasant. Obese black male who is alert and in no acute distress.   HEENT: Normal.  Neck: Supple, no JVD, carotid bruits, or masses noted.  Cardiac: Regular rate and rhythm. Heart tones are distant. No edema.  Respiratory:  Lungs are clear to auscultation bilaterally with normal work of breathing.  GI: Soft and nontender.  MS: No deformity or atrophy. Gait and ROM intact.  Skin: Warm and dry. Color is normal.  Neuro:  Strength and sensation are intact and no gross focal deficits noted.  Psych: Alert, appropriate and with normal affect.   LABORATORY DATA:  EKG:  EKG is ordered today. This demonstrates sinus bradycardia with PAC. Poor R wave progression.  Lab Results  Component Value Date   WBC 5.7 11/24/2015   HGB 15.4 11/24/2015   HCT 46.4 11/24/2015   PLT 283 11/24/2015   GLUCOSE 97 11/24/2015   CHOL 138 09/27/2015   TRIG 108 09/27/2015   HDL 27 (L) 09/27/2015   LDLCALC 89 09/27/2015   ALT 10 11/15/2015   AST 14 11/15/2015   NA 138 11/24/2015   K 4.2 11/24/2015   CL 103 11/24/2015   CREATININE 1.02 11/24/2015   BUN 16 11/24/2015   CO2 24 11/24/2015   INR 1.1 11/24/2015   HGBA1C 7.2 (H) 09/27/2015   MICROALBUR 7.4 11/16/2014    BNP (last 3 results) No results for input(s): BNP in the last 8760 hours.  ProBNP (last 3 results) No results for input(s): PROBNP in the last 8760 hours.   Other Studies Reviewed Today: Procedure: TEE  Indication: Atrial fibrillation  Sedation: Per anesthesiology.  Findings: Please see echo section for full report.  The patient was in rapid atrial fibrillation.  Normal LV size with moderate LV hypertrophy.  EF 45%, diffuse  hypokinesis.  Normal RV size and systolic function.  Mild left atrial enlargement, no LA appendage thrombus.  Normal right atrium.  Trileaflet aortic valve with no stenosis or regurgitation.  Trivial MR.  There appeared to be a loose chord attached to the anterior mitral leaflet.  However, there was only mild mitral regurgitation.  No PFO/ASD, negative bubble study.  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     May proceed to DCCV.  Would repeat echo in sinus rhythm to confirm EF.   Loralie Champagne 12/08/2015  ECHO  08/09/15 Study Conclusions  - Left ventricle: The cavity size was normal. Systolic function was normal. The estimated ejection fraction was in the range of 55% to 60%. Wall motion was normal; there were no regional wall motion abnormalities. The study was not technically sufficient to allow evaluation of LV diastolic dysfunction due to atrial fibrillation. - Aortic valve: Trileaflet; mildly thickened, mildly calcified leaflets. Moderate focal calcification involving the noncoronary cusp. - Aorta: Aortic root dimension: 39 mm (ED). Ascending aorta diameter: 4.39 mm (ED). - Aortic root: The aortic root was mildly dilated. - Ascending aorta: The ascending aorta was mildly dilated. - Mitral valve: MIldly calcified chordae tendinae - Right ventricle: The cavity size was mildly dilated. Wall thickness was normal.   Procedure: Electrical Cardioversion Indications:  Atrial Fibrillation  Procedure Details Consent: Risks of procedure as well as the alternatives and risks of each were explained to the (patient/caregiver).  Consent for procedure obtained. Time Out: Verified patient identification, verified procedure, site/side was marked, verified correct patient position, special equipment/implants available, medications/allergies/relevent history reviewed, required imaging and test results available.   Performed  Patient placed on cardiac monitor, pulse oximetry, supplemental oxygen as necessary.  Sedation given: Propofol per anesthesiology Pacer pads placed anterior and posterior chest.  Cardioverted 1 time(s).  Cardioverted at [place].  Evaluation Findings: Post procedure EKG shows: NSR Complications: None Patient did tolerate procedure well.   Loralie Champagne 12/08/2015, 12:35 PM  ASSESSMENT AND PLAN:  1. PAF - s/p cardioversion/TEE - holding in NSR. No change with his current regimen.   2. HTN: Well controlled. No change with current regimen.   3. HLD - on statin  4. DM - followed by PCP  5. Dilated ascending aorta - 4.2 on recent TEE - would favor imaging in one year to follow up.   6. Chronic anticoagulation - would not use Mobic regularly. Suggested Tylenol.    Current medicines are reviewed with the patient today.  The patient does not have concerns regarding medicines other than what has been noted above.  The following changes have been made:  See above.  Labs/ tests ordered today include:   No orders of the defined types were placed in this encounter.    Disposition:   FU with Dr. Johnsie Cancel in 3 months with EKG.   Patient is agreeable to this plan and will call if any problems develop in the interim.   Signed: Burtis Junes, RN, ANP-C 12/26/2015 8:27 AM  [facility] [address] [place], [place]  [address] Phone: [phone] Fax: [phone]

## 2015-12-26 NOTE — Patient Instructions (Addendum)
We will be checking the following labs today - NONE   Medication Instructions:    Continue with your current medicines.     Testing/Procedures To Be Arranged:  N/A  Follow-Up:   See Dr. Johnsie Cancel in 3 months with EKG    Other Special Instructions:   N/A    If you need a refill on your cardiac medications before your next appointment, please call your pharmacy.   Call the [place] office at [phone] if you have any questions, problems or concerns.     @

## 2016-01-03 ENCOUNTER — Ambulatory Visit (INDEPENDENT_AMBULATORY_CARE_PROVIDER_SITE_OTHER): Payer: Medicare Other | Admitting: Physician Assistant

## 2016-01-03 ENCOUNTER — Encounter: Payer: Self-pay | Admitting: Physician Assistant

## 2016-01-03 VITALS — BP 122/70 | HR 62 | Temp 98.5°F | Resp 18 | Ht 70.0 in | Wt 239.0 lb

## 2016-01-03 DIAGNOSIS — I10 Essential (primary) hypertension: Secondary | ICD-10-CM | POA: Diagnosis not present

## 2016-01-03 DIAGNOSIS — E1122 Type 2 diabetes mellitus with diabetic chronic kidney disease: Secondary | ICD-10-CM | POA: Diagnosis not present

## 2016-01-03 DIAGNOSIS — M545 Low back pain: Secondary | ICD-10-CM | POA: Diagnosis not present

## 2016-01-03 DIAGNOSIS — I4891 Unspecified atrial fibrillation: Secondary | ICD-10-CM

## 2016-01-03 DIAGNOSIS — E785 Hyperlipidemia, unspecified: Secondary | ICD-10-CM | POA: Diagnosis not present

## 2016-01-03 DIAGNOSIS — Z720 Tobacco use: Secondary | ICD-10-CM

## 2016-01-03 DIAGNOSIS — G8929 Other chronic pain: Secondary | ICD-10-CM | POA: Diagnosis not present

## 2016-01-03 DIAGNOSIS — N183 Chronic kidney disease, stage 3 (moderate): Secondary | ICD-10-CM | POA: Diagnosis not present

## 2016-01-03 MED ORDER — AMLODIPINE BESYLATE 10 MG PO TABS: 10.0000 mg | ORAL_TABLET | Freq: Every day | ORAL | 3 refills | Status: DC

## 2016-01-03 MED ORDER — DICLOFENAC SODIUM 1 % TD GEL: 2.0000 g | Freq: Every day | TRANSDERMAL | 0 refills | Status: DC | PRN

## 2016-01-03 MED ORDER — HYDROCODONE-ACETAMINOPHEN 7.5-325 MG PO TABS: 1.0000 | ORAL_TABLET | Freq: Two times a day (BID) | ORAL | 0 refills | Status: DC | PRN

## 2016-01-03 MED ORDER — PRAVASTATIN SODIUM 80 MG PO TABS: 80.0000 mg | ORAL_TABLET | Freq: Every day | ORAL | 3 refills | Status: DC

## 2016-01-03 MED ORDER — METOPROLOL TARTRATE 50 MG PO TABS: 50.0000 mg | ORAL_TABLET | Freq: Two times a day (BID) | ORAL | 3 refills | Status: DC

## 2016-01-03 MED ORDER — METFORMIN HCL 1000 MG PO TABS: 1000.0000 mg | ORAL_TABLET | Freq: Two times a day (BID) | ORAL | 3 refills | Status: DC

## 2016-01-03 MED ORDER — BENAZEPRIL HCL 40 MG PO TABS: ORAL_TABLET | ORAL | 3 refills | Status: DC

## 2016-01-03 MED ORDER — HYDROCHLOROTHIAZIDE 25 MG PO TABS: ORAL_TABLET | ORAL | 3 refills | Status: DC

## 2016-01-03 NOTE — Patient Instructions (Addendum)
STOP THE MELOXICAM. Let me know if you are needing to use the Voltaren gel more than 2 times a week. It's OK to use the hydrocodone for pain if you need it.  Bring your medications in next time and we can go through them together to make sure we both know what you are taking, when and what for.    IF you received an x-ray today, you will receive an invoice from [facility]. Please contact [facility] at [phone] with questions or concerns regarding your invoice.   IF you received labwork today, you will receive an invoice from [employer]. Please contact [facility] at [phone] with questions or concerns regarding your invoice.   Our billing staff will not be able to assist you with questions regarding bills from these companies.  You will be contacted with the lab results as soon as they are available. The fastest way to get your results is to activate your My Chart account. Instructions are located on the last page of this paperwork. If you have not heard from [place] regarding the results in 2 weeks, please contact this office.     f

## 2016-01-03 NOTE — Progress Notes (Signed)
Patient ID: Gregg Flores, male    DOB: 1945-08-07, 70 y.o.   MRN: [id number]  PCP: Harrison Mons, PA-C  Chief Complaint  Patient presents with  . Follow-up    3 month diabetes    Subjective:   Presents for evaluation of diabetes.  Since his last visit with me he has seen cardiology for new Afib, was cardioverted and is now on Eliquis, which he tolerates well.  Sees eye specialist in January. Podiatrist in January.  Continues to have arthritis pain. Requests Voltaren gel, as he's not to use oral NSAIDS, meloxicam since starting Eliquis.   Review of Systems  Constitutional: Negative for activity change, appetite change, fatigue and unexpected weight change.  HENT: Negative for congestion, dental problem, ear pain, hearing loss, mouth sores, postnasal drip, rhinorrhea, sneezing, sore throat, tinnitus and trouble swallowing.   Eyes: Negative for photophobia, pain, redness and visual disturbance.  Respiratory: Negative for cough, chest tightness and shortness of breath.   Cardiovascular: Negative for chest pain, palpitations and leg swelling.  Gastrointestinal: Negative for abdominal pain, blood in stool, constipation, diarrhea, nausea and vomiting.  Genitourinary: Negative for dysuria, frequency, hematuria and urgency.  Musculoskeletal: Positive for arthralgias (knees) and back pain. Negative for gait problem, myalgias and neck stiffness.  Skin: Negative for rash.  Neurological: Negative for dizziness, speech difficulty, weakness, light-headedness, numbness and headaches.  Hematological: Negative for adenopathy.  Psychiatric/Behavioral: Negative for confusion and sleep disturbance. The patient is not nervous/anxious.        Patient Active Problem List   Diagnosis Date Noted  . BMI 35.0-35.9,adult 05/18/2014  . History of hepatitis C 06/15/2012  . Hyperlipidemia LDL goal <70 04/06/2012  . Tobacco use 09/25/2011  . Essential hypertension, benign   . Chronic low back  pain   . Type 2 diabetes mellitus with renal manifestations ([place])   . Diverticulosis   . Benign prostatic hyperplasia with urinary obstruction   . Monoclonal gammopathy   . Cataracts, bilateral   . Erectile dysfunction due to arterial insufficiency   . Microalbuminuria   . Benign neoplasm of colon 07/01/2008     Prior to Admission medications   Medication Sig Start Date End Date Taking? Authorizing Provider  amLODipine (NORVASC) 10 MG tablet Take 10 mg by mouth daily. 08/09/15  Yes Historical Provider, MD  apixaban (ELIQUIS) 5 MG TABS tablet Take 1 tablet (5 mg total) by mouth 2 (two) times daily. 11/24/15  Yes Isaiah Serge, NP  benazepril (LOTENSIN) 40 MG tablet TAKE 1 TABLET BY MOUTH ONCE DAILY. 02/08/15  Yes Kaulder Zahner, PA-C  hydrochlorothiazide (HYDRODIURIL) 25 MG tablet TAKE 1 TABLET BY MOUTH EVERY MORNING 02/08/15  Yes Dauntae Derusha, PA-C  HYDROcodone-acetaminophen (NORCO) 7.5-325 MG tablet Take 1 tablet by mouth 2 (two) times daily as needed. For pain 01/03/16  Yes Eh Sauseda, PA-C  ipratropium (ATROVENT) 0.03 % nasal spray Place 2 sprays into the nose every 12 (twelve) hours. 05/18/14  Yes Icess Bertoni, PA-C  metFORMIN (GLUCOPHAGE) 1000 MG tablet Take 1 tablet (1,000 mg total) by mouth 2 (two) times daily with a meal. 06/14/15  Yes Aahna Rossa, PA-C  metoprolol (LOPRESSOR) 50 MG tablet Take 1 tablet (50 mg total) by mouth 2 (two) times daily. 06/14/15  Yes Emmalynn Pinkham, PA-C  pravastatin (PRAVACHOL) 80 MG tablet Take 1 tablet (80 mg total) by mouth daily. 03/01/15  Yes Herley Bernardini, PA-C  diclofenac sodium (VOLTAREN) 1 % GEL Apply 2 g topically daily as needed. Do not use more  than 2 times each week. 01/03/16   Ahmari Garton, PA-C     No Known Allergies     Objective:  Physical Exam  Constitutional: He is oriented to person, place, and time. He appears well-developed and well-nourished. He is active and cooperative. No distress.  BP 122/70 (BP Location: Left Arm,  Patient Position: Sitting, Cuff Size: Large)   Pulse 62   Temp 98.5 F (36.9 C) (Oral)   Resp 18   Ht 5\' 10"  (1.778 m)   Wt 239 lb (108.4 kg)   SpO2 98%   BMI 34.29 kg/m   HENT:  Head: Normocephalic and atraumatic.  Right Ear: Hearing normal.  Left Ear: Hearing normal.  Eyes: Conjunctivae are normal. No scleral icterus.  Neck: Normal range of motion. Neck supple. No thyromegaly present.  Cardiovascular: Normal rate, regular rhythm and normal heart sounds.   Pulses:      Radial pulses are 2+ on the right side, and 2+ on the left side.  Pulmonary/Chest: Effort normal and breath sounds normal.  Musculoskeletal:       Cervical back: Normal.       Thoracic back: Normal.       Lumbar back: Normal.  Lymphadenopathy:       Head (right side): No tonsillar, no preauricular, no posterior auricular and no occipital adenopathy present.       Head (left side): No tonsillar, no preauricular, no posterior auricular and no occipital adenopathy present.    He has no cervical adenopathy.       Right: No supraclavicular adenopathy present.       Left: No supraclavicular adenopathy present.  Neurological: He is alert and oriented to person, place, and time. No sensory deficit.  Skin: Skin is warm, dry and intact. No rash noted. No cyanosis or erythema. Nails show no clubbing.  Psychiatric: He has a normal mood and affect. His speech is normal and behavior is normal.           Assessment & Plan:   1. Type 2 diabetes mellitus with stage 3 chronic kidney disease, without long-term current use of insulin ([place]) Await labs. Adjust regimen as indicated by results. - Comprehensive metabolic panel - Hemoglobin A1c - Microalbumin, urine - metFORMIN (GLUCOPHAGE) 1000 MG tablet; Take 1 tablet (1,000 mg total) by mouth 2 (two) times daily with a meal.  Dispense: 180 tablet; Refill: 3  2. Essential hypertension, benign Controlled. Continue current treatment. - CBC with Differential/Platelet -  Comprehensive metabolic panel - hydrochlorothiazide (HYDRODIURIL) 25 MG tablet; TAKE 1 TABLET BY MOUTH EVERY MORNING  Dispense: 90 tablet; Refill: 3 - benazepril (LOTENSIN) 40 MG tablet; TAKE 1 TABLET BY MOUTH ONCE DAILY.  Dispense: 90 tablet; Refill: 3 - amLODipine (NORVASC) 10 MG tablet; Take 1 tablet (10 mg total) by mouth daily.  Dispense: 90 tablet; Refill: 3  3. Hyperlipidemia LDL goal <70 Await labs. Adjust regimen as indicated by results. - Comprehensive metabolic panel - Lipid panel - pravastatin (PRAVACHOL) 80 MG tablet; Take 1 tablet (80 mg total) by mouth daily.  Dispense: 90 tablet; Refill: 3  4. Chronic low back pain without sciatica, unspecified back pain laterality We discussed the risks associated with NSAIDS while on Eliquis. I agree to prescribe Voltaren gel, with the understanding that he will not use it more than twice a week. If he is needing it more often, would schedule acetaminophen. - diclofenac sodium (VOLTAREN) 1 % GEL; Apply 2 g topically daily as needed. Do not use more than  2 times each week.  Dispense: 100 g; Refill: 0 - HYDROcodone-acetaminophen (NORCO) 7.5-325 MG tablet; Take 1 tablet by mouth 2 (two) times daily as needed. For pain  Dispense: 60 tablet; Refill: 0  5. Tobacco use Encouraged to continue working on smoking cessation.  6. Atrial fibrillation, unspecified type ([facility]) Regular rhythm today. Continue with beta blocker and Eliquis, and follow-up with cardiology as planned. - metoprolol (LOPRESSOR) 50 MG tablet; Take 1 tablet (50 mg total) by mouth 2 (two) times daily.  Dispense: 180 tablet; Refill: 3     Fara Chute, PA-C Physician Assistant-Certified [facility]

## 2016-01-04 LAB — COMPREHENSIVE METABOLIC PANEL
ALT: 12 IU/L (ref 0–44)
AST: 16 IU/L (ref 0–40)
Albumin/Globulin Ratio: 1.3 (ref 1.2–2.2)
Albumin: 4.4 g/dL (ref 3.5–4.8)
Alkaline Phosphatase: 76 IU/L (ref 39–117)
BUN/Creatinine Ratio: 21 (ref 10–24)
BUN: 18 mg/dL (ref 8–27)
Bilirubin Total: 0.3 mg/dL (ref 0.0–1.2)
CALCIUM: 9.8 mg/dL (ref 8.6–10.2)
CO2: 24 mmol/L (ref 18–29)
CREATININE: 0.84 mg/dL (ref 0.76–1.27)
Chloride: 101 mmol/L (ref 96–106)
GFR, EST AFRICAN AMERICAN: 102 mL/min/{1.73_m2} (ref >59)
GFR, EST NON AFRICAN AMERICAN: 88 mL/min/{1.73_m2} (ref >59)
Globulin, Total: 3.4 g/dL (ref 1.5–4.5)
Glucose: 107 mg/dL — ABNORMAL HIGH (ref 65–99)
Potassium: 4.6 mmol/L (ref 3.5–5.2)
Sodium: 141 mmol/L (ref 134–144)
TOTAL PROTEIN: 7.8 g/dL (ref 6.0–8.5)

## 2016-01-04 LAB — HEMOGLOBIN A1C
ESTIMATED AVERAGE GLUCOSE: 160 mg/dL
Hgb A1c MFr Bld: 7.2 % — ABNORMAL HIGH (ref 4.8–5.6)

## 2016-01-04 LAB — CBC WITH DIFFERENTIAL/PLATELET
BASOS: 1 %
Basophils Absolute: 0 10*3/uL (ref 0.0–0.2)
EOS (ABSOLUTE): 0 10*3/uL (ref 0.0–0.4)
EOS: 1 %
HEMATOCRIT: 44 % (ref 37.5–51.0)
Hemoglobin: 14.2 g/dL (ref 13.0–17.7)
IMMATURE GRANS (ABS): 0 10*3/uL (ref 0.0–0.1)
IMMATURE GRANULOCYTES: 0 %
LYMPHS: 43 %
Lymphocytes Absolute: 2.2 10*3/uL (ref 0.7–3.1)
MCH: 27.9 pg (ref 26.6–33.0)
MCHC: 32.3 g/dL (ref 31.5–35.7)
MCV: 86 fL (ref 79–97)
MONOS ABS: 0.4 10*3/uL (ref 0.1–0.9)
Monocytes: 8 %
NEUTROS PCT: 47 %
Neutrophils Absolute: 2.5 10*3/uL (ref 1.4–7.0)
PLATELETS: 225 10*3/uL (ref 150–379)
RBC: 5.09 x10E6/uL (ref 4.14–5.80)
RDW: 14.2 % (ref 12.3–15.4)
WBC: 5.2 10*3/uL (ref 3.4–10.8)

## 2016-01-04 LAB — LIPID PANEL
Chol/HDL Ratio: 5.4 ratio units — ABNORMAL HIGH (ref 0.0–5.0)
Cholesterol, Total: 140 mg/dL (ref 100–199)
HDL: 26 mg/dL — ABNORMAL LOW (ref >39)
LDL CALC: 87 mg/dL (ref 0–99)
Triglycerides: 135 mg/dL (ref 0–149)
VLDL CHOLESTEROL CAL: 27 mg/dL (ref 5–40)

## 2016-01-04 LAB — MICROALBUMIN, URINE: MICROALBUM., U, RANDOM: 108.3 ug/mL

## 2016-01-07 ENCOUNTER — Encounter: Payer: Self-pay | Admitting: Physician Assistant

## 2016-01-13 ENCOUNTER — Telehealth: Payer: Self-pay

## 2016-01-13 NOTE — Telephone Encounter (Signed)
Chelle, diclofenac gel needs a PA done. The problem is that it will not be covered for pain or OA of back (or just FYI, neck, shoulder or hips). PAs are only approved if Rxd for Dx of pain from OA of elbows to fingers, or knee to feet. Do you know if pt has OA in any of these areas? I didn't see any notes about it but didn't want to send the PA in for OA of back and have it denied if I could use one of these other Dxs for the PA.

## 2016-01-14 NOTE — Telephone Encounter (Signed)
Someone else prescribed it for him, and I agreed to refill it. I think it was originally for his foot or knee. He'll know if you call him.

## 2016-01-17 NOTE — Telephone Encounter (Signed)
Tried to call pt. LMOM for him to CB to tell me if he has any arthritis in his knee, feet, or elbows, hands or fingers?

## 2016-01-18 NOTE — Telephone Encounter (Signed)
Pt had tried to call me back and LMOM. I tried him again and had to leave another message. Advised him the info I need from him so that he can leave me detailed message when he calls back.

## 2016-01-20 NOTE — Telephone Encounter (Signed)
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 He also uses hydrocodone when necessary. He has tried all OTC creams and patches without much help with pain. Completed covermymeds. Pending.

## 2016-01-29 NOTE — Progress Notes (Signed)
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 Lab Results  Component Value Date   TOTALPROTELP 7.2 11/12/2014   ALBUMINELP 4.1 11/12/2014   A1GS 0.3 11/12/2014   A2GS 0.9 11/12/2014   BETS 0.5 11/12/2014   BETA2SER 0.3 11/12/2014   GAMS 1.3 11/12/2014   MSPIKE Not Observed 11/15/2015   SPEI * 11/12/2014   (this displays SPEP labs)  Lab Results  Component Value Date   KPAFRELGTCHN 3.16 (H) 11/12/2014   LAMBDASER 2.65 (H) 11/12/2014   KAPLAMBRATIO 1.19 11/12/2014   (kappa/lambda light chains)    Current treatment: Observation  INTERVAL HISTORY: Patient is here for his one-year scheduled follow-up for MGUS. He notes no acute new symptoms. Labs today show no anemia. No fatigue. No constitutional symptoms. No new bone pains. Kidney function is stable. His SPEP from today shows no M spike.[facility]  REVIEW OF SYSTEMS:    10 Point review of systems of done and is negative except as noted above.  . Past Medical History:  Diagnosis Date  . BPH (benign prostatic hyperplasia)   . Cataracts, bilateral   . Chronic LBP   . Colon polyps   . Diverticulosis   . ED (erectile dysfunction)   . Essential hypertension, benign 1990  . Microalbuminuria   . Monoclonal gammopathy 04/2008  . Type II or unspecified type diabetes mellitus without mention of complication, not stated as uncontrolled    hepatitis C  . Past Surgical History:  Procedure Laterality Date  . CARDIOVERSION N/A 12/08/2015   Procedure: CARDIOVERSION;  Surgeon: Larey Dresser, MD;  Location: [facility] ENDOSCOPY;  Service: Cardiovascular;  Laterality: N/A;  . TEE WITHOUT CARDIOVERSION N/A 12/08/2015   Procedure: TRANSESOPHAGEAL ECHOCARDIOGRAM (TEE);  Surgeon: Larey Dresser, MD;  Location: [place] [facility];  Service: Cardiovascular;  Laterality: N/A;  . TRANSURETHRAL RESECTION OF PROSTATE      . Social History  Substance Use Topics  . Smoking status: Current Every Day Smoker    Packs/day: 1.00    Years: 43.00    Types: Cigarettes  . Smokeless tobacco: Never Used     Comment: has cut back to 0.5 ppd (09/27/15) using patches  . Alcohol use No     Comment: occasionally, wine and liquour     ALLERGIES:  has No Known Allergies.  MEDICATIONS:  Current Outpatient Prescriptions  Medication Sig Dispense Refill  . amLODipine (NORVASC) 10 MG tablet Take 1 tablet (10 mg total) by mouth daily. 90 tablet 3  . apixaban (ELIQUIS) 5 MG TABS tablet Take 1 tablet (5 mg total) by mouth 2 (two) times daily. 60 tablet 2  . benazepril (LOTENSIN) 40 MG tablet TAKE 1 TABLET BY MOUTH ONCE DAILY. 90 tablet 3  . diclofenac sodium (VOLTAREN) 1 % GEL Apply 2 g topically daily as needed. Do not use more than 2 times each week. 100 g 0  . hydrochlorothiazide (HYDRODIURIL) 25 MG tablet TAKE 1 TABLET BY MOUTH EVERY MORNING 90 tablet 3  . HYDROcodone-acetaminophen (NORCO) 7.5-325 MG tablet Take 1 tablet  by mouth 2 (two) times daily as needed. For pain 60 tablet 0  . ipratropium (ATROVENT) 0.03 % nasal spray Place 2 sprays into the nose every 12 (twelve) hours. 90 mL 3  . metFORMIN (GLUCOPHAGE) 1000 MG tablet Take 1 tablet (1,000 mg total) by mouth 2 (two) times daily with a meal. 180 tablet 3  . metoprolol (LOPRESSOR) 50 MG tablet Take 1 tablet (50 mg total) by mouth 2 (two) times daily. 180 tablet 3  . pravastatin (PRAVACHOL) 80 MG tablet Take 1  tablet (80 mg total) by mouth daily. 90 tablet 3   No current facility-administered medications for this visit.     PHYSICAL EXAMINATION: ECOG PERFORMANCE STATUS: 1 - Symptomatic but completely ambulatory  Vitals:   11/15/15 0826  BP: (!) 133/94  Pulse: 63  Resp: 18  Temp: 98.1 F (36.7 C)   Filed Weights   11/15/15 0826  Weight: 236 lb 9.6 oz (107.3 kg)   .Body mass index is 33 kg/m.  GENERAL:alert, in no acute distress and comfortable SKIN: skin color, texture, turgor are normal, no rashes or significant lesions EYES: normal, conjunctiva are pink and non-injected, sclera clear OROPHARYNX:no exudate, no erythema and lips, buccal mucosa, and tongue normal  NECK: supple, no JVD, thyroid normal size, non-tender, without nodularity LYMPH:  no palpable lymphadenopathy in the cervical, axillary or inguinal LUNGS: clear to auscultation with normal respiratory effort HEART: regular rate & rhythm,  no murmurs and no lower extremity edema ABDOMEN: abdomen soft, non-tender, normoactive bowel sounds  Musculoskeletal: no cyanosis of digits and no clubbing  PSYCH: alert & oriented x 3 with fluent speech NEURO: no focal motor/sensory deficits  LABORATORY DATA:   I have reviewed the data as listed  . CBC Latest Ref Rng & Units 11/15/2015  WBC 3.4 - 10.8 x10E3/uL 5.5  Hemoglobin 13.2 - 17.1 g/dL 14.4  Hematocrit 37.5 - 51.0 % 43.0  Platelets 150 - 379 x10E3/uL 254    . CMP Latest Ref Rng & Units 11/15/2015  Glucose 65 - 99 mg/dL 164(H)  BUN 8 - 27 mg/dL 16.7  Creatinine 0.76 - 1.27 mg/dL 1.0  Sodium 134 - 144 mmol/L 138  Potassium 3.5 - 5.2 mmol/L 4.2  Chloride 96 - 106 mmol/L -  CO2 18 - 29 mmol/L 19(L)  Calcium 8.6 - 10.2 mg/dL 9.5  Total Protein 6.0 - 8.5 g/dL 8.0  Total Bilirubin 0.0 - 1.2 mg/dL 0.30  Alkaline Phos 39 - 117 IU/L 86  AST 0 - 40 IU/L 14  ALT 0 - 44 IU/L 10       RADIOGRAPHIC STUDIES: I have personally reviewed the radiological images as listed  and agreed with the findings in the report. No results found.  ASSESSMENT & PLAN:   70 year old African-American gentleman with hepatitis C with  #1 MGUS - last SPEP showed some restricted mobility in the IgG lambda and IgG kappa lanes no overtly measurable monoclonal protein. This could very well be due to his hepatitis C. No anemia, renal failure, hypercalcemia or bone pains. SPEP on 11/15/2015 shows no overt M -spike. Plan -no indication for additional workup for a plasma cell neoplasm at this time in the absence of clinical/lab concerns and no M spike.  #2 hepatitis C status post treatment. Viral load undetectable on 05/18/2014. Plan -Continue follow-up with primary care physician and hepatitis C clinic. -Continue hepatocellular carcinoma screening with ultrasound of abdomen and AFP tumor marker every 6 months with primary care physician.  No specific followup  indicated with [place] currently Continue f/u with PCP  I spent 15 minutes counseling the patient face to face. The total time spent in the appointment was 15 minutes and more than 50% was on counseling and direct patient cares.    Sullivan Lone MD [place] AAHIVMS [facility] Hematology/Oncology Physician [facility]  (Office):       [phone] (Work cell):  [phone] (Fax):           [phone]

## 2016-02-03 DIAGNOSIS — H04123 Dry eye syndrome of bilateral lacrimal glands: Secondary | ICD-10-CM | POA: Diagnosis not present

## 2016-02-03 DIAGNOSIS — H2513 Age-related nuclear cataract, bilateral: Secondary | ICD-10-CM | POA: Diagnosis not present

## 2016-02-03 DIAGNOSIS — E119 Type 2 diabetes mellitus without complications: Secondary | ICD-10-CM | POA: Diagnosis not present

## 2016-02-03 DIAGNOSIS — E113291 Type 2 diabetes mellitus with mild nonproliferative diabetic retinopathy without macular edema, right eye: Secondary | ICD-10-CM | POA: Diagnosis not present

## 2016-02-03 LAB — HM DIABETES EYE EXAM

## 2016-02-06 ENCOUNTER — Ambulatory Visit (INDEPENDENT_AMBULATORY_CARE_PROVIDER_SITE_OTHER): Payer: Medicare Other | Admitting: *Deleted

## 2016-02-06 DIAGNOSIS — I4891 Unspecified atrial fibrillation: Secondary | ICD-10-CM | POA: Diagnosis not present

## 2016-02-06 MED ORDER — APIXABAN 5 MG PO TABS: 5.0000 mg | ORAL_TABLET | Freq: Two times a day (BID) | ORAL | 5 refills | Status: DC

## 2016-02-06 NOTE — Progress Notes (Signed)
Pt was started on Eliquis for Atrial Fib on 11/03/2015.    Reviewed patients medication list.  Pt is not currently  on any combined P-gp and strong CYP3A4 inhibitors/inducers (ketoconazole, traconazole, ritonavir, carbamazepine, phenytoin, rifampin, St. John's wort).  Reviewed labs.  SCr  0.85 , Weight 111.41kg.  Dose is appropriate based on age, weight, and SCr.  Hgb  13.8 and HCT 40.5  A full discussion of the nature of anticoagulants has been carried out.  A benefit/risk analysis has been presented to the patient, so that they understand the justification for choosing anticoagulation with Eliquis at this time.  The need for compliance is stressed.  Pt is aware to take the medication twice daily.  Side effects of potential bleeding are discussed, including unusual colored urine or stools, coughing up blood or coffee ground emesis, nose bleeds or serious fall or head trauma.  Discussed signs and symptoms of stroke. The patient should avoid any OTC items containing aspirin or ibuprofen.  Avoid alcohol consumption.   Call if any signs of abnormal bleeding.  Discussed financial obligations and pt states he is not having any difficulty in obtaining medication.  Next lab test test in months.  Pt states he has had no sign or symptom of bleeding and has had not sign or symptom of stroke States has not missed any doses of Eliquis Will get CBC and BMET today and will call with results Pt was on Xarelto but states upset stomach so started on Eliquis in October of 2017  Had cardioversion 12/08/2015  02/08/2016 Spoke with pt and instructed that he  Is on correct dose of Eliquis 5mg  bid and to continue same dose and will recheck cbc and bmet in 3 months and pt states understanding

## 2016-02-07 LAB — BASIC METABOLIC PANEL
BUN / CREAT RATIO: 15 (ref 10–24)
BUN: 13 mg/dL (ref 8–27)
CO2: 23 mmol/L (ref 18–29)
Calcium: 9.8 mg/dL (ref 8.6–10.2)
Chloride: 98 mmol/L (ref 96–106)
Creatinine, Ser: 0.85 mg/dL (ref 0.76–1.27)
GFR calc Af Amer: 101 mL/min/{1.73_m2} (ref >59)
GFR, EST NON AFRICAN AMERICAN: 88 mL/min/{1.73_m2} (ref >59)
GLUCOSE: 127 mg/dL — AB (ref 65–99)
POTASSIUM: 4.5 mmol/L (ref 3.5–5.2)
SODIUM: 139 mmol/L (ref 134–144)

## 2016-02-07 LAB — CBC
Hematocrit: 40.5 % (ref 37.5–51.0)
Hemoglobin: 13.8 g/dL (ref 13.0–17.7)
MCH: 28.2 pg (ref 26.6–33.0)
MCHC: 34.1 g/dL (ref 31.5–35.7)
MCV: 83 fL (ref 79–97)
Platelets: 233 10*3/uL (ref 150–379)
RBC: 4.9 x10E6/uL (ref 4.14–5.80)
RDW: 13.2 % (ref 12.3–15.4)
WBC: 5.6 10*3/uL (ref 3.4–10.8)

## 2016-02-08 ENCOUNTER — Ambulatory Visit (INDEPENDENT_AMBULATORY_CARE_PROVIDER_SITE_OTHER): Payer: Medicare Other | Admitting: Podiatry

## 2016-02-08 ENCOUNTER — Encounter: Payer: Self-pay | Admitting: Podiatry

## 2016-02-08 VITALS — BP 136/66 | HR 53 | Resp 18

## 2016-02-08 DIAGNOSIS — M79676 Pain in unspecified toe(s): Secondary | ICD-10-CM

## 2016-02-08 DIAGNOSIS — B351 Tinea unguium: Secondary | ICD-10-CM

## 2016-02-08 DIAGNOSIS — E119 Type 2 diabetes mellitus without complications: Secondary | ICD-10-CM

## 2016-02-08 NOTE — Patient Instructions (Signed)

## 2016-02-08 NOTE — Progress Notes (Signed)
Patient ID: Gregg Flores, male   DOB: 05-21-1945, 71 y.o.   MRN: [id number]    Subjective: This patient presents for scheduled visit complaining of elongated and thickened toenails which are uncomfortable when he wears shoes and walks and is requesting toenail debridement  Objective: Orientated 3 DP and PT pulses 2/4 bilaterally Capillary reflex immediate bilaterally Sensation to 10 g monofilament wire intact 5/5 bilaterally Vibratory sensation reactive bilaterally Ankle reflex equal and reactive bilaterally Callus medial nail groove left hallux No open skin lesions bilaterally The toenails are hypertrophic, elongated, discolored, incurvated and tender to direct palpation 6-10  Assessment: Symptomatic onychomycoses 6-10 Diabetic without complications  Plan: Debridement toenails 6-10 mechanically and electronically without any bleeding Apply Vaseline to the left hallux medial nail groove daily and cover with a Band-Aid until the callused softens  Reappoint 3 months

## 2016-02-20 ENCOUNTER — Other Ambulatory Visit: Payer: Self-pay | Admitting: Cardiology

## 2016-03-08 ENCOUNTER — Telehealth: Payer: Self-pay

## 2016-03-08 NOTE — Telephone Encounter (Signed)
Called and left message for patient to call back to reschedule his 04/10/16 appointment with Chelle since she is going to be out of the office that day. Anyone can make him an appointment on her next available day.  Thank you!

## 2016-03-16 NOTE — Progress Notes (Signed)
CARDIOLOGY OFFICE NOTE  Date:  03/28/2016    Gregg Flores Date of Birth: 12/03/1945 Medical Record [id number]  PCP:  Harrison Mons, PA-C  Cardiologist:  Johnsie Cancel   Chief Complaint  Patient presents with  . Atrial Fibrillation    History of Present Illness: Gregg Flores is a 71 y.o. male who presents today for a follow up visit.    He has a history of AF, HTN, DM and elevated lipids. He had some upset stomach with Xarelto and stopped. CHA2DS2Vasc score at least 3. Echo done with EF 55-60%, aortic root was mildly dilated. Normal LA size. No apparent known CAD noted.  Had TEE/DCC November 2017  Comes in today. Here alone. He says he is doing ok. Hard to say if he was really symptomatic with his AF but says his breathing is "easier". Says he is taking his Eliquis - no missed doses.   Past Medical History:  Diagnosis Date  . BPH (benign prostatic hyperplasia)   . Cataracts, bilateral   . Chronic LBP   . Colon polyps   . Diverticulosis   . ED (erectile dysfunction)   . Essential hypertension, benign 1990  . Microalbuminuria   . Monoclonal gammopathy 04/2008  . Type II or unspecified type diabetes mellitus without mention of complication, not stated as uncontrolled     Past Surgical History:  Procedure Laterality Date  . CARDIOVERSION N/A 12/08/2015   Procedure: CARDIOVERSION;  Surgeon: Larey Dresser, MD;  Location: [facility] ENDOSCOPY;  Service: Cardiovascular;  Laterality: N/A;  . TEE WITHOUT CARDIOVERSION N/A 12/08/2015   Procedure: TRANSESOPHAGEAL ECHOCARDIOGRAM (TEE);  Surgeon: Larey Dresser, MD;  Location: [facility];  Service: Cardiovascular;  Laterality: N/A;  . TRANSURETHRAL RESECTION OF PROSTATE       Medications: Current Outpatient Prescriptions  Medication Sig Dispense Refill  . amLODipine (NORVASC) 10 MG tablet Take 1 tablet (10 mg total) by mouth daily. 90 tablet 3  . apixaban (ELIQUIS) 5 MG TABS tablet Take 1 tablet (5 mg total) by mouth 2  (two) times daily. 60 tablet 5  . benazepril (LOTENSIN) 40 MG tablet TAKE 1 TABLET BY MOUTH ONCE DAILY. 90 tablet 3  . diclofenac sodium (VOLTAREN) 1 % GEL Apply 2 g topically daily as needed. Do not use more than 2 times each week. 100 g 0  . hydrochlorothiazide (HYDRODIURIL) 25 MG tablet TAKE 1 TABLET BY MOUTH EVERY MORNING 90 tablet 3  . HYDROcodone-acetaminophen (NORCO) 7.5-325 MG tablet Take 1 tablet by mouth 2 (two) times daily as needed. For pain 60 tablet 0  . ipratropium (ATROVENT) 0.03 % nasal spray Place 2 sprays into the nose every 12 (twelve) hours. 90 mL 3  . metFORMIN (GLUCOPHAGE) 1000 MG tablet Take 1 tablet (1,000 mg total) by mouth 2 (two) times daily with a meal. 180 tablet 3  . metoprolol (LOPRESSOR) 50 MG tablet Take 1 tablet (50 mg total) by mouth 2 (two) times daily. 180 tablet 3  . pravastatin (PRAVACHOL) 80 MG tablet Take 1 tablet (80 mg total) by mouth daily. 90 tablet 3   No current facility-administered medications for this visit.     Allergies: No Known Allergies  Social History: The patient  reports that he has been smoking Cigarettes.  He has a 43.00 pack-year smoking history. He has never used smokeless tobacco. He reports that he does not drink alcohol or use drugs.   Family History: The patient's family history includes Diabetes in his brother; Heart disease  in his mother; Hypertension in his mother.   Review of Systems: Please see the history of present illness.   Otherwise, the review of systems is positive for none.   All other systems are reviewed and negative.   Physical Exam: VS:  BP 122/80   Pulse (!) 56   Ht 5\' 9"  (1.753 m)   Wt 251 lb 12.8 oz (114.2 kg)   SpO2 97%   BMI 37.18 kg/m  .  BMI Body mass index is 37.18 kg/m.  Wt Readings from Last 3 Encounters:  03/28/16 251 lb 12.8 oz (114.2 kg)  01/03/16 239 lb (108.4 kg)  12/26/15 238 lb 12.8 oz (108.3 kg)    General: Pleasant. Obese black male who is alert and in no acute distress.     HEENT: Normal.  Neck: Supple, no JVD, carotid bruits, or masses noted.  Cardiac: Regular rate and rhythm. Heart tones are distant. No edema.  Respiratory:  Lungs are clear to auscultation bilaterally with normal work of breathing.  GI: Soft and nontender.  MS: No deformity or atrophy. Gait and ROM intact.  Skin: Warm and dry. Color is normal.  Neuro:  Strength and sensation are intact and no gross focal deficits noted.  Psych: Alert, appropriate and with normal affect.   LABORATORY DATA:  EKG:  EKG is ordered today. This demonstrates sinus bradycardia with PAC. Poor R wave progression.  Lab Results  Component Value Date   WBC 5.6 02/06/2016   HGB 15.4 11/24/2015   HCT 40.5 02/06/2016   PLT 233 02/06/2016   GLUCOSE 127 (H) 02/06/2016   CHOL 140 01/03/2016   TRIG 135 01/03/2016   HDL 26 (L) 01/03/2016   LDLCALC 87 01/03/2016   ALT 12 01/03/2016   AST 16 01/03/2016   NA 139 02/06/2016   K 4.5 02/06/2016   CL 98 02/06/2016   CREATININE 0.85 02/06/2016   BUN 13 02/06/2016   CO2 23 02/06/2016   INR 1.1 11/24/2015   HGBA1C 7.2 (H) 01/03/2016   MICROALBUR 7.4 11/16/2014    BNP (last 3 results) No results for input(s): BNP in the last 8760 hours.  ProBNP (last 3 results) No results for input(s): PROBNP in the last 8760 hours.   Other Studies Reviewed Today: Procedure: TEE  Indication: Atrial fibrillation  Sedation: Per anesthesiology.  Findings: Please see echo section for full report.  The patient was in rapid atrial fibrillation.  Normal LV size with moderate LV hypertrophy.  EF 45%, diffuse hypokinesis.  Normal RV size and systolic function.  Mild left atrial enlargement, no LA appendage thrombus.  Normal right atrium.  Trileaflet aortic valve with no stenosis or regurgitation.  Trivial MR.  There appeared to be a loose chord attached to the anterior mitral leaflet.  However, there was only mild mitral regurgitation.  No PFO/ASD, negative bubble study.  Ascending  aorta mildly dilated to 4.2 cm.  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     May proceed to DCCV.  Would repeat echo in sinus rhythm to confirm EF.   Loralie Champagne 12/08/2015  ECHO 08/09/15 Study Conclusions  - Left ventricle: The cavity size was normal. Systolic function was normal. The estimated ejection fraction was in the range of 55% to 60%. Wall motion was normal; there were no regional wall motion abnormalities. The study was not technically sufficient to allow evaluation of LV diastolic dysfunction due to atrial fibrillation. - Aortic valve: Trileaflet; mildly thickened, mildly calcified leaflets. Moderate focal  calcification involving the noncoronary cusp. - Aorta: Aortic root dimension: 39 mm (ED). Ascending aorta diameter: 4.39 mm (ED). - Aortic root: The aortic root was mildly dilated. - Ascending aorta: The ascending aorta was mildly dilated. - Mitral valve: MIldly calcified chordae tendinae - Right ventricle: The cavity size was mildly dilated. Wall thickness was normal.   Procedure: Electrical Cardioversion Indications:  Atrial Fibrillation  Procedure Details Consent: Risks of procedure as well as the alternatives and risks of each were explained to the (patient/caregiver).  Consent for procedure obtained. Time Out: Verified patient identification, verified procedure, site/side was marked, verified correct patient position, special equipment/implants available, medications/allergies/relevent history reviewed, required imaging and test results available.  Performed  Patient placed on cardiac monitor, pulse oximetry, supplemental oxygen as necessary.  Sedation given: Propofol per anesthesiology Pacer pads placed anterior and posterior chest.  Cardioverted 1 time(s).  Cardioverted at [place].  Evaluation Findings: Post procedure EKG shows: NSR Complications: None Patient did tolerate procedure well.   Loralie Champagne 12/08/2015, 12:35 PM  ASSESSMENT AND PLAN:  1. PAF - s/p cardioversion/TEE  12/08/15 - holding in NSR. No change with his current regimen.   2. HTN: Well controlled. No change with current regimen.   3. HLD - on statin  4. DM - followed by PCP  5. Dilated ascending aorta - 4.2 on recent TEE - would favor CT/MRI 11/2016   6. Chronic anticoagulation - would not use Mobic regularly. Suggested Tylenol. Continue Eliquis    Current medicines are reviewed with the patient today.  The patient does not have concerns regarding medicines other than what has been noted above.  The following changes have been made:  See above.  Labs/ tests ordered today include:   No orders of the defined types were placed in this encounter.    Jenkins Rouge

## 2016-03-19 ENCOUNTER — Encounter: Payer: Self-pay | Admitting: Cardiovascular Disease

## 2016-03-28 ENCOUNTER — Ambulatory Visit (INDEPENDENT_AMBULATORY_CARE_PROVIDER_SITE_OTHER): Payer: Medicare Other | Admitting: Cardiovascular Disease

## 2016-03-28 ENCOUNTER — Encounter: Payer: Self-pay | Admitting: Cardiovascular Disease

## 2016-03-28 ENCOUNTER — Encounter (INDEPENDENT_AMBULATORY_CARE_PROVIDER_SITE_OTHER): Payer: Self-pay

## 2016-03-28 VITALS — BP 122/80 | HR 56 | Ht 69.0 in | Wt 251.8 lb

## 2016-03-28 DIAGNOSIS — I4819 Other persistent atrial fibrillation: Secondary | ICD-10-CM

## 2016-03-28 DIAGNOSIS — I481 Persistent atrial fibrillation: Secondary | ICD-10-CM | POA: Diagnosis not present

## 2016-03-28 NOTE — Telephone Encounter (Signed)
This encounter was created in error - please disregard.

## 2016-03-28 NOTE — Patient Instructions (Addendum)
Medication Instructions:  Your physician recommends that you continue on your current medications as directed. Please refer to the Current Medication list given to you today.  Labwork: NONE  Testing/Procedures: NONE  Follow-Up: Your physician wants you to follow-up in: 9 months with Dr. Nishan. You will receive a reminder letter in the mail two months in advance. If you don't receive a letter, please call our office to schedule the follow-up appointment.   If you need a refill on your cardiac medications before your next appointment, please call your pharmacy.    

## 2016-04-10 ENCOUNTER — Ambulatory Visit: Payer: Medicare Other | Admitting: Physician Assistant

## 2016-05-08 ENCOUNTER — Telehealth: Payer: Self-pay | Admitting: Cardiovascular Disease

## 2016-05-08 ENCOUNTER — Ambulatory Visit (INDEPENDENT_AMBULATORY_CARE_PROVIDER_SITE_OTHER): Payer: Medicare Other | Admitting: *Deleted

## 2016-05-08 DIAGNOSIS — I482 Chronic atrial fibrillation, unspecified: Secondary | ICD-10-CM

## 2016-05-08 LAB — CBC
HEMATOCRIT: 40.9 % (ref 37.5–51.0)
HEMOGLOBIN: 14.1 g/dL (ref 13.0–17.7)
MCH: 27.5 pg (ref 26.6–33.0)
MCHC: 34.5 g/dL (ref 31.5–35.7)
MCV: 80 fL (ref 79–97)
Platelets: 252 10*3/uL (ref 150–379)
RBC: 5.12 x10E6/uL (ref 4.14–5.80)
RDW: 13.6 % (ref 12.3–15.4)
WBC: 5.8 10*3/uL (ref 3.4–10.8)

## 2016-05-08 LAB — BASIC METABOLIC PANEL
BUN/Creatinine Ratio: 17 (ref 10–24)
BUN: 16 mg/dL (ref 8–27)
CALCIUM: 9.5 mg/dL (ref 8.6–10.2)
CO2: 26 mmol/L (ref 18–29)
CREATININE: 0.92 mg/dL (ref 0.76–1.27)
Chloride: 103 mmol/L (ref 96–106)
GFR calc non Af Amer: 83 mL/min/{1.73_m2} (ref >59)
GFR, EST AFRICAN AMERICAN: 96 mL/min/{1.73_m2} (ref >59)
Glucose: 127 mg/dL — ABNORMAL HIGH (ref 65–99)
Potassium: 4.7 mmol/L (ref 3.5–5.2)
Sodium: 138 mmol/L (ref 134–144)

## 2016-05-08 NOTE — Telephone Encounter (Signed)
Gregg Flores is returning a call

## 2016-05-08 NOTE — Progress Notes (Signed)
Pt was started on Eliquis 5mg  q 12 hours  for Atrial Fib   on 11/03/2015.    Reviewed patients medication list.  Pt is not  currently on any combined P-gp and strong CYP3A4 inhibitors/inducers (ketoconazole, traconazole, ritonavir, carbamazepine, phenytoin, rifampin, St. John's wort).  Reviewed labs.  SrCr 0.92 , Weight 112.18kg .  Dose is appropriate based on age, weight, and SCr.  Hgb 14.1 and HCT 40.9  A full discussion of the nature of anticoagulants has been carried out.  A benefit/risk analysis has been presented to the patient, pt understands the justification for choosing anticoagulation with Eliquis at this time.  The need for compliance is stressed.  Pt is aware to take the medication twice daily.  Side effects of potential bleeding are discussed, including unusual colored urine or stools, coughing up blood or coffee ground emesis, nose bleeds or serious fall or head trauma.  Discussed signs and symptoms of stroke. The patient should avoid any OTC items containing aspirin or ibuprofen.  Avoid alcohol consumption.   Call if any signs of abnormal bleeding.  Discussed financial obligations and pt states is not having any difficulty in obtaining medication.  Next lab test in 6 months. Pt states has not missed any doses of Eliquis and has had no sign or symptom of bleeding nor any sign or symptom of stroke  Reviewed  being on Eliquis and will obtain CBC and BMET today and will call with results  Age 71  Wt 63.18kg Sr Cr Hgb Hct done 05/08/2016  Saw Dr Johnsie Cancel  03/28/2016 Asked to refill  for Eliquis 5mg  q 12 hours but did not need refill 05/09/2016 had called pt several times yesterday but unable to reach him  He returned call today and this nurse informed him that his SrCr and Hgb and HCT were in normal limits and he was on correct dose of Eliquis and to continue on the same dose and made an appt for recheck in 6 months and he states understanding

## 2016-05-09 ENCOUNTER — Encounter: Payer: Self-pay | Admitting: Podiatry

## 2016-05-09 ENCOUNTER — Ambulatory Visit (INDEPENDENT_AMBULATORY_CARE_PROVIDER_SITE_OTHER): Payer: Medicare Other | Admitting: Podiatry

## 2016-05-09 DIAGNOSIS — B351 Tinea unguium: Secondary | ICD-10-CM

## 2016-05-09 DIAGNOSIS — M79676 Pain in unspecified toe(s): Secondary | ICD-10-CM | POA: Diagnosis not present

## 2016-05-09 DIAGNOSIS — E119 Type 2 diabetes mellitus without complications: Secondary | ICD-10-CM

## 2016-05-09 NOTE — Telephone Encounter (Signed)
New Message    Pt states that he is calling back about his labs

## 2016-05-09 NOTE — Telephone Encounter (Signed)
Follow Up   Pt is returning phone call

## 2016-05-09 NOTE — Progress Notes (Signed)
Patient ID: Gregg Flores, male   DOB: Dec 28, 1945, 71 y.o.   MRN: [id number]    Subjective: This patient presents for scheduled visit complaining of elongated and thickened toenails which are uncomfortable when he wears shoes and walks and is requesting toenail debridement  Objective: Orientated 3 DP and PT pulses 2/4 bilaterally Capillary reflex immediate bilaterally Sensation to 10 g monofilament wire intact 5/5 bilaterally Vibratory sensation reactive bilaterally Ankle reflex equal and reactive bilaterally Callus medial nail groove left hallux No open skin lesions bilaterally Atrophic skin without hair growth, bilaterally The toenails are hypertrophic, elongated, discolored, incurvated and tender to direct palpation 6-10  Assessment: Symptomatic onychomycoses 6-10 Diabetic without complications  Plan: Debridement toenails 6-10 mechanically and electronically without any bleeding  Reappoint 3 months

## 2016-05-09 NOTE — Telephone Encounter (Signed)
See encounter note of 05/08/2016

## 2016-05-09 NOTE — Patient Instructions (Signed)

## 2016-07-24 ENCOUNTER — Ambulatory Visit (INDEPENDENT_AMBULATORY_CARE_PROVIDER_SITE_OTHER): Payer: Medicare Other | Admitting: Physician Assistant

## 2016-07-24 ENCOUNTER — Encounter: Payer: Self-pay | Admitting: Physician Assistant

## 2016-07-24 VITALS — BP 133/85 | HR 48 | Temp 98.0°F | Resp 18 | Ht 69.0 in | Wt 244.4 lb

## 2016-07-24 DIAGNOSIS — I10 Essential (primary) hypertension: Secondary | ICD-10-CM | POA: Diagnosis not present

## 2016-07-24 DIAGNOSIS — Z0000 Encounter for general adult medical examination without abnormal findings: Secondary | ICD-10-CM | POA: Diagnosis not present

## 2016-07-24 DIAGNOSIS — Z720 Tobacco use: Secondary | ICD-10-CM

## 2016-07-24 DIAGNOSIS — Z6836 Body mass index (BMI) 36.0-36.9, adult: Secondary | ICD-10-CM

## 2016-07-24 DIAGNOSIS — N183 Chronic kidney disease, stage 3 (moderate): Secondary | ICD-10-CM | POA: Diagnosis not present

## 2016-07-24 DIAGNOSIS — E1122 Type 2 diabetes mellitus with diabetic chronic kidney disease: Secondary | ICD-10-CM | POA: Diagnosis not present

## 2016-07-24 DIAGNOSIS — E785 Hyperlipidemia, unspecified: Secondary | ICD-10-CM | POA: Diagnosis not present

## 2016-07-24 NOTE — Progress Notes (Signed)
Subjective:    Patient ID: Gregg Flores, male    DOB: 08/10/1945, 71 y.o.   MRN: [id number], Gregg Mend, PA-C Chief Complaint  Patient presents with  . Annual Exam    Medicare exam    HPI: 71 y/o M presents today for his medicare annual wellness visit.  Feels pretty good. Works around [employer].  Sleep- 8-10 hours a night. Wakes up around 4 am to urinate.  Diet- likes greens and beans. And eats protein as well.  Exercise- does not do that much at home. Uses the push lawn mower.  Smokes about 8 cigarettes a day. Was trying to quit and started to gain weight- about 15-20 lbs.   Diabetes: Does not do regular foot checks at home but does see a podiatrist, Dr. Amalia Hailey. Last visit with podiatrist was in 04/2016 where he had a full foot exam. Tingling in feet once and a while. Denies tingling in hands.  Sees ophthalmologist once a year. Continues to take Metformin 1000 mg BID without complaints.   Hypertension: Followed by Dr. Johnsie Cancel. Taking Norvasc, Lotensin, HCTZ, and Lopressor for blood pressure control.   Hyperlipidemia:  Last lipid panel was in December 2017.    Patient Active Problem List   Diagnosis Date Noted  . BMI 35.0-35.9,adult 05/18/2014  . History of hepatitis C 06/15/2012  . Hyperlipidemia LDL goal <70 04/06/2012  . Tobacco use 09/25/2011  . Essential hypertension, benign   . Chronic low back pain   . Type 2 diabetes mellitus with renal manifestations ([place])   . Diverticulosis   . Benign prostatic hyperplasia with urinary obstruction   . Monoclonal gammopathy   . Cataracts, bilateral   . Erectile dysfunction due to arterial insufficiency   . Microalbuminuria   . Benign neoplasm of colon 07/01/2008   Past Medical History:  Diagnosis Date  . BPH (benign prostatic hyperplasia)   . Cataracts, bilateral   . Chronic LBP   . Colon polyps   . Diverticulosis   . ED (erectile dysfunction)   . Essential hypertension, benign 1990  . Microalbuminuria   .  Monoclonal gammopathy 04/2008  . Type II or unspecified type diabetes mellitus without mention of complication, not stated as uncontrolled    Prior to Admission medications   Medication Sig Start Date End Date Taking? Authorizing Provider  amLODipine (NORVASC) 10 MG tablet Take 1 tablet (10 mg total) by mouth daily. 01/03/16   Harrison Mons, PA-C  apixaban (ELIQUIS) 5 MG TABS tablet Take 1 tablet (5 mg total) by mouth 2 (two) times daily. 02/06/16   Josue Hector, MD  benazepril (LOTENSIN) 40 MG tablet TAKE 1 TABLET BY MOUTH ONCE DAILY. 01/03/16   Harrison Mons, PA-C  diclofenac sodium (VOLTAREN) 1 % GEL Apply 2 g topically daily as needed. Do not use more than 2 times each week. 01/03/16   Harrison Mons, PA-C  hydrochlorothiazide (HYDRODIURIL) 25 MG tablet TAKE 1 TABLET BY MOUTH EVERY MORNING 01/03/16   Harrison Mons, PA-C  HYDROcodone-acetaminophen (NORCO) 7.5-325 MG tablet Take 1 tablet by mouth 2 (two) times daily as needed. For pain 01/03/16   Harrison Mons, PA-C  ipratropium (ATROVENT) 0.03 % nasal spray Place 2 sprays into the nose every 12 (twelve) hours. 05/18/14   Harrison Mons, PA-C  metFORMIN (GLUCOPHAGE) 1000 MG tablet Take 1 tablet (1,000 mg total) by mouth 2 (two) times daily with a meal. 01/03/16   Jeffery, Chelle, PA-C  metoprolol (LOPRESSOR) 50 MG tablet Take 1 tablet (50 mg  total) by mouth 2 (two) times daily. 01/03/16   Harrison Mons, PA-C  pravastatin (PRAVACHOL) 80 MG tablet Take 1 tablet (80 mg total) by mouth daily. 01/03/16   Harrison Mons, PA-C   No Known Allergies   Review of Systems  Constitutional: Negative for chills and fever.  HENT: Negative.   Eyes: Negative.   Respiratory: Negative for cough, chest tightness and shortness of breath.   Cardiovascular: Negative for chest pain and leg swelling.  Gastrointestinal: Negative for abdominal pain, blood in stool, constipation, diarrhea, nausea and vomiting.  Genitourinary: Negative for dysuria.    Musculoskeletal: Positive for arthralgias.  Skin: Negative.   Neurological: Positive for numbness. Negative for dizziness, light-headedness and headaches.  Psychiatric/Behavioral: Negative for sleep disturbance.       Objective:   Physical Exam  Constitutional: He appears well-developed and well-nourished. No distress.  BP 133/85 (BP Location: Right Arm, Patient Position: Sitting, Cuff Size: Large)   Pulse (!) 48   Temp 98 F (36.7 C) (Oral)   Resp 18   Ht 5\' 9"  (1.753 m)   Wt 244 lb 6.4 oz (110.9 kg)   SpO2 97%   BMI 36.09 kg/m    HENT:  Head: Normocephalic and atraumatic.  Right Ear: External ear normal.  Left Ear: External ear normal.  Mouth/Throat: Oropharynx is clear and moist.  Eyes: Conjunctivae and EOM are normal. Pupils are equal, round, and reactive to light.  Neck: Normal range of motion. Neck supple. No thyromegaly present.  Cardiovascular: Normal rate, regular rhythm, normal heart sounds and intact distal pulses.   Pulmonary/Chest: Effort normal and breath sounds normal.  Lymphadenopathy:    He has no cervical adenopathy.  Skin: Skin is warm and dry. He is not diaphoretic.  Psychiatric: He has a normal mood and affect. His behavior is normal. Judgment and thought content normal.       Assessment & Plan:  1. Medicare annual wellness visit, subsequent Reviewed Medicare Preventative services. Discussed power of attorney, and living wills.   2. Hyperlipidemia LDL goal <70 Well controlled at visit in April. Continue taking Pravastatin as prescribed.  - Basic metabolic panel  3. Essential hypertension, benign Well controlled with current medication regimen. Continue taking Lopressor, HCTZ, Norvasc, and Lotensin. Continue seeing Cardiology.  - CBC with Differential/Platelet - Basic metabolic panel  4. Type 2 diabetes mellitus with stage 3 chronic kidney disease, without long-term current use of insulin (HCC) Continue taking Metformin 1000 mg BID. Continuing  seeing podiatry.  - Basic metabolic panel - Hemoglobin A1c  5. Tobacco use Discussed smoking cessation.  6. BMI 36.0-36.9,adult Discussed improving diet and increasing exercise.   Return in about 3 months (around 10/24/2016) for re-evaluation of diabetes.

## 2016-07-24 NOTE — Progress Notes (Signed)
Presents today for [employer] Visit-Subsequent.   Date of last exam: last visit with me 12/2015. I do not see that he has had a previous Wellness Visit.  Interpreter used for this visit? no  Patient Care Team: Harrison Mons, PA-C as PCP - General (Physician Assistant) Harrison Mons, PA-C as Physician Assistant (Physician Assistant) Clent Jacks, MD as Consulting Physician (Ophthalmology) Rana Snare, MD as Consulting Physician (Urology) Bernadene Bell, MD (Inactive) as Consulting Physician (Hematology) Gean Birchwood, DPM as Consulting Physician (Podiatry) Florentina Addison, NP as Referring Physician (Internal Medicine) Josue Hector, MD as Consulting Physician (Cardiology)   Other items to address today: none His only issue presently is that his insurance card has Dr. Perfecto Kingdom name on it. He has not seen Dr. Everlene Farrier in a number of years, and Dr. Everlene Farrier has now retired. He called to get the name changed and was unsuccessful. "They think I'm stupid and don't know anything."  He relates that he feels well in general. No problems with his medications. Tried to stop smoking, but when he cut back, he gained about 20 lbs, so has not quit. No longer riding his bike for exercise. "I'm too old." He does some yard work.  Cancer Screening: Cervical: n/a Breast: no Colon: yes, 04/2012. Repeat 2021.  Prostate: no longer a candidate   Other Screening: Last screening for diabetes: patient has diabetes Last lipid screening: patient has hyperlipidemia  ADVANCE DIRECTIVES: Discussed: yes Patient desires CPR (Yes ), mechanical ventilation (Yes ), prolonged artificial support (may include mechanical ventilation, tube/PEG feeding, etc) (No ). On File: no Materials Provided: yes  Immunization status:  Immunization History  Administered Date(s) Administered  . Hepatitis A 07/15/2012  . Hepatitis A, Adult 06/30/2013  . Hepatitis B 07/15/2012  . Hepatitis B, adult  08/26/2012, 01/26/2013  . Influenza Split 09/21/2015  . Influenza-Unspecified 10/30/2011, 10/02/2013, 10/15/2014  . Pneumococcal Conjugate-13 06/30/2013  . Pneumococcal Polysaccharide-23 11/22/2004, 09/07/2010  . Tdap 12/06/2009  . Zoster 08/08/2013     Health Maintenance Due  Topic Date Due  . HEMOGLOBIN A1C  07/03/2016     Home Environment: Lives in a single story duplex home. His son lives on the other side.  Functional Status Survey: Is the patient deaf or have difficulty hearing?: Yes Does the patient have difficulty seeing, even when wearing glasses/contacts?: Yes (per pt when reading the newspaper) Does the patient have difficulty concentrating, remembering, or making decisions?: No Does the patient have difficulty walking or climbing stairs?: No Does the patient have difficulty dressing or bathing?: No Does the patient have difficulty doing errands alone such as visiting a doctor's office or shopping?: No    Urinary Incontinence Screening: none  Patient Active Problem List   Diagnosis Date Noted  . BMI 35.0-35.9,adult 05/18/2014  . History of hepatitis C 06/15/2012  . Hyperlipidemia LDL goal <70 04/06/2012  . Tobacco use 09/25/2011  . Essential hypertension, benign   . Chronic low back pain   . Type 2 diabetes mellitus with renal manifestations ([place])   . Diverticulosis   . Benign prostatic hyperplasia with urinary obstruction   . Monoclonal gammopathy   . Cataracts, bilateral   . Erectile dysfunction due to arterial insufficiency   . Microalbuminuria   . Benign neoplasm of colon 07/01/2008     Past Medical History:  Diagnosis Date  . BPH (benign prostatic hyperplasia)   . Cataracts, bilateral   . Chronic LBP   . Colon polyps   . Diverticulosis   .  ED (erectile dysfunction)   . Essential hypertension, benign 1990  . Microalbuminuria   . Monoclonal gammopathy 04/2008  . Type II or unspecified type diabetes mellitus without mention of complication, not  stated as uncontrolled      Past Surgical History:  Procedure Laterality Date  . CARDIOVERSION N/A 12/08/2015   Procedure: CARDIOVERSION;  Surgeon: Larey Dresser, MD;  Location: [facility] ENDOSCOPY;  Service: Cardiovascular;  Laterality: N/A;  . TEE WITHOUT CARDIOVERSION N/A 12/08/2015   Procedure: TRANSESOPHAGEAL ECHOCARDIOGRAM (TEE);  Surgeon: Larey Dresser, MD;  Location: [facility] ENDOSCOPY;  Service: Cardiovascular;  Laterality: N/A;  . TRANSURETHRAL RESECTION OF PROSTATE       Family History  Problem Relation Age of Onset  . Heart disease Mother        s/p CABG  . Hypertension Mother   . Diabetes Brother   . Colon cancer Neg Hx      Social History   Social History  . Marital status: Divorced    Spouse name: n/a  . Number of children: 3  . Years of education: 10   Occupational History  . retired-[employer] History Main Topics  . Smoking status: Current Every Day Smoker    Packs/day: 1.00    Years: 43.00    Types: Cigarettes  . Smokeless tobacco: Never Used     Comment: has cut back to 0.5 ppd (09/27/15) using patches  . Alcohol use No     Comment: occasionally, wine and liquour   . Drug use: No  . Sexual activity: Yes    Birth control/ protection: Condom   Other Topics Concern  . Not on file   Social History Narrative   Lives alone in a duplex.  His son lives on the other side.      No Known Allergies   Prior to Admission medications   Medication Sig Start Date Authorizing Provider  amLODipine (NORVASC) 10 MG tablet Take 1 tablet (10 mg total) by mouth daily. 01/03/16 Harrison Mons, PA-C  apixaban (ELIQUIS) 5 MG TABS tablet Take 1 tablet (5 mg total) by mouth 2 (two) times daily. 02/06/16 Josue Hector, MD  benazepril (LOTENSIN) 40 MG tablet TAKE 1 TABLET BY MOUTH ONCE DAILY. 01/03/16 Harrison Mons, PA-C  diclofenac sodium (VOLTAREN) 1 % GEL Apply 2 g topically daily as needed. Do not use more than 2 times each week.  01/03/16 Harrison Mons, PA-C  hydrochlorothiazide (HYDRODIURIL) 25 MG tablet TAKE 1 TABLET BY MOUTH EVERY MORNING 01/03/16 Harrison Mons, PA-C  HYDROcodone-acetaminophen (NORCO) 7.5-325 MG tablet Take 1 tablet by mouth 2 (two) times daily as needed. For pain 01/03/16 Harrison Mons, PA-C  ipratropium (ATROVENT) 0.03 % nasal spray Place 2 sprays into the nose every 12 (twelve) hours. 05/18/14 Harrison Mons, PA-C  metFORMIN (GLUCOPHAGE) 1000 MG tablet Take 1 tablet (1,000 mg total) by mouth 2 (two) times daily with a meal. 01/03/16 Estefanny Moler, PA-C  metoprolol (LOPRESSOR) 50 MG tablet Take 1 tablet (50 mg total) by mouth 2 (two) times daily. 01/03/16 Harrison Mons, PA-C  pravastatin (PRAVACHOL) 80 MG tablet Take 1 tablet (80 mg total) by mouth daily. 01/03/16 Harrison Mons, PA-C     Depression screen [facility] 2/9 07/24/2016 01/03/2016 09/27/2015 06/14/2015 03/01/2015  Decreased Interest 0 0 0 0 0  Down, Depressed, Hopeless 0 0 0 0 0  PHQ - 2 Score 0 0 0 0 0     Fall Risk  07/24/2016 01/03/2016 09/27/2015  06/14/2015 03/01/2015  Falls in the past year? No No No No No      PHYSICAL EXAM: BP 133/85 (BP Location: Right Arm, Patient Position: Sitting, Cuff Size: Large)   Pulse (!) 48   Temp 98 F (36.7 C) (Oral)   Resp 18   Ht 5\' 9"  (1.753 m)   Wt 244 lb 6.4 oz (110.9 kg)   SpO2 97%   BMI 36.09 kg/m    Wt Readings from Last 3 Encounters:  07/24/16 244 lb 6.4 oz (110.9 kg)  03/28/16 251 lb 12.8 oz (114.2 kg)  01/03/16 239 lb (108.4 kg)      Visual Acuity Screening   Right eye Left eye Both eyes  Without correction: 20/40 20/30 20/20   With correction:     Hearing Screening Comments: Right ear 4ft, left ear 10 ft- pt could hear    Physical Exam  Constitutional: He is oriented to person, place, and time. He appears well-developed and well-nourished. He is active and cooperative. No distress.  HENT:  Head: Normocephalic and atraumatic.  Right Ear: Hearing normal.  Left Ear:  Hearing normal.  Eyes: Conjunctivae are normal. No scleral icterus.  Neck: Phonation normal. Neck supple. No thyromegaly present.  Cardiovascular: Normal rate, regular rhythm and normal heart sounds.   Respiratory: Effort normal and breath sounds normal.  Lymphadenopathy:    He has no cervical adenopathy.  Neurological: He is alert and oriented to person, place, and time.  Skin: Skin is warm and dry.  Psychiatric: He has a normal mood and affect. His speech is normal and behavior is normal.   Diabetic Foot Exam - Simple   Simple Foot Form Visual Inspection No deformities, no ulcerations, no other skin breakdown bilaterally:  Yes Sensation Testing See comments:  Yes Pulse Check Posterior Tibialis and Dorsalis pulse intact bilaterally:  Yes Comments Pt had trouble feeling sensations on the ball of both feet.      Education/Counseling provided regarding diet and exercise, prevention of chronic diseases, smoking/tobacco cessation, if applicable, and reviewed "Covered Medicare Preventive Services."   ASSESSMENT/PLAN: Problem List Items Addressed This Visit    Hyperlipidemia LDL goal <70 (Chronic)    Await labs. Adjust regimen as indicated by results. Goal LDL <70      Relevant Orders   Basic metabolic panel (Completed)   Essential hypertension, benign    COntrolled. Continue current medications. Encouraged exercise-consider joining [employer].      Relevant Orders   CBC with Differential/Platelet (Completed)   Basic metabolic panel (Completed)   Type 2 diabetes mellitus with renal manifestations ([place])    Await labs. Adjust regimen as indicated by results. Encouraged exercise.      Relevant Orders   Basic metabolic panel (Completed)   Hemoglobin A1c (Completed)   Tobacco use    Encouraged smoking cessation.       Other Visit Diagnoses    Medicare annual wellness visit, subsequent    -  Primary   BMI 36.0-36.9,adult           Return in about 3 months  (around 10/24/2016) for re-evaluation of diabetes.   Fara Chute, PA-C [facility]

## 2016-07-24 NOTE — Patient Instructions (Signed)
     IF you received an x-ray today, you will receive an invoice from [facility]. Please contact [facility] at [phone] with questions or concerns regarding your invoice.   IF you received labwork today, you will receive an invoice from [facility]. Please contact [facility] at [phone] with questions or concerns regarding your invoice.   Our billing staff will not be able to assist you with questions regarding bills from these companies.  You will be contacted with the lab results as soon as they are available. The fastest way to get your results is to activate your My Chart account. Instructions are located on the last page of this paperwork. If you have not heard from us regarding the results in 2 weeks, please contact this office.     

## 2016-07-25 LAB — CBC WITH DIFFERENTIAL/PLATELET
BASOS: 1 %
Basophils Absolute: 0 10*3/uL (ref 0.0–0.2)
EOS (ABSOLUTE): 0.1 10*3/uL (ref 0.0–0.4)
EOS: 1 %
Hematocrit: 40.7 % (ref 37.5–51.0)
Hemoglobin: 13.7 g/dL (ref 13.0–17.7)
IMMATURE GRANS (ABS): 0 10*3/uL (ref 0.0–0.1)
IMMATURE GRANULOCYTES: 0 %
LYMPHS: 40 %
Lymphocytes Absolute: 2.1 10*3/uL (ref 0.7–3.1)
MCH: 27.6 pg (ref 26.6–33.0)
MCHC: 33.7 g/dL (ref 31.5–35.7)
MCV: 82 fL (ref 79–97)
Monocytes Absolute: 0.4 10*3/uL (ref 0.1–0.9)
Monocytes: 7 %
NEUTROS ABS: 2.7 10*3/uL (ref 1.4–7.0)
NEUTROS PCT: 51 %
PLATELETS: 264 10*3/uL (ref 150–379)
RBC: 4.97 x10E6/uL (ref 4.14–5.80)
RDW: 14.3 % (ref 12.3–15.4)
WBC: 5.3 10*3/uL (ref 3.4–10.8)

## 2016-07-25 LAB — BASIC METABOLIC PANEL
BUN / CREAT RATIO: 15 (ref 10–24)
BUN: 12 mg/dL (ref 8–27)
CALCIUM: 9.5 mg/dL (ref 8.6–10.2)
CHLORIDE: 102 mmol/L (ref 96–106)
CO2: 21 mmol/L (ref 20–29)
CREATININE: 0.79 mg/dL (ref 0.76–1.27)
GFR calc non Af Amer: 90 mL/min/{1.73_m2} (ref >59)
GFR, EST AFRICAN AMERICAN: 104 mL/min/{1.73_m2} (ref >59)
Glucose: 104 mg/dL — ABNORMAL HIGH (ref 65–99)
Potassium: 4.6 mmol/L (ref 3.5–5.2)
Sodium: 136 mmol/L (ref 134–144)

## 2016-07-25 LAB — HEMOGLOBIN A1C
Est. average glucose Bld gHb Est-mCnc: 157 mg/dL
HEMOGLOBIN A1C: 7.1 % — AB (ref 4.8–5.6)

## 2016-07-25 NOTE — Assessment & Plan Note (Signed)
COntrolled. Continue current medications. Encouraged exercise-consider joining [employer].

## 2016-07-25 NOTE — Assessment & Plan Note (Signed)
Await labs. Adjust regimen as indicated by results. Goal LDL <70 

## 2016-07-25 NOTE — Assessment & Plan Note (Signed)
Encouraged smoking cessation 

## 2016-07-25 NOTE — Assessment & Plan Note (Signed)
Await labs. Adjust regimen as indicated by results. Encouraged exercise.

## 2016-07-30 ENCOUNTER — Encounter: Payer: Self-pay | Admitting: Physician Assistant

## 2016-08-08 ENCOUNTER — Encounter: Payer: Self-pay | Admitting: Podiatry

## 2016-08-08 ENCOUNTER — Ambulatory Visit (INDEPENDENT_AMBULATORY_CARE_PROVIDER_SITE_OTHER): Payer: Medicare Other | Admitting: Podiatry

## 2016-08-08 DIAGNOSIS — B351 Tinea unguium: Secondary | ICD-10-CM | POA: Diagnosis not present

## 2016-08-08 DIAGNOSIS — E119 Type 2 diabetes mellitus without complications: Secondary | ICD-10-CM

## 2016-08-08 DIAGNOSIS — M79676 Pain in unspecified toe(s): Secondary | ICD-10-CM

## 2016-08-08 NOTE — Progress Notes (Signed)
Patient ID: DAMICHAEL HOFMAN, male   DOB: 14-Mar-1945, 71 y.o.   MRN: [id number]    Subjective: This patient presents for scheduled visit complaining of elongated and thickened toenails which are uncomfortable when he wears shoes and walks and is requesting toenail debridement  Objective: Orientated 3 DP and PT pulses 2/4 bilaterally Capillary reflex immediate bilaterally Sensation to 10 g monofilament wire intact 5/5 bilaterally Vibratory sensation reactive bilaterally Ankle reflex equal and reactive bilaterally Callus medial nail groove left hallux No open skin lesions bilaterally Atrophic skin without hair growth, bilaterally The toenails are hypertrophic, elongated, discolored, incurvated and tender to direct palpation 6-10 No deformities noted Manual motor testing dorsi flexion, plantarflexion, inversion, eversion 5/5 bilaterally  Assessment: Symptomatic onychomycoses 6-10 Diabetic without complications  Plan: Debridement toenails 6-10 mechanically and electronically without any bleeding  Reappoint 3 months

## 2016-08-08 NOTE — Patient Instructions (Signed)

## 2016-10-23 ENCOUNTER — Encounter: Payer: Self-pay | Admitting: Physician Assistant

## 2016-10-23 ENCOUNTER — Ambulatory Visit (INDEPENDENT_AMBULATORY_CARE_PROVIDER_SITE_OTHER): Payer: Medicare Other | Admitting: Physician Assistant

## 2016-10-23 VITALS — BP 131/78 | HR 63 | Temp 97.9°F | Resp 18 | Ht 69.0 in | Wt 242.0 lb

## 2016-10-23 DIAGNOSIS — Z720 Tobacco use: Secondary | ICD-10-CM | POA: Diagnosis not present

## 2016-10-23 DIAGNOSIS — E785 Hyperlipidemia, unspecified: Secondary | ICD-10-CM

## 2016-10-23 DIAGNOSIS — E1122 Type 2 diabetes mellitus with diabetic chronic kidney disease: Secondary | ICD-10-CM

## 2016-10-23 DIAGNOSIS — M25532 Pain in left wrist: Secondary | ICD-10-CM | POA: Diagnosis not present

## 2016-10-23 DIAGNOSIS — N183 Chronic kidney disease, stage 3 unspecified: Secondary | ICD-10-CM

## 2016-10-23 DIAGNOSIS — I10 Essential (primary) hypertension: Secondary | ICD-10-CM

## 2016-10-23 NOTE — Patient Instructions (Addendum)
     IF you received an x-ray today, you will receive an invoice from [facility]. Please contact [facility] at [phone] with questions or concerns regarding your invoice.   IF you received labwork today, you will receive an invoice from [facility]. Please contact [facility] at [phone] with questions or concerns regarding your invoice.   Our billing staff will not be able to assist you with questions regarding bills from these companies.  You will be contacted with the lab results as soon as they are available. The fastest way to get your results is to activate your My Chart account. Instructions are located on the last page of this paperwork. If you have not heard from [place] regarding the results in 2 weeks, please contact this office.    Did you know that you begin to benefit from quitting smoking within the first twenty minutes? It's TRUE.  At 20 minutes: -blood pressure decreases -pulse rate drops -body temperature of hands and feet increases  At 8 hours: -carbon monoxide level in blood drops to normal -oxygen level in blood increases to normal  At 24 hours: -the chance of heart attack decreases  At 48 hours: -nerve endings start regrowing -ability to smell and taste is enhanced  2 weeks-3 months: -circulation improves -walking becomes easier -lung function improves  1-9 months: -coughing, sinus congestion, fatigue and shortness of breath decreases  1 year: -excess risk of heart disease is decreased to HALF that of a smoker  5 years: Stroke risk is reduced to that of people who have never smoked  10 years: -risk of lung cancer drops to as little as half that of continuing smokers -risk of cancer of the mouth, throat, esophagus, bladder, kidney and pancreas decreases -risk of ulcer decreases  15 years -risk of heart disease is now similar to that of people who have never smoked -risk of death returns to nearly the level of people who have  never smoked

## 2016-10-23 NOTE — Assessment & Plan Note (Signed)
Reassured that his weight is actually down a little. Encouraged renewed efforts to quit smoking.

## 2016-10-23 NOTE — Assessment & Plan Note (Signed)
Await labs. Adjust regimen as indicated by results. Last A1C was 7.1%. Anticipate it is stable.

## 2016-10-23 NOTE — Progress Notes (Signed)
Patient ID: Gregg Flores, male    DOB: Apr 06, 1945, 71 y.o.   MRN: [id number]  PCP: Harrison Mons, PA-C  Chief Complaint  Patient presents with  . Diabetes  . Follow-up    Pt states he got flu vaccine at [facility] about 2 weeks ago.  . Hand Pain    left hand, pt states pain comes and goes.    Subjective:   Presents for evaluation of diabetes, HTN and hyperlipdemia.  "I've not been doing well. My ex-wife died. One person [in my family] died in a motorcycle crash. Another person is still in the hospital." Good appetite.  Concerned that he has gained weight recently. Is having trouble with quitting smoking, "I just can't."  LEFT wrist pain intermittently x 1 week. LEFT hand dominant, but uses RIGHT hand to hit a ball and shoot a gun.  Sees all his specialists next month: urology Risa Grill), cardiology Johnsie Cancel) and podiatry Amalia Hailey).  Has history of PAF, has been SR since cardioversion 11/2015.  Review of Systems Denies chest pain, shortness of breath, HA, dizziness, vision change, nausea, vomiting, diarrhea, constipation, melena, hematochezia, dysuria, increased urinary urgency or frequency, increased hunger or thirst, unintentional weight change, unexplained myalgias or rash.     Patient Active Problem List   Diagnosis Date Noted  . BMI 35.0-35.9,adult 05/18/2014  . History of hepatitis C 06/15/2012  . Hyperlipidemia LDL goal <70 04/06/2012  . Tobacco use 09/25/2011  . Essential hypertension, benign   . Chronic low back pain   . Type 2 diabetes mellitus with renal manifestations ([place])   . Diverticulosis   . Benign prostatic hyperplasia with urinary obstruction   . Monoclonal gammopathy   . Cataracts, bilateral   . Erectile dysfunction due to arterial insufficiency   . Microalbuminuria   . Benign neoplasm of colon 07/01/2008     Prior to Admission medications   Medication Sig Start Date End Date Taking? Authorizing Provider  amLODipine (NORVASC) 10 MG  tablet Take 1 tablet (10 mg total) by mouth daily. 01/03/16  Yes Aniello Christopoulos, PA-C  apixaban (ELIQUIS) 5 MG TABS tablet Take 1 tablet (5 mg total) by mouth 2 (two) times daily. 02/06/16  Yes Josue Hector, MD  benazepril (LOTENSIN) 40 MG tablet TAKE 1 TABLET BY MOUTH ONCE DAILY. 01/03/16  Yes Mckennon Zwart, PA-C  diclofenac sodium (VOLTAREN) 1 % GEL Apply 2 g topically daily as needed. Do not use more than 2 times each week. 01/03/16  Yes Shawnell Dykes, PA-C  hydrochlorothiazide (HYDRODIURIL) 25 MG tablet TAKE 1 TABLET BY MOUTH EVERY MORNING 01/03/16  Yes Deicy Rusk, PA-C  HYDROcodone-acetaminophen (NORCO) 7.5-325 MG tablet Take 1 tablet by mouth 2 (two) times daily as needed. For pain 01/03/16  Yes Dajanee Voorheis, PA-C  ipratropium (ATROVENT) 0.03 % nasal spray Place 2 sprays into the nose every 12 (twelve) hours. 05/18/14  Yes Kristen Fromm, PA-C  metFORMIN (GLUCOPHAGE) 1000 MG tablet Take 1 tablet (1,000 mg total) by mouth 2 (two) times daily with a meal. 01/03/16  Yes Moriya Mitchell, PA-C  metoprolol (LOPRESSOR) 50 MG tablet Take 1 tablet (50 mg total) by mouth 2 (two) times daily. 01/03/16  Yes Kosta Schnitzler, PA-C  pravastatin (PRAVACHOL) 80 MG tablet Take 1 tablet (80 mg total) by mouth daily. 01/03/16  Yes Karess Harner, PA-C     No Known Allergies     Objective:  Physical Exam  Constitutional: He is oriented to person, place, and time. He appears well-developed and  well-nourished. He is active and cooperative. No distress.  BP 131/78 (BP Location: Left Arm, Patient Position: Sitting, Cuff Size: Large)   Pulse 63   Temp 97.9 F (36.6 C) (Oral)   Resp 18   Ht 5\' 9"  (1.753 m)   Wt 242 lb (109.8 kg)   SpO2 98%   BMI 35.74 kg/m   HENT:  Head: Normocephalic and atraumatic.  Right Ear: Hearing normal.  Left Ear: Hearing normal.  Eyes: Conjunctivae are normal. No scleral icterus.  Neck: Normal range of motion. Neck supple. No thyromegaly present.  Cardiovascular: Normal  rate, regular rhythm and normal heart sounds.   Pulses:      Radial pulses are 2+ on the right side, and 2+ on the left side.  Pulmonary/Chest: Effort normal and breath sounds normal.  Musculoskeletal:       Left wrist: He exhibits tenderness.       Arms: Lymphadenopathy:       Head (right side): No tonsillar, no preauricular, no posterior auricular and no occipital adenopathy present.       Head (left side): No tonsillar, no preauricular, no posterior auricular and no occipital adenopathy present.    He has no cervical adenopathy.       Right: No supraclavicular adenopathy present.       Left: No supraclavicular adenopathy present.  Neurological: He is alert and oriented to person, place, and time. No sensory deficit.  Skin: Skin is warm, dry and intact. No rash noted. No cyanosis or erythema. Nails show no clubbing.  Psychiatric: He has a normal mood and affect. His speech is normal and behavior is normal.     Wt Readings from Last 3 Encounters:  10/23/16 242 lb (109.8 kg)  07/24/16 244 lb 6.4 oz (110.9 kg)  03/28/16 251 lb 12.8 oz (114.2 kg)   Lab Results  Component Value Date   HGBA1C 7.1 (H) 07/24/2016   Lab Results  Component Value Date   LDLCALC 87 01/03/2016       Assessment & Plan:   Problem List Items Addressed This Visit    Hyperlipidemia LDL goal <70 (Chronic)    Await labs. Adjust regimen as indicated by results. Last LDL 87. Goal <70.      Relevant Orders   Comprehensive metabolic panel   Lipid panel   Essential hypertension, benign - Primary    Controlled. Continue current regimen.      Relevant Orders   CBC with Differential/Platelet   Comprehensive metabolic panel   Type 2 diabetes mellitus with renal manifestations ([place])    Await labs. Adjust regimen as indicated by results. Last A1C was 7.1%. Anticipate it is stable.       Relevant Orders   Comprehensive metabolic panel   Hemoglobin A1c   Tobacco use    Reassured that his weight is  actually down a little. Encouraged renewed efforts to quit smoking.       Other Visit Diagnoses    Left wrist pain       likely DJD vs tendonitis. ACE wrap to wrist for compression and stability. radiographs if symptoms persist.   Relevant Orders   Apply ace wrap (Completed)   Care order/instruction: (Completed)       Return in about 3 months (around 01/22/2017) for re-evaluation of diabetes, blood pressure, cholesterol.   Fara Chute, PA-C [facility]

## 2016-10-23 NOTE — Assessment & Plan Note (Signed)
Await labs. Adjust regimen as indicated by results. Last LDL 87. Goal <70.

## 2016-10-23 NOTE — Assessment & Plan Note (Signed)
Controlled. Continue current regimen. 

## 2016-10-24 ENCOUNTER — Telehealth: Payer: Self-pay | Admitting: Cardiovascular Disease

## 2016-10-24 ENCOUNTER — Telehealth: Payer: Self-pay

## 2016-10-24 LAB — CBC WITH DIFFERENTIAL/PLATELET
BASOS: 1 %
Basophils Absolute: 0 10*3/uL (ref 0.0–0.2)
EOS (ABSOLUTE): 0.1 10*3/uL (ref 0.0–0.4)
Eos: 1 %
HEMOGLOBIN: 13.2 g/dL (ref 13.0–17.7)
Hematocrit: 37.5 % (ref 37.5–51.0)
IMMATURE GRANS (ABS): 0 10*3/uL (ref 0.0–0.1)
Immature Granulocytes: 0 %
LYMPHS ABS: 1.9 10*3/uL (ref 0.7–3.1)
LYMPHS: 33 %
MCH: 29 pg (ref 26.6–33.0)
MCHC: 35.2 g/dL (ref 31.5–35.7)
MCV: 82 fL (ref 79–97)
Monocytes Absolute: 0.3 10*3/uL (ref 0.1–0.9)
Monocytes: 5 %
NEUTROS ABS: 3.5 10*3/uL (ref 1.4–7.0)
Neutrophils: 60 %
PLATELETS: 254 10*3/uL (ref 150–379)
RBC: 4.55 x10E6/uL (ref 4.14–5.80)
RDW: 13.7 % (ref 12.3–15.4)
WBC: 5.8 10*3/uL (ref 3.4–10.8)

## 2016-10-24 LAB — COMPREHENSIVE METABOLIC PANEL
A/G RATIO: 1.4 (ref 1.2–2.2)
ALBUMIN: 4.2 g/dL (ref 3.5–4.8)
ALK PHOS: 78 IU/L (ref 39–117)
ALT: 12 IU/L (ref 0–44)
AST: 14 IU/L (ref 0–40)
BILIRUBIN TOTAL: 0.2 mg/dL (ref 0.0–1.2)
BUN / CREAT RATIO: 12 (ref 10–24)
BUN: 10 mg/dL (ref 8–27)
CHLORIDE: 104 mmol/L (ref 96–106)
CO2: 20 mmol/L (ref 20–29)
Calcium: 9.1 mg/dL (ref 8.6–10.2)
Creatinine, Ser: 0.81 mg/dL (ref 0.76–1.27)
GFR calc Af Amer: 103 mL/min/{1.73_m2} (ref >59)
GFR calc non Af Amer: 89 mL/min/{1.73_m2} (ref >59)
Globulin, Total: 3.1 g/dL (ref 1.5–4.5)
Glucose: 114 mg/dL — ABNORMAL HIGH (ref 65–99)
Potassium: 4.5 mmol/L (ref 3.5–5.2)
Sodium: 139 mmol/L (ref 134–144)
Total Protein: 7.3 g/dL (ref 6.0–8.5)

## 2016-10-24 LAB — LIPID PANEL
CHOLESTEROL TOTAL: 126 mg/dL (ref 100–199)
Chol/HDL Ratio: 4.8 ratio (ref 0.0–5.0)
HDL: 26 mg/dL — ABNORMAL LOW (ref >39)
LDL Calculated: 80 mg/dL (ref 0–99)
Triglycerides: 100 mg/dL (ref 0–149)
VLDL Cholesterol Cal: 20 mg/dL (ref 5–40)

## 2016-10-24 LAB — HEMOGLOBIN A1C
Est. average glucose Bld gHb Est-mCnc: 163 mg/dL
Hgb A1c MFr Bld: 7.3 % — ABNORMAL HIGH (ref 4.8–5.6)

## 2016-10-24 NOTE — Telephone Encounter (Signed)
Patient is concerned with the cost of his Eliquis going up every month. I advised him he is probably in the donut hole now. We can try for patient assistance through [employer]. He is amenable to that idea. I have mailed him an application.

## 2016-10-24 NOTE — Telephone Encounter (Signed)
Called and spoke with pt and he states he has talked with Gregg Flores and that she is sending  him an application for patient assistance through drug company Pt states for now we will put everything on hold as he would like to continue taking Eliquis and will see if he can get some help with cost of Eliquis. Pt instructed to keep his scheduled appt in October for followup Eliquis Also he states has 38 tablets of eliquis left  He verbalizes understanding of instructions given

## 2016-10-24 NOTE — Telephone Encounter (Signed)
New Message  Pt call requesting to speak with Gregg Flores. He didn't want to leave a detailed message. Please call back to discuss

## 2016-10-24 NOTE — Telephone Encounter (Signed)
Spoke with pt and he states that he was paying 45 dollars for Eliquis then went to 72 dollars and when he got the medication last the price was 158 dollars and he states he cannot afford this . Talked with him about changing to Warfarin when approved by Dr Johnsie Cancel and he was instructed will have to come to coumadin clinic weekly for first month to have INR checked. He states he does have copay . Pt informed that will take this information to refill to see if they can help with prior approval and will call him back with updated information

## 2016-10-25 ENCOUNTER — Telehealth: Payer: Self-pay

## 2016-10-25 NOTE — Telephone Encounter (Signed)
Call to patient to advise that he comes into office to leave a urine specimen. Patient stated that he gave one at the visit on 10/23/2016.  I checked labs and did not see a urine microalbumin. Patient declined to come in at this time, and said he will give another specimen at his visit in 3 months.

## 2016-10-29 ENCOUNTER — Encounter: Payer: Self-pay | Admitting: Physician Assistant

## 2016-10-29 DIAGNOSIS — N401 Enlarged prostate with lower urinary tract symptoms: Secondary | ICD-10-CM | POA: Diagnosis not present

## 2016-11-06 ENCOUNTER — Other Ambulatory Visit: Payer: Self-pay

## 2016-11-06 ENCOUNTER — Ambulatory Visit (INDEPENDENT_AMBULATORY_CARE_PROVIDER_SITE_OTHER): Payer: Medicare Other | Admitting: *Deleted

## 2016-11-06 DIAGNOSIS — I482 Chronic atrial fibrillation: Secondary | ICD-10-CM | POA: Diagnosis not present

## 2016-11-06 MED ORDER — APIXABAN 5 MG PO TABS: 5.0000 mg | ORAL_TABLET | Freq: Two times a day (BID) | ORAL | 3 refills | Status: DC

## 2016-11-06 NOTE — Telephone Encounter (Signed)
While the pt was at the office this morning for a Coumadin visit he stated that he needs help applying for pt assistance for Eliquis. I helped him complete his application. The pt stated that he was going to ask his pharmacy to fax me an out of pocket expense report showing how much he has paid on his medications so far this year to this office. Awaiting out of pocket expense report from the pts pharmacy.   I have placed the provider portion along with a Eliquis RX in Dr Kyla Balzarine mail bin awaiting his signature.

## 2016-11-06 NOTE — Progress Notes (Signed)
Pt was started on Eliquis 5mg  BID for Afib on 11/03/2015.    Reviewed patients medication list.  Pt is not  currently on any combined P-gp and strong CYP3A4 inhibitors/inducers (ketoconazole, traconazole, ritonavir, carbamazepine, phenytoin, rifampin, St. John's wort).  Reviewed labs.  SCr 0.81, Weight 109.8 Kg, Age 71 yrs old.  Dose appropriate based on age, weight, and SCr.  Hgb and HCT 13.2/37.5, labs from PCP on 10/23/16.  Pt states has not missed any doses of Eliquis and has had no sign or symptom of bleeding nor any sign or symptom of stroke.  A full discussion of the nature of anticoagulants has been carried out.  A benefit/risk analysis has been presented to the patient, so that they understand the justification for choosing anticoagulation with Eliquis at this time.  The need for compliance is stressed.  Pt is aware to take the medication twice daily.  Side effects of potential bleeding are discussed, including unusual colored urine or stools, coughing up blood or coffee ground emesis, nose bleeds or serious fall or head trauma.  Discussed signs and symptoms of stroke. The patient should avoid any OTC items containing aspirin or ibuprofen.  Avoid alcohol consumption.   Call if any signs of abnormal bleeding.  Discussed financial obligations and resolved any difficulty in obtaining medication with Jeani Hawking our pt assistance coordinator.

## 2016-11-07 NOTE — Telephone Encounter (Signed)
I have faxed the pts application and all needed documents to [employer] pt assistance foundation.

## 2016-11-13 ENCOUNTER — Ambulatory Visit: Payer: Medicare Other | Admitting: Podiatry

## 2016-11-15 ENCOUNTER — Telehealth: Payer: Self-pay | Admitting: *Deleted

## 2016-11-15 NOTE — Telephone Encounter (Signed)
Received fax from [employer] regarding patient assistance for [employer]. It was denied stating "patients income". Left patient message regarding the decision.

## 2016-11-20 ENCOUNTER — Ambulatory Visit (INDEPENDENT_AMBULATORY_CARE_PROVIDER_SITE_OTHER): Payer: Medicare Other | Admitting: Podiatry

## 2016-11-20 ENCOUNTER — Encounter: Payer: Self-pay | Admitting: Podiatry

## 2016-11-20 DIAGNOSIS — B351 Tinea unguium: Secondary | ICD-10-CM | POA: Diagnosis not present

## 2016-11-20 DIAGNOSIS — M79676 Pain in unspecified toe(s): Secondary | ICD-10-CM | POA: Diagnosis not present

## 2016-11-20 DIAGNOSIS — E119 Type 2 diabetes mellitus without complications: Secondary | ICD-10-CM

## 2016-11-20 NOTE — Patient Instructions (Signed)

## 2016-11-20 NOTE — Progress Notes (Signed)
Patient ID: Gregg Flores, male   DOB: [birth year], 71 y.o.   MRN: [id number]    Subjective: This patient presents for scheduled visit complaining of elongated and thickened toenails which are uncomfortable when he wears shoes and walks and is requesting toenail debridement  Objective: Orientated 3 DP and PT pulses 2/4 bilaterally Capillary reflex immediate bilaterally Sensation to 10 g monofilament wire intact 5/5 bilaterally Vibratory sensation reactive bilaterally Ankle reflex equal and reactive bilaterally Callus medial nail groove left hallux No open skin lesions bilaterally Atrophic skin without hair growth, bilaterally The toenails are hypertrophic, elongated, discolored, incurvated and tender to direct palpation 6-10 No deformities noted Manual motor testing dorsi flexion, plantarflexion, inversion, eversion 5/5 bilaterally  Assessment: Symptomatic onychomycoses 6-10 Diabetic without complications  Plan: Debridement toenails 6-10 mechanically and electronically without any bleeding Structure patient apply Vaseline to the medial nail groove the left hallux daily and cover with a Band-Aid until the callused nail groove softens  Reappoint 3 months

## 2016-12-08 ENCOUNTER — Other Ambulatory Visit: Payer: Self-pay | Admitting: Physician Assistant

## 2016-12-08 DIAGNOSIS — E785 Hyperlipidemia, unspecified: Secondary | ICD-10-CM

## 2016-12-08 DIAGNOSIS — E1122 Type 2 diabetes mellitus with diabetic chronic kidney disease: Secondary | ICD-10-CM

## 2016-12-08 DIAGNOSIS — I10 Essential (primary) hypertension: Secondary | ICD-10-CM

## 2016-12-08 DIAGNOSIS — N183 Chronic kidney disease, stage 3 unspecified: Secondary | ICD-10-CM

## 2016-12-08 DIAGNOSIS — I4891 Unspecified atrial fibrillation: Secondary | ICD-10-CM

## 2016-12-10 NOTE — Telephone Encounter (Signed)
Multiple meds with a couple needing an appt which he has scheduled for Jan 2019 but is longer than 30 days duration.

## 2017-01-16 ENCOUNTER — Other Ambulatory Visit: Payer: Self-pay

## 2017-01-16 MED ORDER — APIXABAN 5 MG PO TABS: 5.0000 mg | ORAL_TABLET | Freq: Two times a day (BID) | ORAL | 5 refills | Status: DC

## 2017-01-16 NOTE — Telephone Encounter (Signed)
Pt last saw Dr Johnsie Cancel 03/28/16, last labs 10/23/16 Creat 0.81, age 71, weight 109.8kg, based on specified criteria pt is on appropriate dosage of Eliquis 5mg  BID/  Will refill rx.

## 2017-01-28 NOTE — Progress Notes (Signed)
CARDIOLOGY OFFICE NOTE  Date:  02/06/2017    Gregg Flores Date of Birth: 1945-11-28 Medical Record #[id number]  PCP:  [facility], PA-C  Cardiologist:  Johnsie Cancel   No chief complaint on file.   History of Present Illness: Gregg Flores is a 71 y.o. male who presents today for a follow up HTN, PAF and dilated aortic root   He has a history of AF, HTN, DM and elevated lipids. He had some upset stomach with Xarelto and stopped. CHA2DS2Vasc score at least 3. Echo done with EF 55-60%, aortic root was mildly dilated. Normal LA size. No apparent known CAD noted.  Had TEE/DCC November 2017  Was turned down for patient assistance for eliquis Needs f/u imaging for aortic root dilatation 4.2 cm    Past Medical History:  Diagnosis Date  . BPH (benign prostatic hyperplasia)   . Cataracts, bilateral   . Chronic LBP   . Colon polyps   . Diverticulosis   . ED (erectile dysfunction)   . Essential hypertension, benign 1990  . Microalbuminuria   . Monoclonal gammopathy 04/2008  . Type II or unspecified type diabetes mellitus without mention of complication, not stated as uncontrolled     Past Surgical History:  Procedure Laterality Date  . CARDIOVERSION N/A 12/08/2015   Procedure: CARDIOVERSION;  Surgeon: Larey Dresser, MD;  Location: [facility] ENDOSCOPY;  Service: Cardiovascular;  Laterality: N/A;  . TEE WITHOUT CARDIOVERSION N/A 12/08/2015   Procedure: TRANSESOPHAGEAL ECHOCARDIOGRAM (TEE);  Surgeon: Larey Dresser, MD;  Location: [facility];  Service: Cardiovascular;  Laterality: N/A;  . TRANSURETHRAL RESECTION OF PROSTATE       Medications: Current Outpatient Medications  Medication Sig Dispense Refill  . amLODipine (NORVASC) 10 MG tablet take 1 tablet by mouth once daily 90 tablet 1  . apixaban (ELIQUIS) 5 MG TABS tablet Take 1 tablet (5 mg total) by mouth 2 (two) times daily. 60 tablet 5  . atorvastatin (LIPITOR) 40 MG tablet Take 1 tablet (40 mg total) by mouth  daily. 90 tablet 3  . benazepril (LOTENSIN) 40 MG tablet take 1 tablet by mouth once daily 90 tablet 1  . diclofenac sodium (VOLTAREN) 1 % GEL Apply 2 g topically daily as needed. Do not use more than 2 times each week. 100 g 0  . hydrochlorothiazide (HYDRODIURIL) 25 MG tablet Take 1 tablet (25 mg total) by mouth every morning. 90 tablet 3  . HYDROcodone-acetaminophen (NORCO) 7.5-325 MG tablet Take 1 tablet by mouth 2 (two) times daily as needed. For pain 60 tablet 0  . ipratropium (ATROVENT) 0.03 % nasal spray Place 2 sprays into the nose every 12 (twelve) hours. 90 mL 3  . metFORMIN (GLUCOPHAGE) 1000 MG tablet take 1 tablet by mouth twice a day with food 180 tablet 1  . metoprolol tartrate (LOPRESSOR) 50 MG tablet take 1 tablet by mouth twice a day 180 tablet 1  . sitaGLIPtin (JANUVIA) 50 MG tablet Take 1 tablet (50 mg total) by mouth daily. 90 tablet 3   No current facility-administered medications for this visit.     Allergies: No Known Allergies  Social History: The patient  reports that he has been smoking cigarettes.  He has a 43.00 pack-year smoking history. he has never used smokeless tobacco. He reports that he does not drink alcohol or use drugs.   Family History: The patient's family history includes Diabetes in his brother; Heart disease in his mother; Hypertension in his mother.   Review  of Systems: Please see the history of present illness.   Otherwise, the review of systems is positive for none.   All other systems are reviewed and negative.   Physical Exam: VS:  BP 140/78   Pulse (!) 58   Ht 5\' 9"  (1.753 m)   Wt 245 lb (111.1 kg)   SpO2 97%   BMI 36.18 kg/m  .  BMI Body mass index is 36.18 kg/m.  Wt Readings from Last 3 Encounters:  02/06/17 245 lb (111.1 kg)  02/05/17 245 lb 9.6 oz (111.4 kg)  10/23/16 242 lb (109.8 kg)    Affect appropriate Healthy:  appears stated age 32: normal Neck supple with no adenopathy JVP normal no bruits no  thyromegaly Lungs clear with no wheezing and good diaphragmatic motion Heart:  S1/S2 no murmur, no rub, gallop or click PMI normal Abdomen: benighn, BS positve, no tenderness, no AAA no bruit.  No HSM or HJR Distal pulses intact with no bruits No edema Neuro non-focal Skin warm and dry No muscular weakness    LABORATORY DATA:  EKG:  12/26/15  sinus bradycardia with PAC. Poor R wave progression. 02/07/16 SR rate 53 normal   Lab Results  Component Value Date   WBC 5.0 02/05/2017   HGB 12.8 (L) 02/05/2017   HCT 38.5 02/05/2017   PLT 238 02/05/2017   GLUCOSE 116 (H) 02/05/2017   CHOL 124 02/05/2017   TRIG 127 02/05/2017   HDL 24 (L) 02/05/2017   LDLCALC 75 02/05/2017   ALT 13 02/05/2017   AST 12 02/05/2017   NA 140 02/05/2017   K 4.3 02/05/2017   CL 104 02/05/2017   CREATININE 0.82 02/05/2017   BUN 14 02/05/2017   CO2 18 (L) 02/05/2017   INR 1.1 11/24/2015   HGBA1C 7.0 (H) 02/05/2017   MICROALBUR 7.4 11/16/2014    BNP (last 3 results) No results for input(s): BNP in the last 8760 hours.  ProBNP (last 3 results) No results for input(s): PROBNP in the last 8760 hours.   Other Studies Reviewed Today: Procedure: TEE  Indication: Atrial fibrillation  Sedation: Per anesthesiology.  Findings: Please see echo section for full report.  The patient was in rapid atrial fibrillation.  Normal LV size with moderate LV hypertrophy.  EF 45%, diffuse hypokinesis.  Normal RV size and systolic function.  Mild left atrial enlargement, no LA appendage thrombus.  Normal right atrium.  Trileaflet aortic valve with no stenosis or regurgitation.  Trivial MR.  There appeared to be a loose chord attached to the anterior mitral leaflet.  However, there was only mild mitral regurgitation.  No PFO/ASD, negative bubble study.  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     May proceed to DCCV.  Would repeat echo in sinus rhythm  to confirm EF.   Loralie Champagne 12/08/2015  ECHO 08/09/15 Study Conclusions  - Left ventricle: The cavity size was normal. Systolic function was normal. The estimated ejection fraction was in the range of 55% to 60%. Wall motion was normal; there were no regional wall motion abnormalities. The study was not technically sufficient to allow evaluation of LV diastolic dysfunction due to atrial fibrillation. - Aortic valve: Trileaflet; mildly thickened, mildly calcified leaflets. Moderate focal calcification involving the noncoronary cusp. - Aorta: Aortic root dimension: 39 mm (ED). Ascending aorta diameter: 4.39 mm (ED). - Aortic root: The aortic root was mildly dilated. - Ascending aorta: The ascending aorta was  mildly dilated. - Mitral valve: MIldly calcified chordae tendinae - Right ventricle: The cavity size was mildly dilated. Wall thickness was normal.   Procedure: Electrical Cardioversion Indications:  Atrial Fibrillation  Procedure Details Consent: Risks of procedure as well as the alternatives and risks of each were explained to the (patient/caregiver).  Consent for procedure obtained. Time Out: Verified patient identification, verified procedure, site/side was marked, verified correct patient position, special equipment/implants available, medications/allergies/relevent history reviewed, required imaging and test results available.  Performed  Patient placed on cardiac monitor, pulse oximetry, supplemental oxygen as necessary.  Sedation given: Propofol per anesthesiology Pacer pads placed anterior and posterior chest.  Cardioverted 1 time(s).  Cardioverted at [place].  Evaluation Findings: Post procedure EKG shows: NSR Complications: None Patient did tolerate procedure well.   Loralie Champagne 12/08/2015, 12:35 PM  ASSESSMENT AND PLAN:  1. PAF - s/p cardioversion/TEE  12/08/15 - holding in NSR. No change with his current regimen.   2. HTN:  Well controlled. No change with current regimen.   3. HLD - on statin  4. DM - Discussed low carb diet.  Target hemoglobin A1c is 6.5 or less.  Continue current medications.  5. Dilated ascending aorta - 4.2 on  TEE 12/08/15 Cr .8 02/05/17 will try to do CTA Aorta in office today   6. Chronic anticoagulation - would not use Mobic regularly. Suggested Tylenol. Continue Eliquis    Current medicines are reviewed with the patient today.  The patient does not have concerns regarding medicines other than what has been noted above.  The following changes have been made:  See above.  Labs/ tests ordered today include:   No orders of the defined types were placed in this encounter.    Jenkins Rouge

## 2017-01-31 ENCOUNTER — Other Ambulatory Visit: Payer: Self-pay | Admitting: Physician Assistant

## 2017-01-31 DIAGNOSIS — I10 Essential (primary) hypertension: Secondary | ICD-10-CM

## 2017-02-05 ENCOUNTER — Encounter: Payer: Self-pay | Admitting: Physician Assistant

## 2017-02-05 ENCOUNTER — Other Ambulatory Visit: Payer: Self-pay

## 2017-02-05 ENCOUNTER — Ambulatory Visit: Payer: Medicare Other | Admitting: Physician Assistant

## 2017-02-05 VITALS — BP 120/68 | HR 60 | Temp 98.7°F | Resp 18 | Ht 69.0 in | Wt 245.6 lb

## 2017-02-05 DIAGNOSIS — I10 Essential (primary) hypertension: Secondary | ICD-10-CM

## 2017-02-05 DIAGNOSIS — Z720 Tobacco use: Secondary | ICD-10-CM

## 2017-02-05 DIAGNOSIS — M545 Low back pain: Secondary | ICD-10-CM | POA: Diagnosis not present

## 2017-02-05 DIAGNOSIS — N183 Chronic kidney disease, stage 3 unspecified: Secondary | ICD-10-CM

## 2017-02-05 DIAGNOSIS — E1122 Type 2 diabetes mellitus with diabetic chronic kidney disease: Secondary | ICD-10-CM

## 2017-02-05 DIAGNOSIS — E785 Hyperlipidemia, unspecified: Secondary | ICD-10-CM | POA: Diagnosis not present

## 2017-02-05 DIAGNOSIS — G8929 Other chronic pain: Secondary | ICD-10-CM

## 2017-02-05 MED ORDER — ATORVASTATIN CALCIUM 40 MG PO TABS: 40.0000 mg | ORAL_TABLET | Freq: Every day | ORAL | 3 refills | Status: DC

## 2017-02-05 MED ORDER — SITAGLIPTIN PHOSPHATE 50 MG PO TABS: 50.0000 mg | ORAL_TABLET | Freq: Every day | ORAL | 3 refills | Status: DC

## 2017-02-05 MED ORDER — HYDROCHLOROTHIAZIDE 25 MG PO TABS: 25.0000 mg | ORAL_TABLET | Freq: Every morning | ORAL | 3 refills | Status: AC

## 2017-02-05 NOTE — Patient Instructions (Addendum)
CHOLESTEROL:  STOP the pravastatin. START the atorvastatin (Lipitor). Let me know if it's too expensive.  DIABETES: CONTINUE the metformin. START the sitagliptin (Januvia). Let me know if it's too expensive.  Keep working on quitting smoking. Try to get some exercise every day (about 30 minutes). If it's cold outside, try walking in place! Or doing other exercises in your home.    IF you received an x-ray today, you will receive an invoice from [facility]. Please contact [facility] at [phone] with questions or concerns regarding your invoice.   IF you received labwork today, you will receive an invoice from [facility]. Please contact [facility] at [phone] with questions or concerns regarding your invoice.   Our billing staff will not be able to assist you with questions regarding bills from these companies.  You will be contacted with the lab results as soon as they are available. The fastest way to get your results is to activate your My Chart account. Instructions are located on the last page of this paperwork. If you have not heard from [place] regarding the results in 2 weeks, please contact this office.

## 2017-02-05 NOTE — Progress Notes (Signed)
Patient ID: Gregg Flores, male    DOB: 02/15/1945, 72 y.o.   MRN: [id number]  PCP: [facility], PA-C  Chief Complaint  Patient presents with  . Diabetes  . Hypertension  . Hyperlipidemia  . Follow-up    Subjective:   Presents for evaluation of HTN, hyperlipidemia and HTN.  HTN has been well controlled on 4 drug regimen for some time.   Hyperlipidemia is treated with pravastatin, and LDL has hovered in the 80's, just slightly above goal of <70.  Diabetes control has been stable, but also slightly above goal, with A1C 7.1-7.3%, on metformin 1000 mg BID. Eating a lot of bananas, kale (and other greens), and beans. Minimal exercise.  Diabetic eye exam is scheduled for later this week.  Sees cardiology tomorrow.  Last cardiology visit was in February 2018. He continues Eliquis for Afib, in SR since cardioversion in November 2017. He's to have imaging to follow dilated thoracic aorta noted on CXR in 03/2004.  Continues to smoke, but working on cutting back. Currently 10 cigarettes today. Previous quit, but gained a lot of weight, so started back.  He relates intermittent cold symptoms for the past 2 months.  Clear sputum from the nose and with cough.  No cough today.  Left wrist pain and low back pain persist.  The wrist hurts with opening jars or picking things up.  Wearing a compression wrap helps.  Back pain is now seldom, he has not taken medications for either of these in the past 2 months..    Review of Systems  Constitutional: Negative for activity change, appetite change, fatigue and unexpected weight change.  HENT: Negative for congestion, dental problem, ear pain, hearing loss, mouth sores, postnasal drip, rhinorrhea, sneezing, sore throat, tinnitus and trouble swallowing.   Eyes: Negative for photophobia, pain, redness and visual disturbance.  Respiratory: Negative for cough, chest tightness and shortness of breath.   Cardiovascular: Negative for chest pain,  palpitations and leg swelling.  Gastrointestinal: Negative for abdominal pain, blood in stool, constipation, diarrhea, nausea and vomiting.  Endocrine: Negative for cold intolerance, heat intolerance, polydipsia, polyphagia and polyuria.  Genitourinary: Negative for dysuria, frequency, hematuria and urgency.  Musculoskeletal: Positive for arthralgias and back pain. Negative for gait problem, myalgias and neck stiffness.  Skin: Negative for rash.  Neurological: Negative for dizziness, speech difficulty, weakness, light-headedness, numbness and headaches.  Hematological: Negative for adenopathy.  Psychiatric/Behavioral: Negative for confusion and sleep disturbance. The patient is not nervous/anxious.        Patient Active Problem List   Diagnosis Date Noted  . BMI 35.0-35.9,adult 05/18/2014  . History of hepatitis C 06/15/2012  . Hyperlipidemia LDL goal <70 04/06/2012  . Tobacco use 09/25/2011  . Essential hypertension, benign   . Chronic low back pain   . Type 2 diabetes mellitus with renal manifestations ([place])   . Diverticulosis   . Benign prostatic hyperplasia with urinary obstruction   . Monoclonal gammopathy   . Cataracts, bilateral   . Erectile dysfunction due to arterial insufficiency   . Microalbuminuria   . Benign neoplasm of colon 07/01/2008     Prior to Admission medications   Medication Sig Start Date End Date Taking? Authorizing Provider  amLODipine (NORVASC) 10 MG tablet take 1 tablet by mouth once daily 12/10/16  Yes Daneille Desilva, PA-C  apixaban (ELIQUIS) 5 MG TABS tablet Take 1 tablet (5 mg total) by mouth 2 (two) times daily. 01/16/17  Yes Josue Hector, MD  benazepril (  LOTENSIN) 40 MG tablet take 1 tablet by mouth once daily 12/10/16  Yes Malu Pellegrini, PA-C  diclofenac sodium (VOLTAREN) 1 % GEL Apply 2 g topically daily as needed. Do not use more than 2 times each week. 01/03/16  Yes Kyla Duffy, PA-C  hydrochlorothiazide (HYDRODIURIL) 25 MG tablet  take 1 tablet by mouth every morning 01/31/17  Yes Narciso Stoutenburg, PA-C  HYDROcodone-acetaminophen (NORCO) 7.5-325 MG tablet Take 1 tablet by mouth 2 (two) times daily as needed. For pain 01/03/16  Yes Connery Shiffler, PA-C  ipratropium (ATROVENT) 0.03 % nasal spray Place 2 sprays into the nose every 12 (twelve) hours. 05/18/14  Yes Ronetta Molla, PA-C  metFORMIN (GLUCOPHAGE) 1000 MG tablet take 1 tablet by mouth twice a day with food 12/10/16  Yes Berlie Persky, PA-C  metoprolol tartrate (LOPRESSOR) 50 MG tablet take 1 tablet by mouth twice a day 12/10/16  Yes Rupa Lagan, PA-C  pravastatin (PRAVACHOL) 80 MG tablet take 1 tablet by mouth once daily 12/10/16  Yes Ingeborg Fite, PA-C     No Known Allergies     Objective:  Physical Exam  Constitutional: He is oriented to person, place, and time. He appears well-developed and well-nourished. He is active and cooperative. No distress.  BP 120/68 (BP Location: Left Arm, Patient Position: Sitting, Cuff Size: Large)   Pulse 60   Temp 98.7 F (37.1 C) (Oral)   Resp 18   Ht 5\' 9"  (1.753 m)   Wt 245 lb 9.6 oz (111.4 kg)   SpO2 96%   BMI 36.27 kg/m   HENT:  Head: Normocephalic and atraumatic.  Right Ear: Hearing normal.  Left Ear: Hearing normal.  Eyes: Conjunctivae are normal. No scleral icterus.  Neck: Normal range of motion. Neck supple. No thyromegaly present.  Cardiovascular: Normal rate, regular rhythm and normal heart sounds.  Pulses:      Radial pulses are 2+ on the right side, and 2+ on the left side.  Pulmonary/Chest: Effort normal and breath sounds normal.  Lymphadenopathy:       Head (right side): No tonsillar, no preauricular, no posterior auricular and no occipital adenopathy present.       Head (left side): No tonsillar, no preauricular, no posterior auricular and no occipital adenopathy present.    He has no cervical adenopathy.       Right: No supraclavicular adenopathy present.       Left: No supraclavicular  adenopathy present.  Neurological: He is alert and oriented to person, place, and time. No sensory deficit.  Skin: Skin is warm, dry and intact. No rash noted. No cyanosis or erythema. Nails show no clubbing.  Psychiatric: He has a normal mood and affect. His speech is normal and behavior is normal.           Assessment & Plan:   Problem List Items Addressed This Visit    Hyperlipidemia LDL goal <70 (Chronic)    Has been controlled, LDL hovering in the 80s.  Goal LDL is less than 70.  Await labs.  Continue changing statin from pravastatin to atorvastatin.      Relevant Medications   atorvastatin (LIPITOR) 40 MG tablet   hydrochlorothiazide (HYDRODIURIL) 25 MG tablet   Other Relevant Orders   Comprehensive metabolic panel (Completed)   Lipid panel (Completed)   Essential hypertension, benign    Controlled.  Continue current treatment with benazepril and amlodipine and hydrochlorothiazide and metoprolol tartrate.      Relevant Medications   atorvastatin (LIPITOR) 40 MG tablet  hydrochlorothiazide (HYDRODIURIL) 25 MG tablet   Other Relevant Orders   CBC with Differential/Platelet (Completed)   Comprehensive metabolic panel (Completed)   Chronic low back pain    Stable.  Pain is only intermittent now.  Continue as needed hydrocodone.      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  Would like to see his hemoglobin A1c below 7%.  Continue healthy eating choices.  Try to increase exercise.  Continue metformin 1000 mg twice daily.      Relevant Medications   sitaGLIPtin (JANUVIA) 50 MG tablet   atorvastatin (LIPITOR) 40 MG tablet   Other Relevant Orders   Comprehensive metabolic panel (Completed)   Hemoglobin A1c (Completed)   HM DIABETES EYE EXAM (Completed)   Tobacco use    Again encourage smoking cessation.  Counseled on methods he may want to employ to reduce anticipated increased oral intake and weight gain.           Return in about 3 months (around 05/06/2017) for re-evalaution of diabetes, blood pressure and cholesterol.   Fara Chute, PA-C [facility]

## 2017-02-06 ENCOUNTER — Encounter (INDEPENDENT_AMBULATORY_CARE_PROVIDER_SITE_OTHER): Payer: Self-pay

## 2017-02-06 ENCOUNTER — Ambulatory Visit (INDEPENDENT_AMBULATORY_CARE_PROVIDER_SITE_OTHER)
Admission: RE | Admit: 2017-02-06 | Discharge: 2017-02-06 | Disposition: A | Discharge status: Home / Self Care | Payer: Medicare Other | Source: Ambulatory Visit | Attending: Cardiovascular Disease | Admitting: Cardiovascular Disease

## 2017-02-06 ENCOUNTER — Ambulatory Visit: Payer: Medicare Other | Admitting: Cardiovascular Disease

## 2017-02-06 ENCOUNTER — Encounter: Payer: Self-pay | Admitting: Cardiovascular Disease

## 2017-02-06 ENCOUNTER — Encounter: Payer: Self-pay | Admitting: Physician Assistant

## 2017-02-06 ENCOUNTER — Telehealth: Payer: Self-pay

## 2017-02-06 VITALS — BP 140/78 | HR 58 | Ht 69.0 in | Wt 245.0 lb

## 2017-02-06 DIAGNOSIS — I481 Persistent atrial fibrillation: Secondary | ICD-10-CM

## 2017-02-06 DIAGNOSIS — I712 Thoracic aortic aneurysm, without rupture, unspecified: Secondary | ICD-10-CM | POA: Insufficient documentation

## 2017-02-06 DIAGNOSIS — I10 Essential (primary) hypertension: Secondary | ICD-10-CM

## 2017-02-06 DIAGNOSIS — E782 Mixed hyperlipidemia: Secondary | ICD-10-CM | POA: Diagnosis not present

## 2017-02-06 DIAGNOSIS — I719 Aortic aneurysm of unspecified site, without rupture: Secondary | ICD-10-CM

## 2017-02-06 DIAGNOSIS — I4819 Other persistent atrial fibrillation: Secondary | ICD-10-CM

## 2017-02-06 LAB — COMPREHENSIVE METABOLIC PANEL
ALBUMIN: 4.4 g/dL (ref 3.5–4.8)
ALK PHOS: 74 IU/L (ref 39–117)
ALT: 13 IU/L (ref 0–44)
AST: 12 IU/L (ref 0–40)
Albumin/Globulin Ratio: 1.5 (ref 1.2–2.2)
BUN / CREAT RATIO: 17 (ref 10–24)
BUN: 14 mg/dL (ref 8–27)
Bilirubin Total: 0.2 mg/dL (ref 0.0–1.2)
CALCIUM: 9.2 mg/dL (ref 8.6–10.2)
CO2: 18 mmol/L — AB (ref 20–29)
CREATININE: 0.82 mg/dL (ref 0.76–1.27)
Chloride: 104 mmol/L (ref 96–106)
GFR calc Af Amer: 102 mL/min/{1.73_m2} (ref >59)
GFR, EST NON AFRICAN AMERICAN: 88 mL/min/{1.73_m2} (ref >59)
GLUCOSE: 116 mg/dL — AB (ref 65–99)
Globulin, Total: 3 g/dL (ref 1.5–4.5)
Potassium: 4.3 mmol/L (ref 3.5–5.2)
Sodium: 140 mmol/L (ref 134–144)
Total Protein: 7.4 g/dL (ref 6.0–8.5)

## 2017-02-06 LAB — CBC WITH DIFFERENTIAL/PLATELET
BASOS ABS: 0 10*3/uL (ref 0.0–0.2)
Basos: 1 %
EOS (ABSOLUTE): 0.1 10*3/uL (ref 0.0–0.4)
Eos: 2 %
HEMOGLOBIN: 12.8 g/dL — AB (ref 13.0–17.7)
Hematocrit: 38.5 % (ref 37.5–51.0)
IMMATURE GRANS (ABS): 0 10*3/uL (ref 0.0–0.1)
IMMATURE GRANULOCYTES: 0 %
LYMPHS ABS: 2.1 10*3/uL (ref 0.7–3.1)
LYMPHS: 42 %
MCH: 27.3 pg (ref 26.6–33.0)
MCHC: 33.2 g/dL (ref 31.5–35.7)
MCV: 82 fL (ref 79–97)
MONOCYTES: 8 %
Monocytes Absolute: 0.4 10*3/uL (ref 0.1–0.9)
NEUTROS PCT: 47 %
Neutrophils Absolute: 2.4 10*3/uL (ref 1.4–7.0)
Platelets: 238 10*3/uL (ref 150–379)
RBC: 4.69 x10E6/uL (ref 4.14–5.80)
RDW: 14.8 % (ref 12.3–15.4)
WBC: 5 10*3/uL (ref 3.4–10.8)

## 2017-02-06 LAB — LIPID PANEL
Chol/HDL Ratio: 5.2 ratio — ABNORMAL HIGH (ref 0.0–5.0)
Cholesterol, Total: 124 mg/dL (ref 100–199)
HDL: 24 mg/dL — ABNORMAL LOW (ref >39)
LDL Calculated: 75 mg/dL (ref 0–99)
TRIGLYCERIDES: 127 mg/dL (ref 0–149)
VLDL Cholesterol Cal: 25 mg/dL (ref 5–40)

## 2017-02-06 LAB — HEMOGLOBIN A1C
ESTIMATED AVERAGE GLUCOSE: 154 mg/dL
HEMOGLOBIN A1C: 7 % — AB (ref 4.8–5.6)

## 2017-02-06 MED ORDER — APIXABAN 5 MG PO TABS: 5.0000 mg | ORAL_TABLET | Freq: Two times a day (BID) | ORAL | 3 refills | Status: DC

## 2017-02-06 MED ORDER — IOPAMIDOL (ISOVUE-370) INJECTION 76%
100.0000 mL | Freq: Once | INTRAVENOUS | Status: AC | PRN
  Administered 2017-02-06: 100 mL via INTRAVENOUS

## 2017-02-06 NOTE — Patient Instructions (Addendum)
Medication Instructions:  Your physician recommends that you continue on your current medications as directed. Please refer to the Current Medication list given to you today.  Labwork: NONE  Testing/Procedures: CTA of chest for aorta scanning, (CAT scanning), is a noninvasive, special x-ray that produces cross-sectional images of the body using x-rays and a computer. CT scans help physicians diagnose and treat medical conditions. For some CT exams, a contrast material is used to enhance visibility in the area of the body being studied. CT scans provide greater clarity and reveal more details than regular x-ray exams.  Follow-Up: Your physician wants you to follow-up in: 12 months with Dr. Johnsie Cancel. You will receive a reminder letter in the mail two months in advance. If you don't receive a letter, please call our office to schedule the follow-up appointment.   If you need a refill on your cardiac medications before your next appointment, please call your pharmacy.

## 2017-02-06 NOTE — Telephone Encounter (Signed)
Patient aware of CT results. Per Dr. Johnsie Cancel, Aortic aneuryms 4.4 cm a bit bigger than seen on echo before f/u CTA chest in a year. Patient verbalized understanding.

## 2017-02-06 NOTE — Telephone Encounter (Signed)
-----   Message from Josue Hector, MD sent at 02/06/2017 12:43 PM EST ----- Aortic aneuryms 4.4 cm a bit bigger than seen on echo before f/u CTA chest in a year

## 2017-02-06 NOTE — Addendum Note (Signed)
Addended by: Aris Georgia, Nysia Dell L on: 02/06/2017 08:59 AM   Modules accepted: Orders

## 2017-02-07 LAB — HM DIABETES EYE EXAM

## 2017-02-18 NOTE — Assessment & Plan Note (Signed)
Stable.  Pain is only intermittent now.  Continue as needed hydrocodone.

## 2017-02-18 NOTE — Assessment & Plan Note (Signed)
Await labs.  Would like to see his hemoglobin A1c below 7%.  Continue healthy eating choices.  Try to increase exercise.  Continue metformin 1000 mg twice daily.

## 2017-02-18 NOTE — Assessment & Plan Note (Addendum)
Controlled.  Continue current treatment with benazepril and amlodipine and hydrochlorothiazide and metoprolol tartrate.

## 2017-02-18 NOTE — Assessment & Plan Note (Signed)
Has been controlled, LDL hovering in the 80s.  Goal LDL is less than 70.  Await labs.  Continue changing statin from pravastatin to atorvastatin.

## 2017-02-18 NOTE — Assessment & Plan Note (Signed)
Again encourage smoking cessation.  Counseled on methods he may want to employ to reduce anticipated increased oral intake and weight gain.

## 2017-02-22 ENCOUNTER — Encounter: Payer: Self-pay | Admitting: Physician Assistant

## 2017-02-26 ENCOUNTER — Ambulatory Visit: Payer: Medicare Other | Admitting: Podiatry

## 2017-02-27 ENCOUNTER — Ambulatory Visit: Payer: Medicare Other | Admitting: Podiatry

## 2017-02-27 ENCOUNTER — Encounter: Payer: Self-pay | Admitting: Podiatry

## 2017-02-27 DIAGNOSIS — B351 Tinea unguium: Secondary | ICD-10-CM

## 2017-02-27 DIAGNOSIS — M79609 Pain in unspecified limb: Secondary | ICD-10-CM | POA: Diagnosis not present

## 2017-02-27 DIAGNOSIS — D689 Coagulation defect, unspecified: Secondary | ICD-10-CM

## 2017-02-27 NOTE — Progress Notes (Signed)
Subjective:   Patient ID: Gregg Flores, male   DOB: 72 y.o.   MRN: [id number]   HPI Patient presents with elongated nailbeds 1-5 both feet that he cannot cut there are thick and they are painful   ROS      Objective:  Physical Exam  Neurovascular status intact with thick yellow brittle nailbeds 1-5 both feet     Assessment:  Mycotic nail infection with pain 1-5 both feet     Plan:  Debride painful nailbeds 1-5 both feet with no iatrogenic bleeding noted

## 2017-05-06 ENCOUNTER — Encounter: Payer: Self-pay | Admitting: Physician Assistant

## 2017-05-07 ENCOUNTER — Ambulatory Visit: Payer: Medicare Other | Admitting: Physician Assistant

## 2017-05-23 ENCOUNTER — Telehealth: Payer: Self-pay | Admitting: Physician Assistant

## 2017-05-23 NOTE — Telephone Encounter (Signed)
Called pt and left VM trying to reschedule their missed appt from 05/07/17. If pt calls back, please reschedule them at their convenience. Thanks!

## 2017-05-28 ENCOUNTER — Encounter: Payer: Self-pay | Admitting: Podiatry

## 2017-05-28 ENCOUNTER — Ambulatory Visit: Payer: Medicare Other | Admitting: Podiatry

## 2017-05-28 DIAGNOSIS — D689 Coagulation defect, unspecified: Secondary | ICD-10-CM

## 2017-05-28 DIAGNOSIS — B351 Tinea unguium: Secondary | ICD-10-CM | POA: Diagnosis not present

## 2017-05-28 DIAGNOSIS — E119 Type 2 diabetes mellitus without complications: Secondary | ICD-10-CM

## 2017-05-28 DIAGNOSIS — M79676 Pain in unspecified toe(s): Secondary | ICD-10-CM | POA: Diagnosis not present

## 2017-05-28 DIAGNOSIS — M79609 Pain in unspecified limb: Secondary | ICD-10-CM

## 2017-05-28 NOTE — Progress Notes (Signed)
He presents today with a chief complaint of painful elongated toenails.  Objective: Vital signs are stable alert and oriented x3.  Pulses are palpable.  Neurologic sensorium is intact.  Deep tendon reflexes are intact muscle strength is normal.  Toenails are long thick yellow dystrophic like mycotic.  Assessment: Pain in limb secondary to onychomycosis.  Plan: Debridement of nails 1 through 5 bilateral.

## 2017-06-26 ENCOUNTER — Encounter: Payer: Self-pay | Admitting: Family Medicine

## 2017-08-26 ENCOUNTER — Other Ambulatory Visit: Payer: Medicare Other

## 2017-08-30 ENCOUNTER — Ambulatory Visit: Payer: Medicare Other | Admitting: Podiatry

## 2017-08-30 DIAGNOSIS — E119 Type 2 diabetes mellitus without complications: Secondary | ICD-10-CM

## 2017-08-30 DIAGNOSIS — D689 Coagulation defect, unspecified: Secondary | ICD-10-CM | POA: Diagnosis not present

## 2017-08-30 DIAGNOSIS — M79676 Pain in unspecified toe(s): Secondary | ICD-10-CM

## 2017-08-30 DIAGNOSIS — M79609 Pain in unspecified limb: Secondary | ICD-10-CM

## 2017-08-30 DIAGNOSIS — B351 Tinea unguium: Secondary | ICD-10-CM

## 2017-09-01 NOTE — Progress Notes (Signed)
  Subjective:  Patient ID: Gregg Flores, male    DOB: Dec 26, 1945,  MRN: [id number]  No chief complaint on file.  72 y.o. male returns for the above complaint.  Reports pain in his toenails is not able to take care of them himself. On anticoagulant  Objective:  There were no vitals filed for this visit. General AA&O x3. Normal mood and affect.  Vascular Pedal pulses palpable.  Neurologic Epicritic sensation grossly intact.  Dermatologic No open lesions. Skin normal texture and turgor. Toenails x 10 elongated, thickened, dystrophic.  Orthopedic: Pain to palpation about the toenails.   Assessment & Plan:  Patient was evaluated and treated and all questions answered.  Onychomycosis with pain  -Nails palliatively debrided as below. -Educated on self-care  Procedure: Nail Debridement Rationale: pain  Type of Debridement: manual, sharp debridement. Instrumentation: Nail nipper, rotary burr. Number of Nails: 10  Return in about 3 months (around 11/30/2017).

## 2017-11-29 ENCOUNTER — Ambulatory Visit: Payer: Medicare Other | Admitting: Podiatry

## 2017-11-29 DIAGNOSIS — B351 Tinea unguium: Secondary | ICD-10-CM | POA: Diagnosis not present

## 2017-11-29 DIAGNOSIS — E119 Type 2 diabetes mellitus without complications: Secondary | ICD-10-CM

## 2017-11-29 DIAGNOSIS — Z9229 Personal history of other drug therapy: Secondary | ICD-10-CM

## 2017-11-29 DIAGNOSIS — M79674 Pain in right toe(s): Secondary | ICD-10-CM | POA: Diagnosis not present

## 2017-11-29 DIAGNOSIS — M79675 Pain in left toe(s): Secondary | ICD-10-CM | POA: Diagnosis not present

## 2017-11-29 NOTE — Patient Instructions (Signed)

## 2017-12-16 ENCOUNTER — Encounter: Payer: Self-pay | Admitting: Podiatry

## 2017-12-16 NOTE — Progress Notes (Signed)
Subjective: Gregg Flores is a 72 y.o. y.o. male who presents today for preventative foot care.  He presents with painful, discolored, thick toenails of both feet pain is aggravated when wearing enclosed shoe gear. Pain is relieved with periodic professional debridement.  Gregg Flores is also on blood thinner, Eliquis.  Objective: 72 year old African-American male well-developed, well-nourished in no acute distress.  Alert, awake and oriented x3.  Vascular Examination: Capillary refill time <3 seconds x 10 digits Dorsalis pedis pulses and Posterior tibial pulses present b/l No digital hair x 10 digits Skin temperature gradient within normal limits bilaterally  Dermatological Examination: Turgor, texture and tone normal b/l LE Toenails 1-5 b/l discolored, thick, dystrophic with subungual debris and pain with palpation to nailbeds due to thickness of nails. No open wounds. No interdigital maceration  Musculoskeletal: Muscle strength 5/5 to all LE muscle groups  Neurological: Sensation intact with 10 gram monofilament. Vibratory sensation intact.  Assessment: 1. Painful onychomycosis toenails 1-5 b/l in patient on long-term blood thinner, Eliquis  Plan: 1. Continue diabetic foot care principles.  Literature dispensed on today. 2. Toenails 1-5 b/l were debrided in length and girth without iatrogenic bleeding. 3. Patient to continue soft, supportive shoe gear 4. Patient to report any pedal injuries to medical professional immediately. 5. Avoid self trimming due to use of blood thinner. 6. Follow up 3 months.  7. Patient/POA to call should there be a concern in the interim.

## 2018-02-05 NOTE — Progress Notes (Signed)
CARDIOLOGY OFFICE NOTE  Date:  02/07/2018    [name] Date of Birth: 01-25-1946 Medical Record #[id number]  PCP:  [name], MD  Cardiologist:  Johnsie Cancel   No chief complaint on file.   History of Present Illness: Gregg Flores is a 73 y.o. male who presents today for a follow up HTN, PAF, HLD and dilated aortic root  Maintaining NSR post TEE/DCC 11/2015.  CHADVASC of 3 Had stomach upset with xarelto and turned down for patient assistance with eliquis Aortic root last measures 4.4 cm by CT 02/06/17  No palpitations or chest pain. One of his son's lives next to him has a daughter that He is estranged from Compliant with meds   Past Medical History:  Diagnosis Date  . BPH (benign prostatic hyperplasia)   . Cataracts, bilateral   . Chronic LBP   . Colon polyps   . Diverticulosis   . ED (erectile dysfunction)   . Essential hypertension, benign 1990  . Microalbuminuria   . Monoclonal gammopathy 04/2008  . Type II or unspecified type diabetes mellitus without mention of complication, not stated as uncontrolled     Past Surgical History:  Procedure Laterality Date  . CARDIOVERSION N/A 12/08/2015   Procedure: CARDIOVERSION;  Surgeon: Larey Dresser, MD;  Location: [facility] ENDOSCOPY;  Service: Cardiovascular;  Laterality: N/A;  . TEE WITHOUT CARDIOVERSION N/A 12/08/2015   Procedure: TRANSESOPHAGEAL ECHOCARDIOGRAM (TEE);  Surgeon: Larey Dresser, MD;  Location: [facility];  Service: Cardiovascular;  Laterality: N/A;  . TRANSURETHRAL RESECTION OF PROSTATE       Medications: Current Outpatient Medications  Medication Sig Dispense Refill  . amLODipine (NORVASC) 10 MG tablet take 1 tablet by mouth once daily 90 tablet 1  . apixaban (ELIQUIS) 5 MG TABS tablet Take 1 tablet (5 mg total) by mouth 2 (two) times daily. 180 tablet 3  . atorvastatin (LIPITOR) 40 MG tablet Take 1 tablet (40 mg total) by mouth daily. 90 tablet 3  . benazepril (LOTENSIN) 40 MG tablet take  1 tablet by mouth once daily 90 tablet 1  . diclofenac sodium (VOLTAREN) 1 % GEL Apply 2 g topically daily as needed. Do not use more than 2 times each week. 100 g 0  . hydrochlorothiazide (HYDRODIURIL) 25 MG tablet Take 1 tablet (25 mg total) by mouth every morning. 90 tablet 3  . HYDROcodone-acetaminophen (NORCO) 7.5-325 MG tablet Take 1 tablet by mouth 2 (two) times daily as needed. For pain 60 tablet 0  . ipratropium (ATROVENT) 0.03 % nasal spray Place 2 sprays into the nose every 12 (twelve) hours. 90 mL 3  . metFORMIN (GLUCOPHAGE) 1000 MG tablet take 1 tablet by mouth twice a day with food 180 tablet 1  . metoprolol tartrate (LOPRESSOR) 50 MG tablet take 1 tablet by mouth twice a day 180 tablet 1  . sitaGLIPtin (JANUVIA) 50 MG tablet Take 1 tablet (50 mg total) by mouth daily. 90 tablet 3   No current facility-administered medications for this visit.     Allergies: No Known Allergies  Social History: The patient  reports that he has been smoking cigarettes. He has a 43.00 pack-year smoking history. He has never used smokeless tobacco. He reports that he does not drink alcohol or use drugs.   Family History: The patient's family history includes Diabetes in his brother; Heart disease in his mother; Hypertension in his mother.   Review of Systems: Please see the history of present illness.  Otherwise, the review of systems is positive for none.   All other systems are reviewed and negative.   Physical Exam: VS:  BP 128/86   Pulse 73   Ht 5\' 11"  (1.803 m)   Wt 237 lb 8 oz (107.7 kg)   SpO2 98%   BMI 33.12 kg/m  .  BMI Body mass index is 33.12 kg/m.  Wt Readings from Last 3 Encounters:  02/07/18 237 lb 8 oz (107.7 kg)  02/06/17 245 lb (111.1 kg)  02/05/17 245 lb 9.6 oz (111.4 kg)   Affect appropriate Healthy:  appears stated age HEENT: normal Neck supple with no adenopathy JVP normal no bruits no thyromegaly Lungs clear with no wheezing and good diaphragmatic  motion Heart:  S1/S2 no murmur, no rub, gallop or click PMI normal Abdomen: benighn, BS positve, no tenderness, no AAA no bruit.  No HSM or HJR Distal pulses intact with no bruits No edema Neuro non-focal Skin warm and dry No muscular weakness    LABORATORY DATA:  EKG:  02/07/18 SR rate 60 LAD poor R wave progression   Lab Results  Component Value Date   WBC 5.0 02/05/2017   HGB 12.8 (L) 02/05/2017   HCT 38.5 02/05/2017   PLT 238 02/05/2017   GLUCOSE 116 (H) 02/05/2017   CHOL 124 02/05/2017   TRIG 127 02/05/2017   HDL 24 (L) 02/05/2017   LDLCALC 75 02/05/2017   ALT 13 02/05/2017   AST 12 02/05/2017   NA 140 02/05/2017   K 4.3 02/05/2017   CL 104 02/05/2017   CREATININE 0.82 02/05/2017   BUN 14 02/05/2017   CO2 18 (L) 02/05/2017   INR 1.1 11/24/2015   HGBA1C 7.0 (H) 02/05/2017   MICROALBUR 7.4 11/16/2014    BNP (last 3 results) No results for input(s): BNP in the last 8760 hours.  ProBNP (last 3 results) No results for input(s): PROBNP in the last 8760 hours.   Other Studies Reviewed Today: Procedure: TEE  Indication: Atrial fibrillation  Sedation: Per anesthesiology.  Findings: Please see echo section for full report.  The patient was in rapid atrial fibrillation.  Normal LV size with moderate LV hypertrophy.  EF 45%, diffuse hypokinesis.  Normal RV size and systolic function.  Mild left atrial enlargement, no LA appendage thrombus.  Normal right atrium.  Trileaflet aortic valve with no stenosis or regurgitation.  Trivial MR.  There appeared to be a loose chord attached to the anterior mitral leaflet.  However, there was only mild mitral regurgitation.  No PFO/ASD, negative bubble study.  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     May proceed to DCCV.  Would repeat echo in sinus rhythm to confirm EF.   Loralie Champagne 12/08/2015  ECHO 08/09/15 Study Conclusions  - Left ventricle: The  cavity size was normal. Systolic function was normal. The estimated ejection fraction was in the range of 55% to 60%. Wall motion was normal; there were no regional wall motion abnormalities. The study was not technically sufficient to allow evaluation of LV diastolic dysfunction due to atrial fibrillation. - Aortic valve: Trileaflet; mildly thickened, mildly calcified leaflets. Moderate focal calcification involving the noncoronary cusp. - Aorta: Aortic root dimension: 39 mm (ED). Ascending aorta diameter: 4.39 mm (ED). - Aortic root: The aortic root was mildly dilated. - Ascending aorta: The ascending aorta was mildly dilated. - Mitral valve: MIldly calcified chordae tendinae - Right ventricle: The cavity size  was mildly dilated. Wall thickness was normal.   Procedure: Electrical Cardioversion Indications:  Atrial Fibrillation  Procedure Details Consent: Risks of procedure as well as the alternatives and risks of each were explained to the (patient/caregiver).  Consent for procedure obtained. Time Out: Verified patient identification, verified procedure, site/side was marked, verified correct patient position, special equipment/implants available, medications/allergies/relevent history reviewed, required imaging and test results available.  Performed  Patient placed on cardiac monitor, pulse oximetry, supplemental oxygen as necessary.  Sedation given: Propofol per anesthesiology Pacer pads placed anterior and posterior chest.  Cardioverted 1 time(s).  Cardioverted at [place].  Evaluation Findings: Post procedure EKG shows: NSR Complications: None Patient did tolerate procedure well.   Loralie Champagne 12/08/2015, 12:35 PM  ASSESSMENT AND PLAN:  1. PAF - s/p cardioversion/TEE  12/08/15 - holding in NSR. No change with his current regimen.   2. HTN: Well controlled.  Continue current medications and low sodium Dash type diet.    3. HLD - on statin  LDL 75 normal LFTls   4. DM Discussed low carb diet.  Target hemoglobin A1c is 6.5 or less.  Continue current medications.   5. Dilated ascending aorta - 4.4 cm by CTA  02/06/17 will repeat   6. Chronic anticoagulation - would not use Mobic regularly. Suggested Tylenol. Continue Eliquis    Current medicines are reviewed with the patient today.  The patient does not have concerns regarding medicines other than what has been noted above.  The following changes have been made:  See above.  Labs/ tests ordered today include: BMET  CTA chest   No orders of the defined types were placed in this encounter.    Jenkins Rouge

## 2018-02-07 ENCOUNTER — Ambulatory Visit: Payer: Medicare Other | Admitting: Cardiovascular Disease

## 2018-02-07 ENCOUNTER — Encounter: Payer: Self-pay | Admitting: Cardiovascular Disease

## 2018-02-07 VITALS — BP 128/86 | HR 73 | Ht 71.0 in | Wt 237.5 lb

## 2018-02-07 DIAGNOSIS — I4819 Other persistent atrial fibrillation: Secondary | ICD-10-CM | POA: Diagnosis not present

## 2018-02-07 DIAGNOSIS — I10 Essential (primary) hypertension: Secondary | ICD-10-CM

## 2018-02-07 DIAGNOSIS — I712 Thoracic aortic aneurysm, without rupture, unspecified: Secondary | ICD-10-CM

## 2018-02-07 DIAGNOSIS — E782 Mixed hyperlipidemia: Secondary | ICD-10-CM | POA: Diagnosis not present

## 2018-02-07 LAB — BASIC METABOLIC PANEL
BUN/Creatinine Ratio: 17 (ref 10–24)
BUN: 18 mg/dL (ref 8–27)
CHLORIDE: 103 mmol/L (ref 96–106)
CO2: 19 mmol/L — AB (ref 20–29)
CREATININE: 1.04 mg/dL (ref 0.76–1.27)
Calcium: 9.6 mg/dL (ref 8.6–10.2)
GFR calc Af Amer: 82 mL/min/{1.73_m2} (ref >59)
GFR, EST NON AFRICAN AMERICAN: 71 mL/min/{1.73_m2} (ref >59)
Glucose: 168 mg/dL — ABNORMAL HIGH (ref 65–99)
POTASSIUM: 4 mmol/L (ref 3.5–5.2)
Sodium: 138 mmol/L (ref 134–144)

## 2018-02-07 MED ORDER — APIXABAN 5 MG PO TABS: 5.0000 mg | ORAL_TABLET | Freq: Two times a day (BID) | ORAL | 3 refills | Status: DC

## 2018-02-07 NOTE — Patient Instructions (Addendum)
Medication Instructions:   If you need a refill on your cardiac medications before your next appointment, please call your pharmacy.   Lab work: Your physician recommends that you have lab work today- BMET  If you have labs (blood work) drawn today and your tests are completely normal, you will receive your results only by: Marland Kitchen MyChart Message (if you have MyChart) OR . A paper copy in the mail If you have any lab test that is abnormal or we need to change your treatment, we will call you to review the results.  Testing/Procedures: Your physician has requested that you have cardiac CT. Cardiac computed tomography (CT) is a painless test that uses an x-ray machine to take clear, detailed pictures of your heart. For further information please visit [email or web address]. Please follow instruction sheet as given.  Follow-Up: At [facility], you and your health needs are our priority.  As part of our continuing mission to provide you with exceptional heart care, we have created designated Provider Care Teams.  These Care Teams include your primary Cardiologist (physician) and Advanced Practice Providers (APPs -  Physician Assistants and Nurse Practitioners) who all work together to provide you with the care you need, when you need it. You will need a follow up appointment in 6 months.  Please call our office 2 months in advance to schedule this appointment.  You may see Dr. Johnsie Cancel or one of the following Advanced Practice Providers on your designated Care Team:   Truitt Merle, NP Cecilie Kicks, NP . Kathyrn Drown, NP

## 2018-02-21 ENCOUNTER — Telehealth: Payer: Self-pay

## 2018-02-21 ENCOUNTER — Ambulatory Visit (INDEPENDENT_AMBULATORY_CARE_PROVIDER_SITE_OTHER)
Admission: RE | Admit: 2018-02-21 | Discharge: 2018-02-21 | Disposition: A | Discharge status: Home / Self Care | Payer: Medicare Other | Source: Ambulatory Visit | Attending: Cardiovascular Disease | Admitting: Cardiovascular Disease

## 2018-02-21 DIAGNOSIS — I10 Essential (primary) hypertension: Secondary | ICD-10-CM | POA: Diagnosis not present

## 2018-02-21 DIAGNOSIS — I712 Thoracic aortic aneurysm, without rupture, unspecified: Secondary | ICD-10-CM

## 2018-02-21 DIAGNOSIS — I77810 Thoracic aortic ectasia: Secondary | ICD-10-CM

## 2018-02-21 DIAGNOSIS — I4819 Other persistent atrial fibrillation: Secondary | ICD-10-CM | POA: Diagnosis not present

## 2018-02-21 DIAGNOSIS — E782 Mixed hyperlipidemia: Secondary | ICD-10-CM | POA: Diagnosis not present

## 2018-02-21 MED ORDER — IOPAMIDOL (ISOVUE-370) INJECTION 76%
100.0000 mL | Freq: Once | INTRAVENOUS | Status: AC | PRN
  Administered 2018-02-21: 100 mL via INTRAVENOUS

## 2018-02-21 NOTE — Telephone Encounter (Signed)
-----   Message from Josue Hector, MD sent at 02/21/2018 11:31 AM EST ----- Aorta 4.2 stable f/u CT in a year

## 2018-02-21 NOTE — Telephone Encounter (Signed)
Patient aware of CT results. Per Dr. Johnsie Cancel, Aorta 4.2 stable f/u CT in a year. Patient verbalized understanding. Will place order for CT in one year.

## 2018-03-06 ENCOUNTER — Encounter (INDEPENDENT_AMBULATORY_CARE_PROVIDER_SITE_OTHER): Payer: Medicare Other | Admitting: Ophthalmology

## 2018-03-06 DIAGNOSIS — H33301 Unspecified retinal break, right eye: Secondary | ICD-10-CM | POA: Diagnosis not present

## 2018-03-06 DIAGNOSIS — H43813 Vitreous degeneration, bilateral: Secondary | ICD-10-CM

## 2018-03-06 DIAGNOSIS — I10 Essential (primary) hypertension: Secondary | ICD-10-CM | POA: Diagnosis not present

## 2018-03-06 DIAGNOSIS — E113313 Type 2 diabetes mellitus with moderate nonproliferative diabetic retinopathy with macular edema, bilateral: Secondary | ICD-10-CM | POA: Diagnosis not present

## 2018-03-06 DIAGNOSIS — E11311 Type 2 diabetes mellitus with unspecified diabetic retinopathy with macular edema: Secondary | ICD-10-CM | POA: Diagnosis not present

## 2018-03-06 DIAGNOSIS — H35033 Hypertensive retinopathy, bilateral: Secondary | ICD-10-CM

## 2018-03-06 DIAGNOSIS — H2513 Age-related nuclear cataract, bilateral: Secondary | ICD-10-CM

## 2018-03-07 ENCOUNTER — Ambulatory Visit: Payer: Medicare Other | Admitting: Podiatry

## 2018-03-13 ENCOUNTER — Encounter (INDEPENDENT_AMBULATORY_CARE_PROVIDER_SITE_OTHER): Payer: Medicare Other | Admitting: Ophthalmology

## 2018-03-13 DIAGNOSIS — E11311 Type 2 diabetes mellitus with unspecified diabetic retinopathy with macular edema: Secondary | ICD-10-CM

## 2018-03-13 DIAGNOSIS — E113311 Type 2 diabetes mellitus with moderate nonproliferative diabetic retinopathy with macular edema, right eye: Secondary | ICD-10-CM

## 2018-03-13 DIAGNOSIS — H33301 Unspecified retinal break, right eye: Secondary | ICD-10-CM | POA: Diagnosis not present

## 2018-03-28 ENCOUNTER — Encounter: Payer: Self-pay | Admitting: Podiatry

## 2018-03-28 ENCOUNTER — Ambulatory Visit: Payer: Medicare Other | Admitting: Podiatry

## 2018-03-28 DIAGNOSIS — B351 Tinea unguium: Secondary | ICD-10-CM | POA: Diagnosis not present

## 2018-03-28 DIAGNOSIS — E1122 Type 2 diabetes mellitus with diabetic chronic kidney disease: Secondary | ICD-10-CM

## 2018-03-28 DIAGNOSIS — Z9229 Personal history of other drug therapy: Secondary | ICD-10-CM | POA: Diagnosis not present

## 2018-03-28 DIAGNOSIS — M79609 Pain in unspecified limb: Secondary | ICD-10-CM

## 2018-03-28 NOTE — Patient Instructions (Signed)
Ingrown Toenail An ingrown toenail occurs when the corner or sides of a toenail grow into the surrounding skin. This causes discomfort and pain. The big toe is most commonly affected, but any of the toes can be affected. If an ingrown toenail is not treated, it can become infected. What are the causes? This condition may be caused by:  Wearing shoes that are too small or tight.  An injury, such as stubbing your toe or having your toe stepped on.  Improper cutting or care of your toenails.  Having nail or foot abnormalities that were present from birth (congenital abnormalities), such as having a nail that is too big for your toe. What increases the risk? The following factors may make you more likely to develop ingrown toenails:  Age. Nails tend to get thicker with age, so ingrown nails are more common among older people.  Cutting your toenails incorrectly, such as cutting them very short or cutting them unevenly. An ingrown toenail is more likely to get infected if you have:  Diabetes.  Blood flow (circulation) problems. What are the signs or symptoms? Symptoms of an ingrown toenail may include:  Pain, soreness, or tenderness.  Redness.  Swelling.  Hardening of the skin that surrounds the toenail. Signs that an ingrown toenail may be infected include:  Fluid or pus.  Symptoms that get worse instead of better. How is this diagnosed? An ingrown toenail may be diagnosed based on your medical history, your symptoms, and a physical exam. If you have fluid or blood coming from your toenail, a sample may be collected to test for the specific type of bacteria that is causing the infection. How is this treated? Treatment depends on how severe your ingrown toenail is. You may be able to care for your toenail at home.  If you have an infection, you may be prescribed antibiotic medicines.  If you have fluid or pus draining from your toenail, your health care provider may drain  it.  If you have trouble walking, you may be given crutches to use.  If you have a severe or infected ingrown toenail, you may need a procedure to remove part or all of the nail. Follow these instructions at home: Foot care   Do not pick at your toenail or try to remove it yourself.  Soak your foot in warm, soapy water. Do this for 20 minutes, 3 times a day, or as often as told by your health care provider. This helps to keep your toe clean and keep your skin soft.  Wear shoes that fit well and are not too tight. Your health care provider may recommend that you wear open-toed shoes while you heal.  Trim your toenails regularly and carefully. Cut your toenails straight across to prevent injury to the skin at the corners of the toenail. Do not cut your nails in a curved shape.  Keep your feet clean and dry to help prevent infection. Medicines  Take over-the-counter and prescription medicines only as told by your health care provider.  If you were prescribed an antibiotic, take it as told by your health care provider. Do not stop taking the antibiotic even if you start to feel better. Activity  Return to your normal activities as told by your health care provider. Ask your health care provider what activities are safe for you.  Avoid activities that cause pain. General instructions  If your health care provider told you to use crutches to help you move around, use them   as instructed.  Keep all follow-up visits as told by your health care provider. This is important. Contact a health care provider if:  You have more redness, swelling, pain, or other symptoms that do not improve with treatment.  You have fluid, blood, or pus coming from your toenail. Get help right away if:  You have a red streak on your skin that starts at your foot and spreads up your leg.  You have a fever. Summary  An ingrown toenail occurs when the corner or sides of a toenail grow into the surrounding  skin. This causes discomfort and pain. The big toe is most commonly affected, but any of the toes can be affected.  If an ingrown toenail is not treated, it can become infected.  Fluid or pus draining from your toenail is a sign of infection. Your health care provider may need to drain it. You may be given antibiotics to treat the infection.  Trimming your toenails regularly and properly can help you prevent an ingrown toenail. This information is not intended to replace advice given to you by your health care provider. Make sure you discuss any questions you have with your health care provider. Document Released: 01/13/2000 Document Revised: 10/03/2016 Document Reviewed: 10/03/2016 Elsevier Interactive Patient Education  2019 [employer]  Onychomycosis/Fungal Toenails  WHAT IS IT? An infection that lies within the keratin of your nail plate that is caused by a fungus.  WHY ME? Fungal infections affect all ages, sexes, races, and creeds.  There may be many factors that predispose you to a fungal infection such as age, coexisting medical conditions such as diabetes, or an autoimmune disease; stress, medications, fatigue, genetics, etc.  Bottom line: fungus thrives in a warm, moist environment and your shoes offer such a location.  IS IT CONTAGIOUS? Theoretically, yes.  You do not want to share shoes, nail clippers or files with someone who has fungal toenails.  Walking around barefoot in the same room or sleeping in the same bed is unlikely to transfer the organism.  It is important to realize, however, that fungus can spread easily from one nail to the next on the same foot.  HOW DO WE TREAT THIS?  There are several ways to treat this condition.  Treatment may depend on many factors such as age, medications, pregnancy, liver and kidney conditions, etc.  It is best to ask your doctor which options are available to you.  1. No treatment.   Unlike many other medical concerns, you can live with  this condition.  However for many people this can be a painful condition and may lead to ingrown toenails or a bacterial infection.  It is recommended that you keep the nails cut short to help reduce the amount of fungal nail. 2. Topical treatment.  These range from herbal remedies to prescription strength nail lacquers.  About 40-50% effective, topicals require twice daily application for approximately 9 to 12 months or until an entirely new nail has grown out.  The most effective topicals are medical grade medications available through physicians offices. 3. Oral antifungal medications.  With an 80-90% cure rate, the most common oral medication requires 3 to 4 months of therapy and stays in your system for a year as the new nail grows out.  Oral antifungal medications do require blood work to make sure it is a safe drug for you.  A liver function panel will be performed prior to starting the medication and after the first month of  treatment.  It is important to have the blood work performed to avoid any harmful side effects.  In general, this medication safe but blood work is required. 4. Laser Therapy.  This treatment is performed by applying a specialized laser to the affected nail plate.  This therapy is noninvasive, fast, and non-painful.  It is not covered by insurance and is therefore, out of pocket.  The results have been very good with a 80-95% cure rate.  [facility] is the only practice in the area to offer this therapy. Permanent Nail Avulsion.  Removing the entire nail so that a new nail will not grow back.Diabetes Mellitus and Foot Care Foot care is an important part of your health, especially when you have diabetes. Diabetes may cause you to have problems because of poor blood flow (circulation) to your feet and legs, which can cause your skin to:  Become thinner and drier.  Break more easily.  Heal more slowly.  Peel and crack. You may also have nerve damage (neuropathy) in  your legs and feet, causing decreased feeling in them. This means that you may not notice minor injuries to your feet that could lead to more serious problems. Noticing and addressing any potential problems early is the best way to prevent future foot problems. How to care for your feet Foot hygiene  Wash your feet daily with warm water and mild soap. Do not use hot water. Then, pat your feet and the areas between your toes until they are completely dry. Do not soak your feet as this can dry your skin.  Trim your toenails straight across. Do not dig under them or around the cuticle. File the edges of your nails with an emery board or nail file.  Apply a moisturizing lotion or petroleum jelly to the skin on your feet and to dry, brittle toenails. Use lotion that does not contain alcohol and is unscented. Do not apply lotion between your toes. Shoes and socks  Wear clean socks or stockings every day. Make sure they are not too tight. Do not wear knee-high stockings since they may decrease blood flow to your legs.  Wear shoes that fit properly and have enough cushioning. Always look in your shoes before you put them on to be sure there are no objects inside.  To break in new shoes, wear them for just a few hours a day. This prevents injuries on your feet. Wounds, scrapes, corns, and calluses  Check your feet daily for blisters, cuts, bruises, sores, and redness. If you cannot see the bottom of your feet, use a mirror or ask someone for help.  Do not cut corns or calluses or try to remove them with medicine.  If you find a minor scrape, cut, or break in the skin on your feet, keep it and the skin around it clean and dry. You may clean these areas with mild soap and water. Do not clean the area with peroxide, alcohol, or iodine.  If you have a wound, scrape, corn, or callus on your foot, look at it several times a day to make sure it is healing and not infected. Check for: ? Redness, swelling, or  pain. ? Fluid or blood. ? Warmth. ? Pus or a bad smell. General instructions  Do not cross your legs. This may decrease blood flow to your feet.  Do not use heating pads or hot water bottles on your feet. They may burn your skin. If you have lost  feeling in your feet or legs, you may not know this is happening until it is too late.  Protect your feet from hot and cold by wearing shoes, such as at the beach or on hot pavement.  Schedule a complete foot exam at least once a year (annually) or more often if you have foot problems. If you have foot problems, report any cuts, sores, or bruises to your health care provider immediately. Contact a health care provider if:  You have a medical condition that increases your risk of infection and you have any cuts, sores, or bruises on your feet.  You have an injury that is not healing.  You have redness on your legs or feet.  You feel burning or tingling in your legs or feet.  You have pain or cramps in your legs and feet.  Your legs or feet are numb.  Your feet always feel cold.  You have pain around a toenail. Get help right away if:  You have a wound, scrape, corn, or callus on your foot and: ? You have pain, swelling, or redness that gets worse. ? You have fluid or blood coming from the wound, scrape, corn, or callus. ? Your wound, scrape, corn, or callus feels warm to the touch. ? You have pus or a bad smell coming from the wound, scrape, corn, or callus. ? You have a fever. ? You have a red line going up your leg. Summary  Check your feet every day for cuts, sores, red spots, swelling, and blisters.  Moisturize feet and legs daily.  Wear shoes that fit properly and have enough cushioning.  If you have foot problems, report any cuts, sores, or bruises to your health care provider immediately.  Schedule a complete foot exam at least once a year (annually) or more often if you have foot problems. This information is not  intended to replace advice given to you by your health care provider. Make sure you discuss any questions you have with your health care provider. Document Released: 01/13/2000 Document Revised: 02/27/2017 Document Reviewed: 02/17/2016 Elsevier Interactive Patient Education  2019 [employer].

## 2018-03-28 NOTE — Progress Notes (Signed)
Subjective: Gregg Flores is a 73 y.o. y.o. male who presents today with painful, discolored, thick toenails  which interfere with daily activities. Pain is aggravated when wearing enclosed shoe gear. Pain is relieved with periodic professional debridement.  He relates he is experiencing pain in the left great toe medial border.  Patient remains on blood thinner, Eliquis.   Patient's PCP is [name], MD.   Current Outpatient Medications:  .  amLODipine (NORVASC) 10 MG tablet, take 1 tablet by mouth once daily, Disp: 90 tablet, Rfl: 1 .  apixaban (ELIQUIS) 5 MG TABS tablet, Take 1 tablet (5 mg total) by mouth 2 (two) times daily., Disp: 180 tablet, Rfl: 3 .  atorvastatin (LIPITOR) 40 MG tablet, Take 1 tablet (40 mg total) by mouth daily., Disp: 90 tablet, Rfl: 3 .  benazepril (LOTENSIN) 40 MG tablet, take 1 tablet by mouth once daily, Disp: 90 tablet, Rfl: 1 .  BESIVANCE 0.6 % SUSP, INSTILL 1 DROP INTO RIGHT EYE 4 TIMES DAILY FOR 2 DAYS AFTER EACH MONTHLY EYE INJECTION, Disp: , Rfl:  .  diclofenac sodium (VOLTAREN) 1 % GEL, Apply 2 g topically daily as needed. Do not use more than 2 times each week., Disp: 100 g, Rfl: 0 .  FLUAD 0.5 ML SUSY, ADM 0.5ML IM UTD, Disp: , Rfl: 0 .  fluticasone (FLONASE) 50 MCG/ACT nasal spray, SHAKE LQ AND U 2 SPRAYS IEN D, Disp: , Rfl:  .  hydrochlorothiazide (HYDRODIURIL) 25 MG tablet, Take 1 tablet (25 mg total) by mouth every morning., Disp: 90 tablet, Rfl: 3 .  HYDROcodone-acetaminophen (NORCO) 7.5-325 MG tablet, Take 1 tablet by mouth 2 (two) times daily as needed. For pain, Disp: 60 tablet, Rfl: 0 .  ipratropium (ATROVENT) 0.03 % nasal spray, Place 2 sprays into the nose every 12 (twelve) hours., Disp: 90 mL, Rfl: 3 .  meloxicam (MOBIC) 7.5 MG tablet, TK 1 T PO BID PRF JOINT PAIN, Disp: , Rfl: 0 .  metFORMIN (GLUCOPHAGE) 1000 MG tablet, take 1 tablet by mouth twice a day with food, Disp: 180 tablet, Rfl: 1 .  metoprolol tartrate (LOPRESSOR) 50  MG tablet, take 1 tablet by mouth twice a day, Disp: 180 tablet, Rfl: 1 .  sitaGLIPtin (JANUVIA) 50 MG tablet, Take 1 tablet (50 mg total) by mouth daily., Disp: 90 tablet, Rfl: 3   No Known Allergies   Objective: Vascular Examination: Capillary refill time less than 3 seconds x 10 digits. Dorsalis pedis pulses palpable bilaterally. Posterior tibial pulses palpable bilaterally. No digital hair x 10 digits. Skin temperature gradient within normal limits bilaterally  Dermatological Examination: Skin warm and dry bilaterally.  No open wounds.  No interdigital macerations noted.  Toenails 1-5 b/l discolored, thick, dystrophic with subungual debris and pain with palpation to nailbeds due to thickness of nails.  Incurvated nail plate noted left great toe medial border.  There is no erythema, no edema, no drainage, no flocculence noted.  No nail border hypertrophy.  There is tenderness to palpation.   Musculoskeletal: Muscle strength 5/5 to all LE muscle groups  Neurological: Sensation intact with 10 gram monofilament.  Vibratory sensation intact.  Assessment: Painful onychomycosis toenails 1-5 b/l in patient on blood thinner.  Ingrown toenail left great toe, noninfected.  Plan: 1. Toenails 1-5 b/l were debrided in length and girth without iatrogenic bleeding.  Offending nail border left great toe debrided and curettage.  Border cleansed with alcohol.  Patient noted relief post debridement.  No further treatment required  by patient. 2. Patient to continue soft, supportive shoe gear. 3. Patient to report any pedal injuries to medical professional immediately. 4. Avoid self trimming due to use of blood thinner. 5. Follow up 3 months. 6.  Patient/POA to call should there be a concern in the interim.

## 2018-04-10 ENCOUNTER — Encounter (INDEPENDENT_AMBULATORY_CARE_PROVIDER_SITE_OTHER): Payer: Medicare Other | Admitting: Ophthalmology

## 2018-04-10 ENCOUNTER — Other Ambulatory Visit: Payer: Self-pay

## 2018-04-10 DIAGNOSIS — I10 Essential (primary) hypertension: Secondary | ICD-10-CM | POA: Diagnosis not present

## 2018-04-10 DIAGNOSIS — H43813 Vitreous degeneration, bilateral: Secondary | ICD-10-CM

## 2018-04-10 DIAGNOSIS — E113212 Type 2 diabetes mellitus with mild nonproliferative diabetic retinopathy with macular edema, left eye: Secondary | ICD-10-CM

## 2018-04-10 DIAGNOSIS — E113311 Type 2 diabetes mellitus with moderate nonproliferative diabetic retinopathy with macular edema, right eye: Secondary | ICD-10-CM | POA: Diagnosis not present

## 2018-04-10 DIAGNOSIS — H35033 Hypertensive retinopathy, bilateral: Secondary | ICD-10-CM

## 2018-04-10 DIAGNOSIS — H2513 Age-related nuclear cataract, bilateral: Secondary | ICD-10-CM

## 2018-04-10 DIAGNOSIS — E11311 Type 2 diabetes mellitus with unspecified diabetic retinopathy with macular edema: Secondary | ICD-10-CM | POA: Diagnosis not present

## 2018-04-10 DIAGNOSIS — H33301 Unspecified retinal break, right eye: Secondary | ICD-10-CM

## 2018-05-15 ENCOUNTER — Encounter (INDEPENDENT_AMBULATORY_CARE_PROVIDER_SITE_OTHER): Payer: Medicare Other | Admitting: Ophthalmology

## 2018-06-27 ENCOUNTER — Encounter: Payer: Self-pay | Admitting: Podiatry

## 2018-06-27 ENCOUNTER — Ambulatory Visit: Payer: Medicare Other | Admitting: Podiatry

## 2018-06-27 ENCOUNTER — Other Ambulatory Visit: Payer: Self-pay

## 2018-06-27 VITALS — Temp 97.2°F

## 2018-06-27 DIAGNOSIS — B351 Tinea unguium: Secondary | ICD-10-CM | POA: Diagnosis not present

## 2018-06-27 DIAGNOSIS — Z9229 Personal history of other drug therapy: Secondary | ICD-10-CM

## 2018-06-27 DIAGNOSIS — M79676 Pain in unspecified toe(s): Secondary | ICD-10-CM

## 2018-06-27 DIAGNOSIS — L600 Ingrowing nail: Secondary | ICD-10-CM

## 2018-06-27 NOTE — Patient Instructions (Signed)
Diabetes Mellitus and Foot Care Foot care is an important part of your health, especially when you have diabetes. Diabetes may cause you to have problems because of poor blood flow (circulation) to your feet and legs, which can cause your skin to:  Become thinner and drier.  Break more easily.  Heal more slowly.  Peel and crack. You may also have nerve damage (neuropathy) in your legs and feet, causing decreased feeling in them. This means that you may not notice minor injuries to your feet that could lead to more serious problems. Noticing and addressing any potential problems early is the best way to prevent future foot problems. How to care for your feet Foot hygiene  Wash your feet daily with warm water and mild soap. Do not use hot water. Then, pat your feet and the areas between your toes until they are completely dry. Do not soak your feet as this can dry your skin.  Trim your toenails straight across. Do not dig under them or around the cuticle. File the edges of your nails with an emery board or nail file.  Apply a moisturizing lotion or petroleum jelly to the skin on your feet and to dry, brittle toenails. Use lotion that does not contain alcohol and is unscented. Do not apply lotion between your toes. Shoes and socks  Wear clean socks or stockings every day. Make sure they are not too tight. Do not wear knee-high stockings since they may decrease blood flow to your legs.  Wear shoes that fit properly and have enough cushioning. Always look in your shoes before you put them on to be sure there are no objects inside.  To break in new shoes, wear them for just a few hours a day. This prevents injuries on your feet. Wounds, scrapes, corns, and calluses  Check your feet daily for blisters, cuts, bruises, sores, and redness. If you cannot see the bottom of your feet, use a mirror or ask someone for help.  Do not cut corns or calluses or try to remove them with medicine.  If you  find a minor scrape, cut, or break in the skin on your feet, keep it and the skin around it clean and dry. You may clean these areas with mild soap and water. Do not clean the area with peroxide, alcohol, or iodine.  If you have a wound, scrape, corn, or callus on your foot, look at it several times a day to make sure it is healing and not infected. Check for: ? Redness, swelling, or pain. ? Fluid or blood. ? Warmth. ? Pus or a bad smell. General instructions  Do not cross your legs. This may decrease blood flow to your feet.  Do not use heating pads or hot water bottles on your feet. They may burn your skin. If you have lost feeling in your feet or legs, you may not know this is happening until it is too late.  Protect your feet from hot and cold by wearing shoes, such as at the beach or on hot pavement.  Schedule a complete foot exam at least once a year (annually) or more often if you have foot problems. If you have foot problems, report any cuts, sores, or bruises to your health care provider immediately. Contact a health care provider if:  You have a medical condition that increases your risk of infection and you have any cuts, sores, or bruises on your feet.  You have an injury that is not   healing.  You have redness on your legs or feet.  You feel burning or tingling in your legs or feet.  You have pain or cramps in your legs and feet.  Your legs or feet are numb.  Your feet always feel cold.  You have pain around a toenail. Get help right away if:  You have a wound, scrape, corn, or callus on your foot and: ? You have pain, swelling, or redness that gets worse. ? You have fluid or blood coming from the wound, scrape, corn, or callus. ? Your wound, scrape, corn, or callus feels warm to the touch. ? You have pus or a bad smell coming from the wound, scrape, corn, or callus. ? You have a fever. ? You have a red line going up your leg. Summary  Check your feet every day  for cuts, sores, red spots, swelling, and blisters.  Moisturize feet and legs daily.  Wear shoes that fit properly and have enough cushioning.  If you have foot problems, report any cuts, sores, or bruises to your health care provider immediately.  Schedule a complete foot exam at least once a year (annually) or more often if you have foot problems. This information is not intended to replace advice given to you by your health care provider. Make sure you discuss any questions you have with your health care provider. Document Released: 01/13/2000 Document Revised: 02/27/2017 Document Reviewed: 02/17/2016 Elsevier Interactive Patient Education  2019 [employer]  Onychomycosis/Fungal Toenails  WHAT IS IT? An infection that lies within the keratin of your nail plate that is caused by a fungus.  WHY ME? Fungal infections affect all ages, sexes, races, and creeds.  There may be many factors that predispose you to a fungal infection such as age, coexisting medical conditions such as diabetes, or an autoimmune disease; stress, medications, fatigue, genetics, etc.  Bottom line: fungus thrives in a warm, moist environment and your shoes offer such a location.  IS IT CONTAGIOUS? Theoretically, yes.  You do not want to share shoes, nail clippers or files with someone who has fungal toenails.  Walking around barefoot in the same room or sleeping in the same bed is unlikely to transfer the organism.  It is important to realize, however, that fungus can spread easily from one nail to the next on the same foot.  HOW DO WE TREAT THIS?  There are several ways to treat this condition.  Treatment may depend on many factors such as age, medications, pregnancy, liver and kidney conditions, etc.  It is best to ask your doctor which options are available to you.  1. No treatment.   Unlike many other medical concerns, you can live with this condition.  However for many people this can be a painful condition and  may lead to ingrown toenails or a bacterial infection.  It is recommended that you keep the nails cut short to help reduce the amount of fungal nail. 2. Topical treatment.  These range from herbal remedies to prescription strength nail lacquers.  About 40-50% effective, topicals require twice daily application for approximately 9 to 12 months or until an entirely new nail has grown out.  The most effective topicals are medical grade medications available through physicians offices. 3. Oral antifungal medications.  With an 80-90% cure rate, the most common oral medication requires 3 to 4 months of therapy and stays in your system for a year as the new nail grows out.  Oral antifungal medications do require   blood work to make sure it is a safe drug for you.  A liver function panel will be performed prior to starting the medication and after the first month of treatment.  It is important to have the blood work performed to avoid any harmful side effects.  In general, this medication safe but blood work is required. 4. Laser Therapy.  This treatment is performed by applying a specialized laser to the affected nail plate.  This therapy is noninvasive, fast, and non-painful.  It is not covered by insurance and is therefore, out of pocket.  The results have been very good with a 80-95% cure rate.  [facility] is the only practice in the area to offer this therapy. 5. Permanent Nail Avulsion.  Removing the entire nail so that a new nail will not grow back. 

## 2018-06-29 ENCOUNTER — Encounter: Payer: Self-pay | Admitting: Podiatry

## 2018-06-29 NOTE — Progress Notes (Signed)
Subjective:  Gregg Flores presents to clinic today with cc of  painful, thick, discolored, elongated toenails 1-5 b/l that become tender and cannot cut because of thickness. Pain is aggravated when wearing enclosed shoe gear.  [name], MD is his PCP.   He is also on blood thinner, Eliquis.   Current Outpatient Medications:  .  amLODipine (NORVASC) 10 MG tablet, take 1 tablet by mouth once daily, Disp: 90 tablet, Rfl: 1 .  apixaban (ELIQUIS) 5 MG TABS tablet, Take 1 tablet (5 mg total) by mouth 2 (two) times daily., Disp: 180 tablet, Rfl: 3 .  atorvastatin (LIPITOR) 40 MG tablet, Take 1 tablet (40 mg total) by mouth daily., Disp: 90 tablet, Rfl: 3 .  benazepril (LOTENSIN) 40 MG tablet, take 1 tablet by mouth once daily, Disp: 90 tablet, Rfl: 1 .  BESIVANCE 0.6 % SUSP, INSTILL 1 DROP INTO RIGHT EYE 4 TIMES DAILY FOR 2 DAYS AFTER EACH MONTHLY EYE INJECTION, Disp: , Rfl:  .  diclofenac sodium (VOLTAREN) 1 % GEL, Apply 2 g topically daily as needed. Do not use more than 2 times each week., Disp: 100 g, Rfl: 0 .  FLUAD 0.5 ML SUSY, ADM 0.5ML IM UTD, Disp: , Rfl: 0 .  fluticasone (FLONASE) 50 MCG/ACT nasal spray, SHAKE LQ AND U 2 SPRAYS IEN D, Disp: , Rfl:  .  hydrochlorothiazide (HYDRODIURIL) 25 MG tablet, Take 1 tablet (25 mg total) by mouth every morning., Disp: 90 tablet, Rfl: 3 .  HYDROcodone-acetaminophen (NORCO) 7.5-325 MG tablet, Take 1 tablet by mouth 2 (two) times daily as needed. For pain, Disp: 60 tablet, Rfl: 0 .  ipratropium (ATROVENT) 0.03 % nasal spray, Place 2 sprays into the nose every 12 (twelve) hours., Disp: 90 mL, Rfl: 3 .  meloxicam (MOBIC) 7.5 MG tablet, TK 1 T PO BID PRF JOINT PAIN, Disp: , Rfl: 0 .  metFORMIN (GLUCOPHAGE) 1000 MG tablet, take 1 tablet by mouth twice a day with food, Disp: 180 tablet, Rfl: 1 .  metoprolol tartrate (LOPRESSOR) 50 MG tablet, take 1 tablet by mouth twice a day, Disp: 180 tablet, Rfl: 1 .  sitaGLIPtin (JANUVIA) 50 MG tablet, Take 1  tablet (50 mg total) by mouth daily., Disp: 90 tablet, Rfl: 3 .  trimethoprim-polymyxin b (POLYTRIM) ophthalmic solution, INT 1 GTT INTO OD QID FOR 2 DAYS AFTER EACH MONTHLY EYE INJECTION, Disp: , Rfl:    No Known Allergies   Objective: Vitals:   06/27/18 0752  Temp: (!) 97.2 F (36.2 C)    Physical Examination:  Vascular Examination: Capillary refill time less than 3 seconds x 10 digits.  Palpable DP/PT pulses b/l.  Digital hair absent  b/l.  No edema noted b/l.  Skin temperature gradient WNL b/l.  Dermatological Examination: Skin with normal turgor, texture and tone b/l.  No open wounds b/l.  No interdigital macerations noted b/l.  Elongated, thick, discolored brittle toenails with subungual debris and pain on dorsal palpation of nailbeds 1-5 b/l.  Incurvated nailplate left great toe medial border with tenderness to palpation. No erythema, no edema, no drainage noted.  Musculoskeletal Examination: Muscle strength 5/5 to all muscle groups b/l.  No pain, crepitus or joint discomfort with active/passive ROM.  Neurological Examination: Sensation intact 5/5 b/l with 10 gram monofilament.  Vibratory sensation intact b/l.  Proprioceptive sensation intact b/l.  Assessment: Mycotic nail infection with pain 1-5 b/l Ingrown toenail left hallux medial border, noninfected  Plan: 1. Toenails 1-5 b/l were debrided in length  and girth without iatrogenic laceration. Offending nail border debrided and curretaged left great toe. Border cleansed with alcohol. Antibiotic ointment applied. No further treatment required by patient. 2.  Continue soft, supportive shoe gear daily. 3.  Report any pedal injuries to medical professional. 4.  Follow up 3 months. 5.  Patient/POA to call should there be a question/concern in there interim.

## 2018-08-22 ENCOUNTER — Telehealth: Payer: Self-pay | Admitting: *Deleted

## 2018-08-22 NOTE — Telephone Encounter (Signed)

## 2018-08-22 NOTE — Progress Notes (Signed)
CARDIOLOGY OFFICE NOTE  Date:  08/25/2018    [name] Date of Birth: 27-Nov-1945 Medical Record #[id number]  PCP:  [name], MD  Cardiologist:  Johnsie Cancel   Chief Complaint  Patient presents with  . Follow-up    Seen for Dr. Johnsie Cancel    History of Present Illness: AQUILES RUFFINI is a 73 y.o. male who presents today for a 6 month check. Seen for Dr. Johnsie Cancel.   He has a history of HTN, PAF, HLD and a dilated aortic root. He has had prior TEE/DCC back in 2017. CHADSVASC of at least 3. Stomach upset with Xarelto and turned down for patient assistance with Eliquis but remains on Eliquis. Last measurement of aortic root from 01/2017 - measuring 4.4 cm.   Last seen in January 2020 by Dr. Johnsie Cancel. Felt to be stable.   The patient does not have symptoms concerning for COVID-19 infection (fever, chills, cough, or new shortness of breath).   Comes in today. Here alone. Smoking. Medicines little unclear. Says "I just take them". Looks like PCP has filled some earlier today. He feels like he is doing ok. No palpitations. No falls noted. No chest pain "other than gas" due to liking beans. Not short of breath. He has been trying to stay in with the pandemic- watching TV most of the day. Walking some - but not much. He has not been cutting grass due to the extreme heat. He has no real concerns today. He is smoking.   Past Medical History:  Diagnosis Date  . BPH (benign prostatic hyperplasia)   . Cataracts, bilateral   . Chronic LBP   . Colon polyps   . Diverticulosis   . ED (erectile dysfunction)   . Essential hypertension, benign 1990  . Microalbuminuria   . Monoclonal gammopathy 04/2008  . Type II or unspecified type diabetes mellitus without mention of complication, not stated as uncontrolled     Past Surgical History:  Procedure Laterality Date  . CARDIOVERSION N/A 12/08/2015   Procedure: CARDIOVERSION;  Surgeon: Larey Dresser, MD;  Location: [facility] ENDOSCOPY;  Service:  Cardiovascular;  Laterality: N/A;  . TEE WITHOUT CARDIOVERSION N/A 12/08/2015   Procedure: TRANSESOPHAGEAL ECHOCARDIOGRAM (TEE);  Surgeon: Larey Dresser, MD;  Location: [place];  Service: Cardiovascular;  Laterality: N/A;  . TRANSURETHRAL RESECTION OF PROSTATE       Medications: Current Meds  Medication Sig  . amLODipine (NORVASC) 10 MG tablet take 1 tablet by mouth once daily  . apixaban (ELIQUIS) 5 MG TABS tablet Take 1 tablet (5 mg total) by mouth 2 (two) times daily.  Marland Kitchen atorvastatin (LIPITOR) 40 MG tablet Take 40 mg by mouth daily.  . benazepril (LOTENSIN) 40 MG tablet take 1 tablet by mouth once daily  . BESIVANCE 0.6 % SUSP INSTILL 1 DROP INTO RIGHT EYE 4 TIMES DAILY FOR 2 DAYS AFTER EACH MONTHLY EYE INJECTION  . diclofenac sodium (VOLTAREN) 1 % GEL Apply 2 g topically daily as needed. Do not use more than 2 times each week.  . fluticasone (FLONASE) 50 MCG/ACT nasal spray Place 2 sprays into both nostrils daily.   . hydrochlorothiazide (HYDRODIURIL) 25 MG tablet Take 1 tablet (25 mg total) by mouth every morning.  Marland Kitchen HYDROcodone-acetaminophen (NORCO) 7.5-325 MG tablet Take 1 tablet by mouth 2 (two) times daily as needed. For pain  . ipratropium (ATROVENT) 0.03 % nasal spray Place 2 sprays into the nose every 12 (twelve) hours.  . metFORMIN (GLUCOPHAGE) 1000  MG tablet take 1 tablet by mouth twice a day with food  . metoprolol tartrate (LOPRESSOR) 50 MG tablet take 1 tablet by mouth twice a day  . sitaGLIPtin (JANUVIA) 50 MG tablet Take 50 mg by mouth daily.     Allergies: No Known Allergies  Social History: The patient  reports that he has been smoking cigarettes. He has a 43.00 pack-year smoking history. He has never used smokeless tobacco. He reports that he does not drink alcohol or use drugs.   Family History: The patient's family history includes Diabetes in his brother; Heart disease in his mother; Hypertension in his mother.   Review of Systems: Please see the  history of present illness.   All other systems are reviewed and negative.   Physical Exam: VS:  BP 128/84 (BP Location: Left Arm, Patient Position: Sitting, Cuff Size: Large)   Pulse (!) 50   Ht 5\' 9"  (1.753 m)   Wt 236 lb 6.4 oz (107.2 kg)   SpO2 100% Comment: at rest  BMI 34.91 kg/m  .  BMI Body mass index is 34.91 kg/m.  Wt Readings from Last 3 Encounters:  08/25/18 236 lb 6.4 oz (107.2 kg)  02/07/18 237 lb 8 oz (107.7 kg)  02/06/17 245 lb (111.1 kg)    General: Pleasant. Obese. Alert and in no acute distress.   HEENT: Normal.  Neck: Supple, no JVD, carotid bruits, or masses noted.  Cardiac: Irregular rhythm. Rate is ok. Heart tones are distant.  He has no edema.  Respiratory:  Lungs are clear to auscultation bilaterally with normal work of breathing.  GI: Soft and nontender.  MS: No deformity or atrophy. Gait and ROM intact.  Skin: Warm and dry. Color is normal.  Neuro:  Strength and sensation are intact and no gross focal deficits noted.  Psych: Alert, appropriate and with normal affect.   LABORATORY DATA:  EKG:  EKG is ordered today. This demonstrates sinus bradycardia with marked sinus arrhythmia. HR is 57.   Lab Results  Component Value Date   WBC 5.0 02/05/2017   HGB 12.8 (L) 02/05/2017   HCT 38.5 02/05/2017   PLT 238 02/05/2017   GLUCOSE 168 (H) 02/07/2018   CHOL 124 02/05/2017   TRIG 127 02/05/2017   HDL 24 (L) 02/05/2017   LDLCALC 75 02/05/2017   ALT 13 02/05/2017   AST 12 02/05/2017   NA 138 02/07/2018   K 4.0 02/07/2018   CL 103 02/07/2018   CREATININE 1.04 02/07/2018   BUN 18 02/07/2018   CO2 19 (L) 02/07/2018   INR 1.1 11/24/2015   HGBA1C 7.0 (H) 02/05/2017   MICROALBUR 7.4 11/16/2014     BNP (last 3 results) No results for input(s): BNP in the last 8760 hours.  ProBNP (last 3 results) No results for input(s): PROBNP in the last 8760 hours.   Other Studies Reviewed Today:  CTA CHEST IMPRESSION 01/2018: Aneurysmal dilatation of the  ascending aorta is not significantly changed at 4.2 cm. Recommend annual imaging followup by CTA or MRA. This recommendation follows 2010 ACCF/AHA/AATS/ACR/ASA/SCA/SCAI/SIR/STS/SVM Guidelines for the Diagnosis and Management of Patients with Thoracic Aortic Disease. Circulation. 2010; 121: O350-K938. Aortic aneurysm NOS (ICD10-I71.9)  Aortic Atherosclerosis (ICD10-I70.0) and Emphysema (ICD10-J43.9).   Electronically Signed   By: Marybelle Killings M.D.   On: 02/21/2018 09:33    Procedure: TEE 2017 Findings: Please see echo section for full report. The patient was in rapid atrial fibrillation. Normal LV size with moderate LV hypertrophy. EF 45%, diffuse  hypokinesis. Normal RV size and systolic function. Mild left atrial enlargement, no LA appendage thrombus. Normal right atrium. Trileaflet aortic valve with no stenosis or regurgitation. Trivial MR. There appeared to be a loose chord attached to the anterior mitral leaflet. However, there was only mild mitral regurgitation. No PFO/ASD, negative bubble study. 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    May proceed to DCCV. Would repeat echo in sinus rhythm to confirm EF.   Loralie Champagne 12/08/2015   ECHO 08/09/15 Study Conclusions  - Left ventricle: The cavity size was normal. Systolic function was normal. The estimated ejection fraction was in the range of 55% to 60%. Wall motion was normal; there were no regional wall motion abnormalities. The study was not technically sufficient to allow evaluation of LV diastolic dysfunction due to atrial fibrillation. - Aortic valve: Trileaflet; mildly thickened, mildly calcified leaflets. Moderate focal calcification involving the noncoronary cusp. - Aorta: Aortic root dimension: 39 mm (ED). Ascending aorta diameter: 4.39 mm (ED). - Aortic root: The aortic root was mildly dilated. - Ascending  aorta: The ascending aorta was mildly dilated. - Mitral valve: MIldly calcified chordae tendinae - Right ventricle: The cavity size was mildly dilated. Wall thickness was normal.   Procedure: Electrical Cardioversion 2017 Indications:Atrial Fibrillation  Procedure Details Consent: Risks of procedure as well as the alternatives and risks of each were explained to the (patient/caregiver). Consent for procedure obtained. Time PYP:[id number] patient identification, verified procedure, site/side was marked, verified correct patient position, special equipment/implants available, medications/allergies/relevent history reviewed, required imaging and test results available. Performed  Patient placed on cardiac monitor, pulse oximetry, supplemental oxygen as necessary.  Sedation given: Propofol per anesthesiology Pacer pads placed anterior and posterior chest.  Cardioverted 1time(s).  Cardioverted at [place].  Evaluation Findings: Post procedure EKG shows: NSR Complications: None Patient didtolerate procedure well.   Loralie Champagne 12/08/2015, 12:35 PM   Assessment & Plan:  1. PAF - s/p cardioversion/TEE  12/08/15 - pulse irregular on exam - will check EKG  2. HTN: BP is ok today - no changes made.   3. HLD - on statin - looks like this was refilled by PCP earlier today. Needs labs.    4. DM - per PCP  5. Dilated ascending aorta - last study was in 01/2018 - needs annual follow up. Has good BP control.   6. Chronic anticoagulation - already advised to not use Mobic regularly. Tylenol again suggested. He has Voltaren gel as well. No bleeding/bruising noted. Surveillance lab today.   7. Tobacco abuse - total cessation encouraged.   8. COVID-19 Education: The signs and symptoms of COVID-19 were discussed with the patient and how to seek care for testing (follow up with PCP or arrange E-visit).  The importance of social distancing, staying at home, hand hygiene and  wearing a mask when out in public were discussed today.  Current medicines are reviewed with the patient today.  The patient does not have concerns regarding medicines other than what has been noted above.  The following changes have been made:  See above.  Labs/ tests ordered today include:    Orders Placed This Encounter  Procedures  . Basic metabolic panel  . CBC  . Hepatic function panel  . Lipid panel  . TSH  . EKG 12-Lead     Disposition:   FU with [place] in about 6 months.    Patient is agreeable to this plan and will  call if any problems develop in the interim.   SignedTruitt Merle, NP  08/25/2018 8:56 AM  [facility] [place] [address] [place], [place]  [address] Phone: [phone] Fax: [phone]

## 2018-08-25 ENCOUNTER — Ambulatory Visit (INDEPENDENT_AMBULATORY_CARE_PROVIDER_SITE_OTHER): Payer: Medicare Other | Admitting: Nurse Practitioner

## 2018-08-25 ENCOUNTER — Encounter: Payer: Self-pay | Admitting: Nurse Practitioner

## 2018-08-25 ENCOUNTER — Other Ambulatory Visit: Payer: Self-pay

## 2018-08-25 VITALS — BP 128/84 | HR 50 | Ht 69.0 in | Wt 236.4 lb

## 2018-08-25 DIAGNOSIS — Z7901 Long term (current) use of anticoagulants: Secondary | ICD-10-CM

## 2018-08-25 DIAGNOSIS — I10 Essential (primary) hypertension: Secondary | ICD-10-CM | POA: Diagnosis not present

## 2018-08-25 DIAGNOSIS — I480 Paroxysmal atrial fibrillation: Secondary | ICD-10-CM

## 2018-08-25 DIAGNOSIS — I712 Thoracic aortic aneurysm, without rupture, unspecified: Secondary | ICD-10-CM

## 2018-08-25 DIAGNOSIS — I77810 Thoracic aortic ectasia: Secondary | ICD-10-CM | POA: Diagnosis not present

## 2018-08-25 DIAGNOSIS — I4819 Other persistent atrial fibrillation: Secondary | ICD-10-CM | POA: Diagnosis not present

## 2018-08-25 DIAGNOSIS — E782 Mixed hyperlipidemia: Secondary | ICD-10-CM

## 2018-08-25 LAB — CBC
Hematocrit: 41.5 % (ref 37.5–51.0)
Hemoglobin: 13.9 g/dL (ref 13.0–17.7)
MCH: 28 pg (ref 26.6–33.0)
MCHC: 33.5 g/dL (ref 31.5–35.7)
MCV: 84 fL (ref 79–97)
Platelets: 236 10*3/uL (ref 150–450)
RBC: 4.96 x10E6/uL (ref 4.14–5.80)
RDW: 13.7 % (ref 11.6–15.4)
WBC: 5.7 10*3/uL (ref 3.4–10.8)

## 2018-08-25 LAB — HEPATIC FUNCTION PANEL
ALT: 17 IU/L (ref 0–44)
AST: 18 IU/L (ref 0–40)
Albumin: 4.6 g/dL (ref 3.7–4.7)
Alkaline Phosphatase: 86 IU/L (ref 39–117)
Bilirubin Total: 0.3 mg/dL (ref 0.0–1.2)
Bilirubin, Direct: 0.1 mg/dL (ref 0.00–0.40)
Total Protein: 7.5 g/dL (ref 6.0–8.5)

## 2018-08-25 LAB — LIPID PANEL
Chol/HDL Ratio: 4 ratio (ref 0.0–5.0)
Cholesterol, Total: 108 mg/dL (ref 100–199)
HDL: 27 mg/dL — ABNORMAL LOW (ref >39)
LDL Calculated: 62 mg/dL (ref 0–99)
Triglycerides: 97 mg/dL (ref 0–149)
VLDL Cholesterol Cal: 19 mg/dL (ref 5–40)

## 2018-08-25 LAB — BASIC METABOLIC PANEL
BUN/Creatinine Ratio: 15 (ref 10–24)
BUN: 13 mg/dL (ref 8–27)
CO2: 18 mmol/L — ABNORMAL LOW (ref 20–29)
Calcium: 9.7 mg/dL (ref 8.6–10.2)
Chloride: 101 mmol/L (ref 96–106)
Creatinine, Ser: 0.87 mg/dL (ref 0.76–1.27)
GFR calc Af Amer: 99 mL/min/{1.73_m2} (ref >59)
GFR calc non Af Amer: 86 mL/min/{1.73_m2} (ref >59)
Glucose: 129 mg/dL — ABNORMAL HIGH (ref 65–99)
Potassium: 4.2 mmol/L (ref 3.5–5.2)
Sodium: 139 mmol/L (ref 134–144)

## 2018-08-25 LAB — TSH: TSH: 1.45 u[IU]/mL (ref 0.450–4.500)

## 2018-08-25 NOTE — Patient Instructions (Addendum)
After Visit Summary:  We will be checking the following labs today - BMET, CBC, HPF, Lipids and TSH   Medication Instructions:    Continue with your current medicines.    If you need a refill on your cardiac medications before your next appointment, please call your pharmacy.     Testing/Procedures To Be Arranged:  N/A  Follow-Up:   See [place] in 6 months.     At [facility], you and your health needs are our priority.  As part of our continuing mission to provide you with exceptional heart care, we have created designated Provider Care Teams.  These Care Teams include your primary Cardiologist (physician) and Advanced Practice Providers (APPs -  Physician Assistants and Nurse Practitioners) who all work together to provide you with the care you need, when you need it.  Special Instructions:  . Stay safe, stay home, wash your hands for at least 20 seconds and wear a mask when out in public.  . It was good to talk with you today.  . No smoking. . Try to stop the smoking . Try to walk more.    Call the [place] office at [phone] if you have any questions, problems or concerns.

## 2018-09-06 ENCOUNTER — Other Ambulatory Visit: Payer: Self-pay

## 2018-09-06 ENCOUNTER — Emergency Department (HOSPITAL_COMMUNITY)
Admission: EM | Admit: 2018-09-06 | Discharge: 2018-09-06 | Disposition: A | Discharge status: Home / Self Care | Payer: Medicare Other | Attending: Emergency Medicine | Admitting: Emergency Medicine

## 2018-09-06 ENCOUNTER — Encounter (HOSPITAL_COMMUNITY): Payer: Self-pay

## 2018-09-06 DIAGNOSIS — Z7984 Long term (current) use of oral hypoglycemic drugs: Secondary | ICD-10-CM | POA: Insufficient documentation

## 2018-09-06 DIAGNOSIS — R310 Gross hematuria: Secondary | ICD-10-CM

## 2018-09-06 DIAGNOSIS — F17210 Nicotine dependence, cigarettes, uncomplicated: Secondary | ICD-10-CM | POA: Insufficient documentation

## 2018-09-06 DIAGNOSIS — Z7901 Long term (current) use of anticoagulants: Secondary | ICD-10-CM | POA: Diagnosis not present

## 2018-09-06 DIAGNOSIS — E119 Type 2 diabetes mellitus without complications: Secondary | ICD-10-CM | POA: Insufficient documentation

## 2018-09-06 DIAGNOSIS — Z79899 Other long term (current) drug therapy: Secondary | ICD-10-CM | POA: Diagnosis not present

## 2018-09-06 DIAGNOSIS — I10 Essential (primary) hypertension: Secondary | ICD-10-CM | POA: Insufficient documentation

## 2018-09-06 DIAGNOSIS — R319 Hematuria, unspecified: Secondary | ICD-10-CM | POA: Diagnosis present

## 2018-09-06 LAB — COMPREHENSIVE METABOLIC PANEL
ALT: 18 U/L (ref 0–44)
AST: 21 U/L (ref 15–41)
Albumin: 4.2 g/dL (ref 3.5–5.0)
Alkaline Phosphatase: 66 U/L (ref 38–126)
Anion gap: 13 (ref 5–15)
BUN: 17 mg/dL (ref 8–23)
CO2: 21 mmol/L — ABNORMAL LOW (ref 22–32)
Calcium: 9.5 mg/dL (ref 8.9–10.3)
Chloride: 108 mmol/L (ref 98–111)
Creatinine, Ser: 0.9 mg/dL (ref 0.61–1.24)
GFR calc Af Amer: >60 mL/min (ref >60)
GFR calc non Af Amer: >60 mL/min (ref >60)
Glucose, Bld: 87 mg/dL (ref 70–99)
Potassium: 4 mmol/L (ref 3.5–5.1)
Sodium: 142 mmol/L (ref 135–145)
Total Bilirubin: 0.3 mg/dL (ref 0.3–1.2)
Total Protein: 8 g/dL (ref 6.5–8.1)

## 2018-09-06 LAB — URINALYSIS, ROUTINE W REFLEX MICROSCOPIC
Bilirubin Urine: NEGATIVE
Glucose, UA: NEGATIVE mg/dL
Ketones, ur: NEGATIVE mg/dL
Leukocytes,Ua: NEGATIVE
Nitrite: NEGATIVE
Protein, ur: 100 mg/dL — AB
RBC / HPF: >50 RBC/hpf — ABNORMAL HIGH (ref 0–5)
Specific Gravity, Urine: 1.016 (ref 1.005–1.030)
pH: 5 (ref 5.0–8.0)

## 2018-09-06 LAB — URINALYSIS, MICROSCOPIC (REFLEX): RBC / HPF: >50 RBC/hpf (ref 0–5)

## 2018-09-06 LAB — CBC WITH DIFFERENTIAL/PLATELET
Abs Immature Granulocytes: 0.02 10*3/uL (ref 0.00–0.07)
Basophils Absolute: 0.1 10*3/uL (ref 0.0–0.1)
Basophils Relative: 1 %
Eosinophils Absolute: 0.1 10*3/uL (ref 0.0–0.5)
Eosinophils Relative: 1 %
HCT: 42 % (ref 39.0–52.0)
Hemoglobin: 13.3 g/dL (ref 13.0–17.0)
Immature Granulocytes: 0 %
Lymphocytes Relative: 36 %
Lymphs Abs: 2 10*3/uL (ref 0.7–4.0)
MCH: 28 pg (ref 26.0–34.0)
MCHC: 31.7 g/dL (ref 30.0–36.0)
MCV: 88.4 fL (ref 80.0–100.0)
Monocytes Absolute: 0.5 10*3/uL (ref 0.1–1.0)
Monocytes Relative: 10 %
Neutro Abs: 2.8 10*3/uL (ref 1.7–7.7)
Neutrophils Relative %: 52 %
Platelets: 222 10*3/uL (ref 150–400)
RBC: 4.75 MIL/uL (ref 4.22–5.81)
RDW: 14.3 % (ref 11.5–15.5)
WBC: 5.5 10*3/uL (ref 4.0–10.5)
nRBC: 0 % (ref 0.0–0.2)

## 2018-09-06 MED ORDER — SODIUM CHLORIDE 0.9 % IV BOLUS
1000.0000 mL | Freq: Once | INTRAVENOUS | Status: AC
  Administered 2018-09-06: 1000 mL via INTRAVENOUS

## 2018-09-06 MED ORDER — SODIUM CHLORIDE 0.9 % IV BOLUS
1000.0000 mL | Freq: Once | INTRAVENOUS | Status: AC
  Administered 2018-09-06: 14:00:00 1000 mL via INTRAVENOUS

## 2018-09-06 NOTE — Discharge Instructions (Signed)
Stay hydrated.   You will likely have blood in your urine for days.   You need to see urology this week   Return to ER if you are unable to urinate, large amount of clots, severe bladder spasms, fever, vomiting

## 2018-09-06 NOTE — ED Provider Notes (Signed)
[facility] DEPT Provider Note   CSN: [id number] Arrival date & time: 09/06/18  1250    History   Chief Complaint Chief Complaint  Patient presents with  . Hematuria    HPI Gregg Flores is a 73 y.o. male hx of DM, HTN, afib on eliquis, previous BPH s/p surgery, here presenting with hematuria.  He states that this morning he noticed some blood in his urine.  States that when he drinks some water it improves.  He denies trouble urinating.  Denies any fevers or chills.  He states that he is taking Eliquis since he has history of atrial fibrillation.     The history is provided by the patient.    Past Medical History:  Diagnosis Date  . BPH (benign prostatic hyperplasia)   . Cataracts, bilateral   . Chronic LBP   . Colon polyps   . Diverticulosis   . ED (erectile dysfunction)   . Essential hypertension, benign 1990  . Microalbuminuria   . Monoclonal gammopathy 04/2008  . Type II or unspecified type diabetes mellitus without mention of complication, not stated as uncontrolled     Patient Active Problem List   Diagnosis Date Noted  . 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 BMI 35.0-35.9,adult 05/18/2014  . History of hepatitis C 06/15/2012  . Hyperlipidemia LDL goal <70 04/06/2012  . Tobacco use 09/25/2011  . Essential hypertension, benign   . Chronic low back pain   . Type 2 diabetes mellitus with renal manifestations ([place])   . Diverticulosis   . Benign prostatic hyperplasia with urinary obstruction   . Monoclonal gammopathy   . Cataracts, bilateral   . Erectile dysfunction due to arterial insufficiency   . Microalbuminuria   . Benign neoplasm of colon 07/01/2008    Past Surgical History:  Procedure Laterality Date  . CARDIOVERSION N/A 12/08/2015   Procedure: CARDIOVERSION;  Surgeon: Larey Dresser, MD;  Location: [facility] ENDOSCOPY;  Service: Cardiovascular;  Laterality: N/A;  . TEE WITHOUT CARDIOVERSION N/A 12/08/2015   Procedure: TRANSESOPHAGEAL ECHOCARDIOGRAM (TEE);  Surgeon: Larey Dresser, MD;  Location: [facility];  Service: Cardiovascular;  Laterality: N/A;  . TRANSURETHRAL RESECTION OF PROSTATE          Home Medications    Prior to Admission medications   Medication Sig Start Date End Date Taking? Authorizing Provider  amLODipine (NORVASC) 10 MG tablet take 1 tablet by mouth once daily 12/10/16  Yes Jeffery, Chelle, PA  apixaban (ELIQUIS) 5 MG TABS tablet Take 1 tablet (5 mg total) by mouth 2 (two) times daily. 02/07/18  Yes Josue Hector, MD  atorvastatin (LIPITOR) 40 MG tablet Take 40 mg by mouth daily.   Yes [provider]  benazepril (LOTENSIN) 40 MG tablet take 1 tablet by mouth once daily 12/10/16  Yes Jeffery, Chelle, PA  fluticasone (FLONASE) 50 MCG/ACT nasal spray Place 2 sprays into both nostrils daily.  02/04/18  Yes [provider]  hydrochlorothiazide (HYDRODIURIL) 25 MG tablet Take 1 tablet (25 mg total) by mouth every morning. 02/05/17  Yes Harrison Mons, PA  metFORMIN (GLUCOPHAGE) 1000 MG tablet take 1 tablet by mouth twice a day with food 12/10/16  Yes Jeffery, Chelle, PA  metoprolol tartrate (LOPRESSOR) 50 MG tablet take 1 tablet by mouth twice a day 12/10/16  Yes Jeffery, Chelle, PA  sitaGLIPtin (JANUVIA) 50 MG tablet Take 50 mg by mouth daily.   Yes [provider]  diclofenac sodium (VOLTAREN) 1 % GEL Apply  2 g topically daily as needed. Do not use more than 2 times each week. Patient not taking: Reported on 09/06/2018 01/03/16   Harrison Mons, PA  HYDROcodone-acetaminophen (NORCO) 7.5-325 MG tablet Take 1 tablet by mouth 2 (two) times daily as needed. For pain 01/03/16   Harrison Mons, PA  ipratropium (ATROVENT) 0.03 % nasal spray Place 2 sprays into the nose every 12 (twelve) hours. Patient not taking: Reported on 09/06/2018 05/18/14   Harrison Mons, PA    Family History Family History  Problem Relation Age of Onset  . Heart disease Mother         s/p CABG  . Hypertension Mother   . Diabetes Brother   . Colon cancer Neg Hx     Social History Social History   Tobacco Use  . Smoking status: Current Every Day Smoker    Packs/day: 1.00    Years: 43.00    Pack years: 43.00    Types: Cigarettes  . Smokeless tobacco: Never Used  . Tobacco comment: has cut back to 0.5 ppd (09/27/15) using patches  Substance Use Topics  . Alcohol use: No    Alcohol/week: 0.0 - 1.0 standard drinks    Comment: occasionally, wine and liquour   . Drug use: No     Allergies   Shrimp [shellfish allergy]   Review of Systems Review of Systems  Genitourinary: Positive for hematuria.  All other systems reviewed and are negative.    Physical Exam Updated Vital Signs BP (!) 148/89   Pulse (!) 31   Temp 98.4 F (36.9 C) (Oral)   Resp 15   Ht 5\' 9"  (1.753 m)   Wt 107.2 kg   SpO2 99%   BMI 34.91 kg/m   Physical Exam Vitals signs and nursing note reviewed.  HENT:     Head: Normocephalic.     Nose: Nose normal.     Mouth/Throat:     Mouth: Mucous membranes are moist.  Eyes:     Extraocular Movements: Extraocular movements intact.     Pupils: Pupils are equal, round, and reactive to light.  Neck:     Musculoskeletal: Normal range of motion and neck supple.  Cardiovascular:     Rate and Rhythm: Normal rate and regular rhythm.     Pulses: Normal pulses.     Heart sounds: Normal heart sounds.  Pulmonary:     Effort: Pulmonary effort is normal.     Breath sounds: Normal breath sounds.  Abdominal:     General: Abdomen is flat.     Palpations: Abdomen is soft.     Comments: Mild suprapubic tenderness, no CVAT   Musculoskeletal: Normal range of motion.  Skin:    General: Skin is warm.     Capillary Refill: Capillary refill takes less than 2 seconds.  Neurological:     General: No focal deficit present.     Mental Status: He is alert and oriented to person, place, and time.  Psychiatric:        Mood and Affect: Mood normal.         Behavior: Behavior normal.      ED Treatments / Results  Labs (all labs ordered are listed, but only abnormal results are displayed) Labs Reviewed  URINALYSIS, ROUTINE W REFLEX MICROSCOPIC - Abnormal; Notable for the following components:      Result Value   Color, Urine RED (*)    APPearance TURBID (*)    Glucose, UA   (*)  Value: TEST NOT REPORTED DUE TO COLOR INTERFERENCE OF URINE PIGMENT   Hgb urine dipstick   (*)    Value: TEST NOT REPORTED DUE TO COLOR INTERFERENCE OF URINE PIGMENT   Bilirubin Urine   (*)    Value: TEST NOT REPORTED DUE TO COLOR INTERFERENCE OF URINE PIGMENT   Ketones, ur   (*)    Value: TEST NOT REPORTED DUE TO COLOR INTERFERENCE OF URINE PIGMENT   Protein, ur   (*)    Value: TEST NOT REPORTED DUE TO COLOR INTERFERENCE OF URINE PIGMENT   Nitrite   (*)    Value: TEST NOT REPORTED DUE TO COLOR INTERFERENCE OF URINE PIGMENT   Leukocytes,Ua   (*)    Value: TEST NOT REPORTED DUE TO COLOR INTERFERENCE OF URINE PIGMENT   All other components within normal limits  COMPREHENSIVE METABOLIC PANEL - Abnormal; Notable for the following components:   CO2 21 (*)    All other components within normal limits  URINALYSIS, MICROSCOPIC (REFLEX) - Abnormal; Notable for the following components:   Bacteria, UA FEW (*)    All other components within normal limits  URINALYSIS, ROUTINE W REFLEX MICROSCOPIC - Abnormal; Notable for the following components:   Color, Urine AMBER (*)    APPearance CLOUDY (*)    Hgb urine dipstick LARGE (*)    Protein, ur 100 (*)    RBC / HPF >50 (*)    Bacteria, UA RARE (*)    All other components within normal limits  URINE CULTURE  CBC WITH DIFFERENTIAL/PLATELET    EKG None  Radiology No results found.  Procedures Procedures (including critical care time)  Medications Ordered in ED Medications  sodium chloride 0.9 % bolus 1,000 mL (1,000 mLs Intravenous New Bag/Given 09/06/18 1424)  sodium chloride 0.9 % bolus 1,000 mL (0 mLs  Intravenous Stopped 09/06/18 1622)     Initial Impression / Assessment and Plan / ED Course  I have reviewed the triage vital signs and the nursing notes.  Pertinent labs & imaging results that were available during my care of the patient were reviewed by me and considered in my medical decision making (see chart for details).        Gregg Flores is a 73 y.o. male here with hematuria. He is on eliquis for afib. Bedside US showed about only 100 cc of urine in the bladder with possible clot. Since he is not in retention, will get UA and chemistry. Will hydrate aggressively and hold off on foley for now.   4:45 PM Labs unremarkable. Initial urine unable to be analyzed due to large amount of blood.  After a liter bolus, he was able to urinate and is much clear.  His repeat urinalysis showed still large amount of blood with no leukocytes. Urine culture is sent .  I ordered a second normal saline bolus and I think at this point he is stable for discharge and will hold off on Foley catheter currently as he is not in retention.  I told him to follow-up with his urologist this week and to stay hydrated and return if he is unable to urinate.    Final Clinical Impressions(s) / ED Diagnoses   Final diagnoses:  None    ED Discharge Orders    None       Drenda Freeze, MD 09/06/18 [phone]

## 2018-09-06 NOTE — ED Triage Notes (Signed)
Pt presents with c/o hematuria. Pt reports he noticed the blood this morning while using the bathroom. Pt does have a hx of a bladder operation in 2006.

## 2018-09-07 LAB — URINE CULTURE

## 2018-09-30 ENCOUNTER — Other Ambulatory Visit: Payer: Self-pay

## 2018-09-30 ENCOUNTER — Ambulatory Visit: Payer: Medicare Other | Admitting: Podiatry

## 2018-09-30 ENCOUNTER — Encounter: Payer: Self-pay | Admitting: Podiatry

## 2018-09-30 DIAGNOSIS — B351 Tinea unguium: Secondary | ICD-10-CM | POA: Diagnosis not present

## 2018-09-30 DIAGNOSIS — Q828 Other specified congenital malformations of skin: Secondary | ICD-10-CM

## 2018-09-30 DIAGNOSIS — M79676 Pain in unspecified toe(s): Secondary | ICD-10-CM | POA: Diagnosis not present

## 2018-09-30 DIAGNOSIS — E119 Type 2 diabetes mellitus without complications: Secondary | ICD-10-CM | POA: Diagnosis not present

## 2018-09-30 DIAGNOSIS — M79609 Pain in unspecified limb: Secondary | ICD-10-CM

## 2018-09-30 DIAGNOSIS — L600 Ingrowing nail: Secondary | ICD-10-CM

## 2018-09-30 NOTE — Patient Instructions (Addendum)
APPLY ANTIBIOTIC OINTMENT TO RIGHT GREAT TOE ONCE A DAY FOR ONE WEEK  Diabetes Mellitus and Foot Care Foot care is an important part of your health, especially when you have diabetes. Diabetes may cause you to have problems because of poor blood flow (circulation) to your feet and legs, which can cause your skin to:  Become thinner and drier.  Break more easily.  Heal more slowly.  Peel and crack. You may also have nerve damage (neuropathy) in your legs and feet, causing decreased feeling in them. This means that you may not notice minor injuries to your feet that could lead to more serious problems. Noticing and addressing any potential problems early is the best way to prevent future foot problems. How to care for your feet Foot hygiene  Wash your feet daily with warm water and mild soap. Do not use hot water. Then, pat your feet and the areas between your toes until they are completely dry. Do not soak your feet as this can dry your skin.  Trim your toenails straight across. Do not dig under them or around the cuticle. File the edges of your nails with an emery board or nail file.  Apply a moisturizing lotion or petroleum jelly to the skin on your feet and to dry, brittle toenails. Use lotion that does not contain alcohol and is unscented. Do not apply lotion between your toes. Shoes and socks  Wear clean socks or stockings every day. Make sure they are not too tight. Do not wear knee-high stockings since they may decrease blood flow to your legs.  Wear shoes that fit properly and have enough cushioning. Always look in your shoes before you put them on to be sure there are no objects inside.  To break in new shoes, wear them for just a few hours a day. This prevents injuries on your feet. Wounds, scrapes, corns, and calluses  Check your feet daily for blisters, cuts, bruises, sores, and redness. If you cannot see the bottom of your feet, use a mirror or ask someone for help.  Do  not cut corns or calluses or try to remove them with medicine.  If you find a minor scrape, cut, or break in the skin on your feet, keep it and the skin around it clean and dry. You may clean these areas with mild soap and water. Do not clean the area with peroxide, alcohol, or iodine.  If you have a wound, scrape, corn, or callus on your foot, look at it several times a day to make sure it is healing and not infected. Check for: ? Redness, swelling, or pain. ? Fluid or blood. ? Warmth. ? Pus or a bad smell. General instructions  Do not cross your legs. This may decrease blood flow to your feet.  Do not use heating pads or hot water bottles on your feet. They may burn your skin. If you have lost feeling in your feet or legs, you may not know this is happening until it is too late.  Protect your feet from hot and cold by wearing shoes, such as at the beach or on hot pavement.  Schedule a complete foot exam at least once a year (annually) or more often if you have foot problems. If you have foot problems, report any cuts, sores, or bruises to your health care provider immediately. Contact a health care provider if:  You have a medical condition that increases your risk of infection and you have any cuts,  sores, or bruises on your feet.  You have an injury that is not healing.  You have redness on your legs or feet.  You feel burning or tingling in your legs or feet.  You have pain or cramps in your legs and feet.  Your legs or feet are numb.  Your feet always feel cold.  You have pain around a toenail. Get help right away if:  You have a wound, scrape, corn, or callus on your foot and: ? You have pain, swelling, or redness that gets worse. ? You have fluid or blood coming from the wound, scrape, corn, or callus. ? Your wound, scrape, corn, or callus feels warm to the touch. ? You have pus or a bad smell coming from the wound, scrape, corn, or callus. ? You have a fever. ? You  have a red line going up your leg. Summary  Check your feet every day for cuts, sores, red spots, swelling, and blisters.  Moisturize feet and legs daily.  Wear shoes that fit properly and have enough cushioning.  If you have foot problems, report any cuts, sores, or bruises to your health care provider immediately.  Schedule a complete foot exam at least once a year (annually) or more often if you have foot problems. This information is not intended to replace advice given to you by your health care provider. Make sure you discuss any questions you have with your health care provider. Document Released: 01/13/2000 Document Revised: 02/27/2017 Document Reviewed: 02/17/2016 Elsevier Patient Education  [place]? An infection that lies within the keratin of your nail plate that is caused by a fungus.  WHY ME? Fungal infections affect all ages, sexes, races, and creeds.  There may be many factors that predispose you to a fungal infection such as age, coexisting medical conditions such as diabetes, or an autoimmune disease; stress, medications, fatigue, genetics, etc.  Bottom line: fungus thrives in a warm, moist environment and your shoes offer such a location.  IS IT CONTAGIOUS? Theoretically, yes.  You do not want to share shoes, nail clippers or files with someone who has fungal toenails.  Walking around barefoot in the same room or sleeping in the same bed is unlikely to transfer the organism.  It is important to realize, however, that fungus can spread easily from one nail to the next on the same foot.  HOW DO WE TREAT THIS?  There are several ways to treat this condition.  Treatment may depend on many factors such as age, medications, pregnancy, liver and kidney conditions, etc.  It is best to ask your doctor which options are available to you.  1. No treatment.   Unlike many other medical concerns, you can live with this condition.   However for many people this can be a painful condition and may lead to ingrown toenails or a bacterial infection.  It is recommended that you keep the nails cut short to help reduce the amount of fungal nail. 2. Topical treatment.  These range from herbal remedies to prescription strength nail lacquers.  About 40-50% effective, topicals require twice daily application for approximately 9 to 12 months or until an entirely new nail has grown out.  The most effective topicals are medical grade medications available through physicians offices. 3. Oral antifungal medications.  With an 80-90% cure rate, the most common oral medication requires 3 to 4 months of therapy and stays in your system for  a year as the new nail grows out.  Oral antifungal medications do require blood work to make sure it is a safe drug for you.  A liver function panel will be performed prior to starting the medication and after the first month of treatment.  It is important to have the blood work performed to avoid any harmful side effects.  In general, this medication safe but blood work is required. 4. Laser Therapy.  This treatment is performed by applying a specialized laser to the affected nail plate.  This therapy is noninvasive, fast, and non-painful.  It is not covered by insurance and is therefore, out of pocket.  The results have been very good with a 80-95% cure rate.  [facility] is the only practice in the area to offer this therapy. 5. Permanent Nail Avulsion.  Removing the entire nail so that a new nail will not grow back.

## 2018-10-07 NOTE — Progress Notes (Signed)
Subjective: Gregg Flores is seen today for follow up painful  Porokeratoses and elongated, thickened toenails 1-5 b/l feet that he cannot cut. Pain interferes with daily activities. Aggravating factor includes wearing enclosed shoe gear and relieved with periodic debridement.  Gregg Flores is diabetic. He is on Eliquis. Also on antibiotics for bladder infection.  He denies any fever, chills, nightsweats, nausea or vomiting.  Objective:  Vascular Examination: Capillary refill time <3 seconds x 10 digits.  Dorsalis pedis present b/l.  Posterior tibial pulses present b/l.  Digital hair sparse x 10 digits.  No edema noted b/l.  Skin temperature gradient WNL b/l.  Dermatological Examination: Skin with normal turgor, texture and tone b/l.  No open wounds b/l.  Toenails 1-5 b/l discolored, thick, dystrophic with subungual debris and pain with palpation to nailbeds due to thickness of nails. Incurvated nailplate right great toe medial border with tenderness to palpation. No erythema, no edema, no drainage noted.  Porokeratotic lesions b/l heels with tenderness to palpation. No erythema, no edema, no drainage, no flocculence.  Musculoskeletal: Muscle strength 5/5 b/l to all LE muscle groups.  No gross bony deformities b/l.  No pain, crepitus or joint limitation noted with ROM.   Assessment: 1. Painful onychomycosis toenails 1-5 b/l  2. Ingrown toenail right great toe 3. Porokeratoses b/l heels 4. NIDDM  Plan: 1. Toenails 1-5 b/l were debrided in length and girth without iatrogenic bleeding. Offending nail border debrided and curretaged right hallux. Border cleansed with alcohol and tripe antibiotic applied. Patient instructed to apply antibiotic ointment to right great toe once daily for one week. Call if he experiences any problems. Avoid self trimming due to use of blood thinner. 2. Porokeratosis b/l heels pared and enucleated with sterile scalpel blade without  incident. 3. Patient to continue soft, supportive shoe gear daily. 4. Patient to report any pedal injuries to medical professional immediately. 5. Follow up 3 months.  6. Patient/POA to call should there be a concern in the interim.

## 2018-12-03 ENCOUNTER — Telehealth: Payer: Self-pay | Admitting: Cardiovascular Disease

## 2018-12-03 NOTE — Telephone Encounter (Signed)
° °  [facility] Pre-operative Risk Assessment    Request for surgical clearance:  1. What type of surgery is being performed? Trnansurethal Resecetion of the Prostate  2.   3. When is this surgery scheduled? Pending   4. What type of clearance is required (medical clearance vs. Pharmacy clearance to hold med vs. Both)?  Both  5. Are there any medications that need to be held prior to surgery and how long? Can Eliquis be held for 2 to 3 days prior to procedure   6. Practice name and name of physician performing surgery? Dr Minette Brine   7. What is your office phone number  [phone]   7.   What is your office fax number [phone]  8.   Anesthesia type (None, local, MAC, general) ?  General   Glyn Ade 12/03/2018, 10:01 AM  _________________________________________________________________   (provider comments below)

## 2018-12-03 NOTE — Telephone Encounter (Signed)
Patient with diagnosis of atrial fibrillation on Eliquis for anticoagulation. Patient has had recent ED visit on 09/06/18 for hematuria and was asked to follow up with urologist. No follow up within [facility] or Care Everywhere noted.  Procedure: transurethral resection of the prostate Date of procedure: TBD  CHADS2-VASc score of 3 (HTN, AGE, DM2)  CrCl ~100 mL/min Hgb 13.3, platelets 222  Per office protocol, patient can hold Eliquis for 2-3 days as needed prior to procedure.

## 2018-12-03 NOTE — Telephone Encounter (Signed)
   Primary Cardiologist: Jenkins Rouge, MD  Chart reviewed as part of pre-operative protocol coverage. Given past medical history and time since last visit, based on ACC/AHA guidelines, Gregg Flores would be at acceptable risk for the planned procedure without further cardiovascular testing.   Patient with diagnosis of atrial fibrillation on Eliquis for anticoagulation. Patient has had recent ED visit on 09/06/18 for hematuria and was asked to follow up with urologist. No follow up within [facility] or Care Everywhere noted.  Procedure: transurethral resection of the prostate Date of procedure: TBD  CHADS2-VASc score of 3 (HTN, AGE, DM2)  CrCl ~100 mL/min Hgb 13.3, platelets 222  Per office protocol, patient can hold Eliquis for 2-3 days as needed prior to procedure.    I will route this recommendation to the requesting party via Epic fax function and remove from pre-op pool.  Please call with questions.  Kathyrn Drown, NP 12/03/2018, 1:26 PM

## 2019-01-05 ENCOUNTER — Ambulatory Visit: Payer: Medicare Other | Admitting: Podiatry

## 2019-01-05 ENCOUNTER — Encounter: Payer: Self-pay | Admitting: Podiatry

## 2019-01-05 ENCOUNTER — Other Ambulatory Visit: Payer: Self-pay

## 2019-01-05 DIAGNOSIS — M79676 Pain in unspecified toe(s): Secondary | ICD-10-CM

## 2019-01-05 DIAGNOSIS — N183 Chronic kidney disease, stage 3 unspecified: Secondary | ICD-10-CM | POA: Diagnosis not present

## 2019-01-05 DIAGNOSIS — M79609 Pain in unspecified limb: Secondary | ICD-10-CM

## 2019-01-05 DIAGNOSIS — B351 Tinea unguium: Secondary | ICD-10-CM

## 2019-01-05 DIAGNOSIS — E1122 Type 2 diabetes mellitus with diabetic chronic kidney disease: Secondary | ICD-10-CM | POA: Diagnosis not present

## 2019-01-05 DIAGNOSIS — Q828 Other specified congenital malformations of skin: Secondary | ICD-10-CM | POA: Diagnosis not present

## 2019-01-10 NOTE — Progress Notes (Signed)
Subjective: Gregg Flores is a 73 y.o. y.o. male who presents today for preventative diabetic foot care. He presents with painful, elongated mycotic toenails and painful callus right heel. Pain is aggravated when wearing enclosed shoe gear and relieved with periodic professional debridement.  He states his PCP is treating him for left calf pain. He also has a pair of orthotics he got from his chiropractor. He states he will start wearing them again and will bring them on next visit for evaluation.   Medications reviewed in chart.  Allergies  Allergen Reactions  . Shrimp [Shellfish Allergy] Other (See Comments)    Stomach issues   Objective: There were no vitals filed for this visit.  Vascular Examination: Capillary refill time to digits <3 seconds b/l.   Dorsalis pedis present b/l.  Posterior tibial pulses present b/l.  Digital hair sparse b/l.  Skin temperature gradient WNL b/l.  Dermatological Examination: Skin with normal turgor, texture and tone b/l.  Toenails 1-5 b/l discolored, thick, dystrophic with subungual debris and pain with palpation to nailbeds due to thickness of nails.  Porokeratotic lesion right heel with tenderness to palpation. No erythema, no edema, no drainage, no flocculence.   Musculoskeletal: Muscle strength 5/5 to all LE muscle groups b/l.  HAV with bunion b/l.   No pain on calf compression b/l.   Neurological: Sensation intact 5/5 b/l with 10 gram monofilament.  Vibratory sensation intact b/l.  Assessment: 1. Painful onychomycosis toenails 1-5 b/l 2.  Porokeratosis right heel 3.  NIDDM with renal manifestations  Plan: 1. Continue diabetic foot care principles. Literature dispensed on today. 2. Toenails 1-5 b/l were debrided in length and girth without iatrogenic bleeding. 3. Porokeratosis right heel pared and enucleated with sterile scalpel blade without incident. Patient to continue soft, supportive shoe gear daily. 4. Patient to  report any pedal injuries to medical professional immediately. 5. Follow up 3 months.  6. Patient/POA to call should there be a concern in the interim.

## 2019-02-06 NOTE — Progress Notes (Signed)
CARDIOLOGY OFFICE NOTE  Date:  02/11/2019    Gregg Flores Date of Birth: Jun 12, 1945 Medical Record [id number]  PCP:  [name], MD  Cardiologist:  Gillian Shields    Chief Complaint  Patient presents with  . Follow-up    Seen for Dr. Johnsie Cancel    History of Present Illness: Gregg Flores is a 74 y.o. male who presents today for a 6 month check.  Seen for Dr. Johnsie Cancel.   He has a history of HTN, PAF, HLD and a dilated aortic root. He has had prior TEE/DCC back in 2017. CHADSVASC of at least 3. Stomach upset with Xarelto and turned down for patient assistance with Eliquis but remains on Eliquis. Last measurement of aortic root from 2020.   Last seen in January 2020 by Dr. Johnsie Cancel. Felt to be stable. I saw him last in July of 2020 - still smoking, medicines unclear but overall seemed ok.   The patient does not have symptoms concerning for COVID-19 infection (fever, chills, cough, or new shortness of breath).   Comes in today. Here alone. He says he is doing ok. May be smoking less but "hard when you just sit in the house and have nothing else to do". No chest pain. Breathing is ok. He has not been sick. He has been to urology for hematuria - this is resolved now. No palpitations. He says he is taking his medicines. He needs labs today. Has order in system for repeat CTA - has not been scheduled. Overall, he has no real concerns.   Past Medical History:  Diagnosis Date  . BPH (benign prostatic hyperplasia)   . Cataracts, bilateral   . Chronic LBP   . Colon polyps   . Diverticulosis   . ED (erectile dysfunction)   . Essential hypertension, benign 1990  . Microalbuminuria   . Monoclonal gammopathy 04/2008  . Type II or unspecified type diabetes mellitus without mention of complication, not stated as uncontrolled     Past Surgical History:  Procedure Laterality Date  . CARDIOVERSION N/A 12/08/2015   Procedure: CARDIOVERSION;  Surgeon: Larey Dresser, MD;   Location: [facility] ENDOSCOPY;  Service: Cardiovascular;  Laterality: N/A;  . TEE WITHOUT CARDIOVERSION N/A 12/08/2015   Procedure: TRANSESOPHAGEAL ECHOCARDIOGRAM (TEE);  Surgeon: Larey Dresser, MD;  Location: [place];  Service: Cardiovascular;  Laterality: N/A;  . TRANSURETHRAL RESECTION OF PROSTATE       Medications: Current Meds  Medication Sig  . amLODipine (NORVASC) 10 MG tablet take 1 tablet by mouth once daily  . apixaban (ELIQUIS) 5 MG TABS tablet Take 1 tablet (5 mg total) by mouth 2 (two) times daily.  Marland Kitchen atorvastatin (LIPITOR) 40 MG tablet Take 40 mg by mouth daily.  . benazepril (LOTENSIN) 40 MG tablet take 1 tablet by mouth once daily  . fluticasone (FLONASE) 50 MCG/ACT nasal spray Place 2 sprays into both nostrils daily.   . hydrochlorothiazide (HYDRODIURIL) 25 MG tablet Take 1 tablet (25 mg total) by mouth every morning.  Marland Kitchen HYDROcodone-acetaminophen (NORCO) 7.5-325 MG tablet Take 1 tablet by mouth 2 (two) times daily as needed. For pain  . ipratropium (ATROVENT) 0.03 % nasal spray Place 2 sprays into the nose every 12 (twelve) hours.  . metFORMIN (GLUCOPHAGE) 1000 MG tablet take 1 tablet by mouth twice a day with food  . metoprolol tartrate (LOPRESSOR) 50 MG tablet take 1 tablet by mouth twice a day  . sitaGLIPtin (JANUVIA) 50 MG tablet Take  50 mg by mouth daily.     Allergies: Allergies  Allergen Reactions  . Shrimp [Shellfish Allergy] Other (See Comments)    Stomach issues    Social History: The patient  reports that he has been smoking cigarettes. He has a 43.00 pack-year smoking history. He has never used smokeless tobacco. He reports that he does not drink alcohol or use drugs.   Family History: The patient's family history includes Diabetes in his brother; Heart disease in his mother; Hypertension in his mother.   Review of Systems: Please see the history of present illness.   All other systems are reviewed and negative.   Physical Exam: VS:  BP 128/82    Pulse (!) 58   Ht 5\' 11"  (1.803 m)   Wt 233 lb 12.8 oz (106.1 kg)   SpO2 99%   BMI 32.61 kg/m  .  BMI Body mass index is 32.61 kg/m.  Wt Readings from Last 3 Encounters:  02/11/19 233 lb 12.8 oz (106.1 kg)  09/06/18 236 lb 6.4 oz (107.2 kg)  08/25/18 236 lb 6.4 oz (107.2 kg)    General: Pleasant. Alert and in no acute distress. Remains obese but weight is down a few pounds.    HEENT: Normal.  Neck: Supple, no JVD, carotid bruits, or masses noted.  Cardiac: Regular rate and rhythm. Heart tones are distant. No edema.  Respiratory:  Lungs are clear to auscultation bilaterally with normal work of breathing.  GI: Soft and nontender.  MS: No deformity or atrophy. Gait and ROM intact.  Skin: Warm and dry. Color is normal.  Neuro:  Strength and sensation are intact and no gross focal deficits noted.  Psych: Alert, appropriate and with normal affect.   LABORATORY DATA:  EKG:  EKG is ordered today. This demonstrates sinus bradycardia with PAC. HR is 54.  Lab Results  Component Value Date   WBC 5.5 09/06/2018   HGB 13.3 09/06/2018   HCT 42.0 09/06/2018   PLT 222 09/06/2018   GLUCOSE 87 09/06/2018   CHOL 108 08/25/2018   TRIG 97 08/25/2018   HDL 27 (L) 08/25/2018   LDLCALC 62 08/25/2018   ALT 18 09/06/2018   AST 21 09/06/2018   NA 142 09/06/2018   K 4.0 09/06/2018   CL 108 09/06/2018   CREATININE 0.90 09/06/2018   BUN 17 09/06/2018   CO2 21 (L) 09/06/2018   TSH 1.450 08/25/2018   INR 1.1 11/24/2015   HGBA1C 7.0 (H) 02/05/2017   MICROALBUR 7.4 11/16/2014     BNP (last 3 results) No results for input(s): BNP in the last 8760 hours.  ProBNP (last 3 results) No results for input(s): PROBNP in the last 8760 hours.   Other Studies Reviewed Today:  CTA CHEST IMPRESSION 01/2018: Aneurysmal dilatation of the ascending aorta is not significantly changed at 4.2 cm. Recommend annual imaging followup by CTA or MRA. This recommendation follows  2010 ACCF/AHA/AATS/ACR/ASA/SCA/SCAI/SIR/STS/SVM Guidelines for the Diagnosis and Management of Patients with Thoracic Aortic Disease. Circulation. 2010; [id number]. Aortic aneurysm NOS (ICD10-I71.9)  Aortic Atherosclerosis (ICD10-I70.0) and Emphysema (ICD10-J43.9).   Electronically Signed By: Marybelle Killings M.D. On: 02/21/2018 09:33    Procedure: TEE 2017 Findings: Please see echo section for full report. The patient was in rapid atrial fibrillation. Normal LV size with moderate LV hypertrophy. EF 45%, diffuse hypokinesis. Normal RV size and systolic function. Mild left atrial enlargement, no LA appendage thrombus. Normal right atrium. Trileaflet aortic valve with no stenosis or regurgitation. Trivial MR. There appeared  to be a loose chord attached to the anterior mitral leaflet. However, there was only mild mitral regurgitation. No PFO/ASD, negative bubble study. 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    May proceed to DCCV. Would repeat echo in sinus rhythm to confirm EF.   Loralie Champagne 12/08/2015   ECHO 08/09/15 Study Conclusions  - Left ventricle: The cavity size was normal. Systolic function was normal. The estimated ejection fraction was in the range of 55% to 60%. Wall motion was normal; there were no regional wall motion abnormalities. The study was not technically sufficient to allow evaluation of LV diastolic dysfunction due to atrial fibrillation. - Aortic valve: Trileaflet; mildly thickened, mildly calcified leaflets. Moderate focal calcification involving the noncoronary cusp. - Aorta: Aortic root dimension: 39 mm (ED). Ascending aorta diameter: 4.39 mm (ED). - Aortic root: The aortic root was mildly dilated. - Ascending aorta: The ascending aorta was mildly dilated. - Mitral valve: MIldly calcified chordae tendinae - Right ventricle: The cavity size  was mildly dilated. Wall thickness was normal.   Procedure: Electrical Cardioversion 2017 Indications:Atrial Fibrillation  Procedure Details Consent: Risks of procedure as well as the alternatives and risks of each were explained to the (patient/caregiver). Consent for procedure obtained. Time [id number] patient identification, verified procedure, site/side was marked, verified correct patient position, special equipment/implants available, medications/allergies/relevent history reviewed, required imaging and test results available. Performed  Patient placed on cardiac monitor, pulse oximetry, supplemental oxygen as necessary.  Sedation given: Propofol per anesthesiology Pacer pads placed anterior and posterior chest.  Cardioverted 1time(s).  Cardioverted at [place].  Evaluation Findings: Post procedure EKG shows: NSR Complications: None Patient didtolerate procedure well.   Loralie Champagne 12/08/2015, 12:35 PM   Assessment & Plan:  1. PAF - prior cardioversion 11/2015 - remains in sinus by exam and EKG today - CHADSVASC of at least 4 - no changes made today.   2. HTN - BP is good - no changes made today.   3. HLD - on statin - checking lab today.   4. DM - per PCP  5. Dilated ascending aorta -last study was in 01/2018 - order released and will arrange follow up study. Lab today.   6. Chronic anticoagulation - no problems noted - prior hematuria that is resolved - surveillance lab today..   7. Tobacco abuse - not quite ready to stop yet.   8. COVID-19 Education: The signs and symptoms of COVID-19 were discussed with the patient and how to seek care for testing (follow up with PCP or arrange E-visit).  The importance of social distancing, staying at home, hand hygiene and wearing a mask when out in public were discussed today.  Current medicines are reviewed with the patient today.  The patient does not have concerns regarding medicines other than what  has been noted above.  The following changes have been made:  See above.  Labs/ tests ordered today include:    Orders Placed This Encounter  Procedures  . Basic metabolic panel  . CBC no Diff  . Hepatic function panel  . Lipid Profile  . EKG 12-Lead     Disposition:   FU with Dr. Johnsie Cancel in 6 months.   Patient is agreeable to this plan and will call if any problems develop in the interim.   SignedTruitt Merle, NP  02/11/2019 8:15 AM  [facility] [address] [place], [place]  [address]  Phone: [phone] Fax: [phone]

## 2019-02-11 ENCOUNTER — Ambulatory Visit: Payer: Medicare Other | Admitting: Nurse Practitioner

## 2019-02-11 ENCOUNTER — Other Ambulatory Visit: Payer: Self-pay

## 2019-02-11 ENCOUNTER — Encounter: Payer: Self-pay | Admitting: Nurse Practitioner

## 2019-02-11 VITALS — BP 128/82 | HR 58 | Ht 71.0 in | Wt 233.8 lb

## 2019-02-11 DIAGNOSIS — I480 Paroxysmal atrial fibrillation: Secondary | ICD-10-CM

## 2019-02-11 DIAGNOSIS — E782 Mixed hyperlipidemia: Secondary | ICD-10-CM

## 2019-02-11 DIAGNOSIS — I712 Thoracic aortic aneurysm, without rupture, unspecified: Secondary | ICD-10-CM

## 2019-02-11 DIAGNOSIS — I10 Essential (primary) hypertension: Secondary | ICD-10-CM | POA: Diagnosis not present

## 2019-02-11 DIAGNOSIS — Z79899 Other long term (current) drug therapy: Secondary | ICD-10-CM

## 2019-02-11 DIAGNOSIS — I4819 Other persistent atrial fibrillation: Secondary | ICD-10-CM | POA: Diagnosis not present

## 2019-02-11 DIAGNOSIS — Z7189 Other specified counseling: Secondary | ICD-10-CM

## 2019-02-11 DIAGNOSIS — Z7901 Long term (current) use of anticoagulants: Secondary | ICD-10-CM

## 2019-02-11 LAB — CBC
Hematocrit: 43.6 % (ref 37.5–51.0)
Hemoglobin: 13.8 g/dL (ref 13.0–17.7)
MCH: 26.6 pg (ref 26.6–33.0)
MCHC: 31.7 g/dL (ref 31.5–35.7)
MCV: 84 fL (ref 79–97)
Platelets: 269 10*3/uL (ref 150–450)
RBC: 5.18 x10E6/uL (ref 4.14–5.80)
RDW: 13.7 % (ref 11.6–15.4)
WBC: 5.9 10*3/uL (ref 3.4–10.8)

## 2019-02-11 LAB — BASIC METABOLIC PANEL
BUN/Creatinine Ratio: 15 (ref 10–24)
BUN: 15 mg/dL (ref 8–27)
CO2: 22 mmol/L (ref 20–29)
Calcium: 9.5 mg/dL (ref 8.6–10.2)
Chloride: 99 mmol/L (ref 96–106)
Creatinine, Ser: 1.02 mg/dL (ref 0.76–1.27)
GFR calc Af Amer: 83 mL/min/{1.73_m2} (ref >59)
GFR calc non Af Amer: 72 mL/min/{1.73_m2} (ref >59)
Glucose: 103 mg/dL — ABNORMAL HIGH (ref 65–99)
Potassium: 4.4 mmol/L (ref 3.5–5.2)
Sodium: 138 mmol/L (ref 134–144)

## 2019-02-11 LAB — LIPID PANEL
Chol/HDL Ratio: 3.6 ratio (ref 0.0–5.0)
Cholesterol, Total: 105 mg/dL (ref 100–199)
HDL: 29 mg/dL — ABNORMAL LOW (ref >39)
LDL Chol Calc (NIH): 53 mg/dL (ref 0–99)
Triglycerides: 126 mg/dL (ref 0–149)
VLDL Cholesterol Cal: 23 mg/dL (ref 5–40)

## 2019-02-11 LAB — HEPATIC FUNCTION PANEL
ALT: 13 IU/L (ref 0–44)
AST: 13 IU/L (ref 0–40)
Albumin: 4.5 g/dL (ref 3.7–4.7)
Alkaline Phosphatase: 103 IU/L (ref 39–117)
Bilirubin Total: 0.3 mg/dL (ref 0.0–1.2)
Bilirubin, Direct: 0.11 mg/dL (ref 0.00–0.40)
Total Protein: 7.4 g/dL (ref 6.0–8.5)

## 2019-02-11 NOTE — Patient Instructions (Addendum)
After Visit Summary:  We will be checking the following labs today - BMET, CBC, HPF and Lipids   Medication Instructions:    Continue with your current medicines.    If you need a refill on your cardiac medications before your next appointment, please call your pharmacy.     Testing/Procedures To Be Arranged:  CTA of the aorta (order was released)  Follow-Up:   See Dr. Johnsie Cancel in 6 months    At [facility], you and your health needs are our priority.  As part of our continuing mission to provide you with exceptional heart care, we have created designated Provider Care Teams.  These Care Teams include your primary Cardiologist (physician) and Advanced Practice Providers (APPs -  Physician Assistants and Nurse Practitioners) who all work together to provide you with the care you need, when you need it.  Special Instructions:  . Stay safe, stay home, wash your hands for at least 20 seconds and wear a mask when out in public.  . It was good to talk with you today.    We are recommending the COVID 19 vaccine to ALL of our patients. Cardiac medications (including blood thinners) should not deter anyone from being vaccinated. There is no need to hold any of these medicines prior to vaccine administration.   If you are interested in the COVID 19 vaccine - call the [facility] Department at [phone] and select Option #2. Appointments can be arranged at various locations. No walk-ins will be accepted.   Visit [email or web address] and clinic on the "COVID 19 Vaccine Info" for more information.   COVID-19 Vaccine Information can also be found at: [email or web address].co.[email or web address]   For questions related to vaccine distribution or appointments, please email [email or web address] or call [phone].     Call the [place] office at [phone] if you have any questions, problems or concerns.

## 2019-02-16 ENCOUNTER — Encounter (HOSPITAL_COMMUNITY): Payer: Self-pay

## 2019-02-16 ENCOUNTER — Other Ambulatory Visit: Payer: Self-pay

## 2019-02-16 ENCOUNTER — Ambulatory Visit (HOSPITAL_COMMUNITY)
Admission: RE | Admit: 2019-02-16 | Discharge: 2019-02-16 | Disposition: A | Discharge status: Home / Self Care | Payer: Medicare Other | Source: Ambulatory Visit | Attending: Cardiovascular Disease | Admitting: Cardiovascular Disease

## 2019-02-16 DIAGNOSIS — I712 Thoracic aortic aneurysm, without rupture, unspecified: Secondary | ICD-10-CM

## 2019-02-16 MED ORDER — IOHEXOL 350 MG/ML SOLN
100.0000 mL | Freq: Once | INTRAVENOUS | Status: AC | PRN
  Administered 2019-02-16: 100 mL via INTRAVENOUS

## 2019-04-06 ENCOUNTER — Other Ambulatory Visit: Payer: Self-pay

## 2019-04-06 ENCOUNTER — Ambulatory Visit: Payer: Medicare Other | Admitting: Podiatry

## 2019-04-06 ENCOUNTER — Encounter: Payer: Self-pay | Admitting: Podiatry

## 2019-04-06 DIAGNOSIS — Q828 Other specified congenital malformations of skin: Secondary | ICD-10-CM

## 2019-04-06 DIAGNOSIS — B351 Tinea unguium: Secondary | ICD-10-CM | POA: Diagnosis not present

## 2019-04-06 DIAGNOSIS — N183 Chronic kidney disease, stage 3 unspecified: Secondary | ICD-10-CM | POA: Diagnosis not present

## 2019-04-06 DIAGNOSIS — E1122 Type 2 diabetes mellitus with diabetic chronic kidney disease: Secondary | ICD-10-CM

## 2019-04-06 DIAGNOSIS — M79676 Pain in unspecified toe(s): Secondary | ICD-10-CM

## 2019-04-06 NOTE — Patient Instructions (Signed)
Diabetes Mellitus and Foot Care Foot care is an important part of your health, especially when you have diabetes. Diabetes may cause you to have problems because of poor blood flow (circulation) to your feet and legs, which can cause your skin to:  Become thinner and drier.  Break more easily.  Heal more slowly.  Peel and crack. You may also have nerve damage (neuropathy) in your legs and feet, causing decreased feeling in them. This means that you may not notice minor injuries to your feet that could lead to more serious problems. Noticing and addressing any potential problems early is the best way to prevent future foot problems. How to care for your feet Foot hygiene  Wash your feet daily with warm water and mild soap. Do not use hot water. Then, pat your feet and the areas between your toes until they are completely dry. Do not soak your feet as this can dry your skin.  Trim your toenails straight across. Do not dig under them or around the cuticle. File the edges of your nails with an emery board or nail file.  Apply a moisturizing lotion or petroleum jelly to the skin on your feet and to dry, brittle toenails. Use lotion that does not contain alcohol and is unscented. Do not apply lotion between your toes. Shoes and socks  Wear clean socks or stockings every day. Make sure they are not too tight. Do not wear knee-high stockings since they may decrease blood flow to your legs.  Wear shoes that fit properly and have enough cushioning. Always look in your shoes before you put them on to be sure there are no objects inside.  To break in new shoes, wear them for just a few hours a day. This prevents injuries on your feet. Wounds, scrapes, corns, and calluses  Check your feet daily for blisters, cuts, bruises, sores, and redness. If you cannot see the bottom of your feet, use a mirror or ask someone for help.  Do not cut corns or calluses or try to remove them with medicine.  If you  find a minor scrape, cut, or break in the skin on your feet, keep it and the skin around it clean and dry. You may clean these areas with mild soap and water. Do not clean the area with peroxide, alcohol, or iodine.  If you have a wound, scrape, corn, or callus on your foot, look at it several times a day to make sure it is healing and not infected. Check for: ? Redness, swelling, or pain. ? Fluid or blood. ? Warmth. ? Pus or a bad smell. General instructions  Do not cross your legs. This may decrease blood flow to your feet.  Do not use heating pads or hot water bottles on your feet. They may burn your skin. If you have lost feeling in your feet or legs, you may not know this is happening until it is too late.  Protect your feet from hot and cold by wearing shoes, such as at the beach or on hot pavement.  Schedule a complete foot exam at least once a year (annually) or more often if you have foot problems. If you have foot problems, report any cuts, sores, or bruises to your health care provider immediately. Contact a health care provider if:  You have a medical condition that increases your risk of infection and you have any cuts, sores, or bruises on your feet.  You have an injury that is not   healing.  You have redness on your legs or feet.  You feel burning or tingling in your legs or feet.  You have pain or cramps in your legs and feet.  Your legs or feet are numb.  Your feet always feel cold.  You have pain around a toenail. Get help right away if:  You have a wound, scrape, corn, or callus on your foot and: ? You have pain, swelling, or redness that gets worse. ? You have fluid or blood coming from the wound, scrape, corn, or callus. ? Your wound, scrape, corn, or callus feels warm to the touch. ? You have pus or a bad smell coming from the wound, scrape, corn, or callus. ? You have a fever. ? You have a red line going up your leg. Summary  Check your feet every day  for cuts, sores, red spots, swelling, and blisters.  Moisturize feet and legs daily.  Wear shoes that fit properly and have enough cushioning.  If you have foot problems, report any cuts, sores, or bruises to your health care provider immediately.  Schedule a complete foot exam at least once a year (annually) or more often if you have foot problems. This information is not intended to replace advice given to you by your health care provider. Make sure you discuss any questions you have with your health care provider. Document Revised: 10/08/2018 Document Reviewed: 02/17/2016 Elsevier Patient Education  2020 Elsevier Inc.  

## 2019-04-09 NOTE — Progress Notes (Signed)
Subjective: Gregg Flores presents today for follow up of preventative diabetic foot care and painful porokeratotic lesion(s) right heel and painful mycotic toenails b/l that limit ambulation. Aggravating factors include weightbearing with and without shoe gear. Pain for both is relieved with periodic professional debridement.   Gregg Flores voices no new pedal concerns on today's visit.   Allergies  Allergen Reactions  . Shrimp [Shellfish Allergy] Other (See Comments)    Stomach issues     Objective: There were no vitals filed for this visit.  Pt 74 y.o. year old male  in NAD. AAO x 3.   Vascular Examination:  Capillary fill time to digits <3 seconds b/l. Palpable DP pulses b/l. Palpable PT pulses b/l. Pedal hair sparse b/l. Skin temperature gradient within normal limits b/l.  Dermatological Examination: Pedal skin with normal turgor, texture and tone bilaterally. No open wounds bilaterally. No interdigital macerations bilaterally. Toenails 1-5 b/l elongated, dystrophic, thickened, crumbly with subungual debris and tenderness to dorsal palpation. Porokeratotic lesion(s) plantar right heel with tenderness to palpation. No erythema, no edema, no drainage, no flocculence.  Musculoskeletal: Normal muscle strength 5/5 to all lower extremity muscle groups bilaterally, no pain crepitus or joint limitation noted with ROM b/l and bunion deformity noted b/l  Neurological: Protective sensation intact 5/5 intact bilaterally with 10g monofilament b/l Vibratory sensation intact b/l  Assessment: 1. Pain due to onychomycosis of nail   2. Porokeratosis   3. Type 2 diabetes mellitus with stage 3 chronic kidney disease, without long-term current use of insulin, unspecified whether stage 3a or 3b CKD ([place])    Plan: -Continue diabetic foot care principles. Literature dispensed on today.  -Toenails 1-5 b/l were debrided in length and girth with sterile nail nippers and dremel without iatrogenic  bleeding.  -Painful porokeratotic lesions plantar aspect right heel pared and enucleated with sterile scalpel blade without incident. -Patient to continue soft, supportive shoe gear daily. -Patient to report any pedal injuries to medical professional immediately. -Patient/POA to call should there be question/concern in the interim.  Return in about 3 months (around 07/07/2019).

## 2019-07-06 ENCOUNTER — Ambulatory Visit: Payer: Medicare Other | Admitting: Podiatry

## 2019-07-08 ENCOUNTER — Other Ambulatory Visit: Payer: Self-pay

## 2019-07-08 ENCOUNTER — Ambulatory Visit: Payer: Medicare Other | Admitting: Podiatry

## 2019-07-08 ENCOUNTER — Encounter: Payer: Self-pay | Admitting: Podiatry

## 2019-07-08 DIAGNOSIS — M79676 Pain in unspecified toe(s): Secondary | ICD-10-CM | POA: Diagnosis not present

## 2019-07-08 DIAGNOSIS — L600 Ingrowing nail: Secondary | ICD-10-CM

## 2019-07-08 DIAGNOSIS — E1122 Type 2 diabetes mellitus with diabetic chronic kidney disease: Secondary | ICD-10-CM | POA: Diagnosis not present

## 2019-07-08 DIAGNOSIS — B351 Tinea unguium: Secondary | ICD-10-CM

## 2019-07-08 DIAGNOSIS — N183 Chronic kidney disease, stage 3 unspecified: Secondary | ICD-10-CM

## 2019-07-08 DIAGNOSIS — Q828 Other specified congenital malformations of skin: Secondary | ICD-10-CM

## 2019-07-08 DIAGNOSIS — M79609 Pain in unspecified limb: Secondary | ICD-10-CM

## 2019-07-08 NOTE — Patient Instructions (Signed)
Diabetes Mellitus and Foot Care Foot care is an important part of your health, especially when you have diabetes. Diabetes may cause you to have problems because of poor blood flow (circulation) to your feet and legs, which can cause your skin to:  Become thinner and drier.  Break more easily.  Heal more slowly.  Peel and crack. You may also have nerve damage (neuropathy) in your legs and feet, causing decreased feeling in them. This means that you may not notice minor injuries to your feet that could lead to more serious problems. Noticing and addressing any potential problems early is the best way to prevent future foot problems. How to care for your feet Foot hygiene  Wash your feet daily with warm water and mild soap. Do not use hot water. Then, pat your feet and the areas between your toes until they are completely dry. Do not soak your feet as this can dry your skin.  Trim your toenails straight across. Do not dig under them or around the cuticle. File the edges of your nails with an emery board or nail file.  Apply a moisturizing lotion or petroleum jelly to the skin on your feet and to dry, brittle toenails. Use lotion that does not contain alcohol and is unscented. Do not apply lotion between your toes. Shoes and socks  Wear clean socks or stockings every day. Make sure they are not too tight. Do not wear knee-high stockings since they may decrease blood flow to your legs.  Wear shoes that fit properly and have enough cushioning. Always look in your shoes before you put them on to be sure there are no objects inside.  To break in new shoes, wear them for just a few hours a day. This prevents injuries on your feet. Wounds, scrapes, corns, and calluses  Check your feet daily for blisters, cuts, bruises, sores, and redness. If you cannot see the bottom of your feet, use a mirror or ask someone for help.  Do not cut corns or calluses or try to remove them with medicine.  If you  find a minor scrape, cut, or break in the skin on your feet, keep it and the skin around it clean and dry. You may clean these areas with mild soap and water. Do not clean the area with peroxide, alcohol, or iodine.  If you have a wound, scrape, corn, or callus on your foot, look at it several times a day to make sure it is healing and not infected. Check for: ? Redness, swelling, or pain. ? Fluid or blood. ? Warmth. ? Pus or a bad smell. General instructions  Do not cross your legs. This may decrease blood flow to your feet.  Do not use heating pads or hot water bottles on your feet. They may burn your skin. If you have lost feeling in your feet or legs, you may not know this is happening until it is too late.  Protect your feet from hot and cold by wearing shoes, such as at the beach or on hot pavement.  Schedule a complete foot exam at least once a year (annually) or more often if you have foot problems. If you have foot problems, report any cuts, sores, or bruises to your health care provider immediately. Contact a health care provider if:  You have a medical condition that increases your risk of infection and you have any cuts, sores, or bruises on your feet.  You have an injury that is not   healing.  You have redness on your legs or feet.  You feel burning or tingling in your legs or feet.  You have pain or cramps in your legs and feet.  Your legs or feet are numb.  Your feet always feel cold.  You have pain around a toenail. Get help right away if:  You have a wound, scrape, corn, or callus on your foot and: ? You have pain, swelling, or redness that gets worse. ? You have fluid or blood coming from the wound, scrape, corn, or callus. ? Your wound, scrape, corn, or callus feels warm to the touch. ? You have pus or a bad smell coming from the wound, scrape, corn, or callus. ? You have a fever. ? You have a red line going up your leg. Summary  Check your feet every day  for cuts, sores, red spots, swelling, and blisters.  Moisturize feet and legs daily.  Wear shoes that fit properly and have enough cushioning.  If you have foot problems, report any cuts, sores, or bruises to your health care provider immediately.  Schedule a complete foot exam at least once a year (annually) or more often if you have foot problems. This information is not intended to replace advice given to you by your health care provider. Make sure you discuss any questions you have with your health care provider. Document Revised: 10/08/2018 Document Reviewed: 02/17/2016 Elsevier Patient Education  2020 Elsevier Inc.  

## 2019-07-08 NOTE — Progress Notes (Signed)
Subjective: Gregg Flores presents today at risk foot care. Pt has h/o NIDDM with chronic kidney disease and painful porokeratotic lesion(s) right heel and painful mycotic toenails b/l that limit ambulation. Aggravating factors include weightbearing with and without shoe gear. Pain for both is relieved with periodic professional debridement.  Gregg Rasmussen, MD is patient's PCP. Last visit BTD:VVOH month per [name]'s recollection.  Past Medical History:  Diagnosis Date  . BPH (benign prostatic hyperplasia)   . Cataracts, bilateral   . Chronic LBP   . Colon polyps   . Diverticulosis   . ED (erectile dysfunction)   . Essential hypertension, benign 1990  . Microalbuminuria   . Monoclonal gammopathy 04/2008  . Type II or unspecified type diabetes mellitus without mention of complication, not stated as uncontrolled      Current Outpatient Medications on File Prior to Visit  Medication Sig Dispense Refill  . amLODipine (NORVASC) 10 MG tablet take 1 tablet by mouth once daily 90 tablet 1  . apixaban (ELIQUIS) 5 MG TABS tablet Take 1 tablet (5 mg total) by mouth 2 (two) times daily. 180 tablet 3  . atorvastatin (LIPITOR) 40 MG tablet Take 40 mg by mouth daily.    . benazepril (LOTENSIN) 40 MG tablet take 1 tablet by mouth once daily 90 tablet 1  . cetirizine (ZYRTEC) 10 MG tablet Take 10 mg by mouth daily.    . fluticasone (FLONASE) 50 MCG/ACT nasal spray Place 2 sprays into both nostrils daily.     . hydrochlorothiazide (HYDRODIURIL) 25 MG tablet Take 1 tablet (25 mg total) by mouth every morning. 90 tablet 3  . HYDROcodone-acetaminophen (NORCO) 7.5-325 MG tablet Take 1 tablet by mouth 2 (two) times daily as needed. For pain 60 tablet 0  . ipratropium (ATROVENT) 0.03 % nasal spray Place 2 sprays into the nose every 12 (twelve) hours. 90 mL 3  . metFORMIN (GLUCOPHAGE) 1000 MG tablet take 1 tablet by mouth twice a day with food 180 tablet 1  . metoprolol tartrate (LOPRESSOR) 50 MG  tablet take 1 tablet by mouth twice a day 180 tablet 1  . sitaGLIPtin (JANUVIA) 50 MG tablet Take 50 mg by mouth daily.    . Vitamin D, Ergocalciferol, (DRISDOL) 1.25 MG (50000 UNIT) CAPS capsule Take 50,000 Units by mouth once a week.     No current facility-administered medications on file prior to visit.     Allergies  Allergen Reactions  . Shrimp [Shellfish Allergy] Other (See Comments)    Stomach issues    Objective: Gregg Flores is a pleasant 74 y.o. y.o. Patient Race: Black or African American [2]  male in NAD. AAO x 3.  There were no vitals filed for this visit.  Vascular Examination: Neurovascular status unchanged b/l lower extremities. Capillary fill time to digits <3 seconds b/l lower extremities. Palpable DP pulses b/l. Palpable PT pulses b/l. Pedal hair sparse b/l. Skin temperature gradient within normal limits b/l.  Dermatological Examination: Pedal skin with normal turgor, texture and tone bilaterally. No open wounds bilaterally. No interdigital macerations bilaterally. Toenails 1-5 b/l elongated, discolored, dystrophic, thickened, crumbly with subungual debris and tenderness to dorsal palpation.  Incurvated nailplate right great toe medial border(s) with tenderness to palpation. No erythema, no edema, no drainage noted.+Nail border hypertrophy.   Musculoskeletal: Normal muscle strength 5/5 to all lower extremity muscle groups bilaterally. No pain crepitus or joint limitation noted with ROM b/l. No gross bony deformities bilaterally. Hallux valgus with bunion deformity noted b/l  lower extremities. Patient ambulates independent of any assistive aids.  Neurological Examination: Protective sensation intact 5/5 intact bilaterally with 10g monofilament b/l. Vibratory sensation intact b/l. Proprioception intact bilaterally.  Assessment: 1. Pain due to onychomycosis of nail   2. Porokeratosis   3. Ingrown toenail without infection   4. Type 2 diabetes mellitus with  stage 3 chronic kidney disease, without long-term current use of insulin, unspecified whether stage 3a or 3b CKD ([facility])    Plan: -Examined patient. -Toenails 1-5 b/l were debrided in length and girth with sterile nail nippers and dremel without iatrogenic bleeding. Offending nail border debrided and curretaged right hallux. Border cleansed with alcohol and triple antibiotic applied. He is to apply TAO to right great toe once daily for one week. -Painful porokeratotic lesion(s) plantar aspect right heel pared and enucleated with sterile scalpel blade without incident. -Patient to continue soft, supportive shoe gear daily. -Patient to report any pedal injuries to medical professional immediately. -Patient/POA to call should there be question/concern in the interim.  Return in about 3 months (around 10/08/2019) for diabetic nail and callus trim.  Marzetta Board, DPM

## 2019-08-08 NOTE — Progress Notes (Signed)
CARDIOLOGY OFFICE NOTE  Date:  08/17/2019    [name] Date of Birth: [birth year] Medical Record #[id number]  PCP:  [name], MD  Cardiologist:  Gillian Shields    No chief complaint on file.   History of Present Illness: Gregg Flores is a 74 y.o. male who presents today for f/u    He has a history of HTN, PAF, HLD and a dilated aortic root. He has had prior TEE/DCC back in 2017. CHADSVASC of at least 3. Stomach upset with Xarelto and turned down for patient assistance with Eliquis but remains on Eliquis.    CTA 02/16/19 with 4.4 cm ascending thoracic aorta   No complaints Primary checks labs frequently Has had COVID vaccine  No chest pain, palpitations, dyspnea or syncope Compliant with meds No bleeding issues on anticoagulation    Past Medical History:  Diagnosis Date  . BPH (benign prostatic hyperplasia)   . Cataracts, bilateral   . Chronic LBP   . Colon polyps   . Diverticulosis   . ED (erectile dysfunction)   . Essential hypertension, benign 1990  . Microalbuminuria   . Monoclonal gammopathy 04/2008  . Type II or unspecified type diabetes mellitus without mention of complication, not stated as uncontrolled     Past Surgical History:  Procedure Laterality Date  . CARDIOVERSION N/A 12/08/2015   Procedure: CARDIOVERSION;  Surgeon: Larey Dresser, MD;  Location: [facility] ENDOSCOPY;  Service: Cardiovascular;  Laterality: N/A;  . TEE WITHOUT CARDIOVERSION N/A 12/08/2015   Procedure: TRANSESOPHAGEAL ECHOCARDIOGRAM (TEE);  Surgeon: Larey Dresser, MD;  Location: [facility] ENDOSCOPY;  Service: Cardiovascular;  Laterality: N/A;  . TRANSURETHRAL RESECTION OF PROSTATE       Medications: No outpatient medications have been marked as taking for the 08/17/19 encounter (Telemedicine) with Josue Hector, MD.     Allergies: Allergies  Allergen Reactions  . Shrimp [Shellfish Allergy] Other (See Comments)    Stomach issues    Social History: The patient   reports that he has been smoking cigarettes. He has a 43.00 pack-year smoking history. He has never used smokeless tobacco. He reports that he does not drink alcohol and does not use drugs.   Family History: The patient's family history includes Diabetes in his brother; Heart disease in his mother; Hypertension in his mother.   Review of Systems: Please see the history of present illness.   All other systems are reviewed and negative.   Physical Exam: VS:  BP 128/80   Ht 5\' 11"  (1.803 m)   Wt 233 lb (105.7 kg)   BMI 32.50 kg/m  .  BMI Body mass index is 32.5 kg/m.  Wt Readings from Last 3 Encounters:  08/17/19 233 lb (105.7 kg)  02/11/19 233 lb 12.8 oz (106.1 kg)  09/06/18 236 lb 6.4 oz (107.2 kg)    Phone no exam    LABORATORY DATA:  EKG:  sinus bradycardia with PAC. HR is 54.  Lab Results  Component Value Date   WBC 5.9 02/11/2019   HGB 13.8 02/11/2019   HCT 43.6 02/11/2019   PLT 269 02/11/2019   GLUCOSE 103 (H) 02/11/2019   CHOL 105 02/11/2019   TRIG 126 02/11/2019   HDL 29 (L) 02/11/2019   LDLCALC 53 02/11/2019   ALT 13 02/11/2019   AST 13 02/11/2019   NA 138 02/11/2019   K 4.4 02/11/2019   CL 99 02/11/2019   CREATININE 1.02 02/11/2019   BUN 15 02/11/2019  CO2 22 02/11/2019   TSH 1.450 08/25/2018   INR 1.1 11/24/2015   HGBA1C 7.0 (H) 02/05/2017   MICROALBUR 7.4 11/16/2014    Other Studies Reviewed Today:  CTA CHEST IMPRESSION:  02/16/19  4.4 cm  IMPRESSION: Stable appearance of the ascending aortic aneurysm. Recommend annual imaging followup by CTA or MRA. This recommendation follows 2010 ACCF/AHA/AATS/ACR/ASA/SCA/SCAI/SIR/STS/SVM Guidelines for the Diagnosis and Management of Patients with Thoracic Aortic Disease. Circulation. 2010; 121: K093-G182. Aortic aneurysm NOS (ICD10-I71.9)  Aortic Atherosclerosis (ICD10-I70.0) and Emphysema (ICD10-J43.9).   Procedure: TEE 2017 Findings: Please see echo section for full report. The patient was in  rapid atrial fibrillation. Normal LV size with moderate LV hypertrophy. EF 45%, diffuse hypokinesis. Normal RV size and systolic function. Mild left atrial enlargement, no LA appendage thrombus. Normal right atrium. Trileaflet aortic valve with no stenosis or regurgitation. Trivial MR. There appeared to be a loose chord attached to the anterior mitral leaflet. However, there was only mild mitral regurgitation. No PFO/ASD, negative bubble study. 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    May proceed to DCCV. Would repeat echo in sinus rhythm to confirm EF.   Loralie Champagne 12/08/2015   ECHO 08/09/15 Study Conclusions  - Left ventricle: The cavity size was normal. Systolic function was normal. The estimated ejection fraction was in the range of 55% to 60%. Wall motion was normal; there were no regional wall motion abnormalities. The study was not technically sufficient to allow evaluation of LV diastolic dysfunction due to atrial fibrillation. - Aortic valve: Trileaflet; mildly thickened, mildly calcified leaflets. Moderate focal calcification involving the noncoronary cusp. - Aorta: Aortic root dimension: 39 mm (ED). Ascending aorta diameter: 4.39 mm (ED). - Aortic root: The aortic root was mildly dilated. - Ascending aorta: The ascending aorta was mildly dilated. - Mitral valve: MIldly calcified chordae tendinae - Right ventricle: The cavity size was mildly dilated. Wall thickness was normal.   Procedure: Electrical Cardioversion 2017 Indications:Atrial Fibrillation  Procedure Details Consent: Risks of procedure as well as the alternatives and risks of each were explained to the (patient/caregiver). Consent for procedure obtained. Time [id number] patient identification, verified procedure, site/side was marked, verified correct patient position, special equipment/implants  available, medications/allergies/relevent history reviewed, required imaging and test results available. Performed  Patient placed on cardiac monitor, pulse oximetry, supplemental oxygen as necessary.  Sedation given: Propofol per anesthesiology Pacer pads placed anterior and posterior chest.  Cardioverted 1time(s).  Cardioverted at [place].  Evaluation Findings: Post procedure EKG shows: NSR Complications: None Patient didtolerate procedure well.   Loralie Champagne 12/08/2015, 12:35 PM   Assessment & Plan:  1. PAF - prior cardioversion 11/2015 - remains in sinus  CHADSVASC of at least 4 - no changes made today.   2. HTN - BP is good - no changes made today.   3. HLD - on statin -  LDL 53   4. DM - per PCP  5. Dilated ascending aorta - 4.4 cm by CT 02/16/19 f/u CT January 2022   6. Chronic anticoagulation - no problems noted - prior hematuria that is resolved - surveillance lab today..   7. Tobacco abuse -  Counseled on cessation < 10 minutes not motivated to quit Lung fields on CT 02/16/19 ok some emphysema   8. COVID-19 Education: The signs and symptoms of COVID-19 were discussed with the patient and how to seek care for testing (follow up with PCP or arrange E-visit).  The  importance of social distancing, staying at home, hand hygiene and wearing a mask when out in public were discussed today.  Current medicines are reviewed with the patient today.  The patient does not have concerns regarding medicines other than what has been noted above.  The following changes have been made:  See above.  Labs/ tests ordered today include:   Chest CTA January 2022 f/u aortic aneurysm   No orders of the defined types were placed in this encounter.    Disposition:   FU with cardiology in 6 months   Time:  Spent reviewing chart , composing note and direct patient interview 20 minutes   Signed: Jenkins Rouge, MD  08/17/2019 8:10 AM  [facility] [address] [place], [place]  [address] Phone: [phone] Fax: [phone]

## 2019-08-13 ENCOUNTER — Telehealth: Payer: Self-pay | Admitting: *Deleted

## 2019-08-13 NOTE — Telephone Encounter (Signed)
  Patient Consent for Virtual Visit         [name] has provided verbal consent on 08/13/2019 for a virtual visit (video or telephone).   CONSENT FOR VIRTUAL VISIT FOR:  Gregg Flores  By participating in this virtual visit I agree to the following:  I hereby voluntarily request, consent and authorize [facility] and its employed or contracted physicians, physician assistants, nurse practitioners or other licensed health care professionals (the Practitioner), to provide me with telemedicine health care services (the "Services") as deemed necessary by the treating Practitioner. I acknowledge and consent to receive the Services by the Practitioner via telemedicine. I understand that the telemedicine visit will involve communicating with the Practitioner through live audiovisual communication technology and the disclosure of certain medical information by electronic transmission. I acknowledge that I have been given the opportunity to request an in-person assessment or other available alternative prior to the telemedicine visit and am voluntarily participating in the telemedicine visit.  I understand that I have the right to withhold or withdraw my consent to the use of telemedicine in the course of my care at any time, without affecting my right to future care or treatment, and that the Practitioner or I may terminate the telemedicine visit at any time. I understand that I have the right to inspect all information obtained and/or recorded in the course of the telemedicine visit and may receive copies of available information for a reasonable fee.  I understand that some of the potential risks of receiving the Services via telemedicine include:  Marland Kitchen Delay or interruption in medical evaluation due to technological equipment failure or disruption; . Information transmitted may not be sufficient (e.g. poor resolution of images) to allow for appropriate medical decision making by the  Practitioner; and/or  . In rare instances, security protocols could fail, causing a breach of personal health information.  Furthermore, I acknowledge that it is my responsibility to provide information about my medical history, conditions and care that is complete and accurate to the best of my ability. I acknowledge that Practitioner's advice, recommendations, and/or decision may be based on factors not within their control, such as incomplete or inaccurate data provided by me or distortions of diagnostic images or specimens that may result from electronic transmissions. I understand that the practice of medicine is not an exact science and that Practitioner makes no warranties or guarantees regarding treatment outcomes. I acknowledge that a copy of this consent can be made available to me via my patient portal ([place]), or I can request a printed copy by calling the office of [facility].    I understand that my insurance will be billed for this visit.   I have read or had this consent read to me. . I understand the contents of this consent, which adequately explains the benefits and risks of the Services being provided via telemedicine.  . I have been provided ample opportunity to ask questions regarding this consent and the Services and have had my questions answered to my satisfaction. . I give my informed consent for the services to be provided through the use of telemedicine in my medical care

## 2019-08-17 ENCOUNTER — Telehealth (INDEPENDENT_AMBULATORY_CARE_PROVIDER_SITE_OTHER): Payer: Medicare Other | Admitting: Cardiovascular Disease

## 2019-08-17 ENCOUNTER — Other Ambulatory Visit: Payer: Self-pay

## 2019-08-17 VITALS — BP 128/80 | Ht 71.0 in | Wt 233.0 lb

## 2019-08-17 DIAGNOSIS — I712 Thoracic aortic aneurysm, without rupture, unspecified: Secondary | ICD-10-CM

## 2019-08-17 DIAGNOSIS — I10 Essential (primary) hypertension: Secondary | ICD-10-CM | POA: Diagnosis not present

## 2019-08-17 DIAGNOSIS — I480 Paroxysmal atrial fibrillation: Secondary | ICD-10-CM

## 2019-08-17 NOTE — Patient Instructions (Signed)
Medication Instructions:  *If you need a refill on your cardiac medications before your next appointment, please call your pharmacy*  Lab Work: If you have labs (blood work) drawn today and your tests are completely normal, you will receive your results only by: Marland Kitchen MyChart Message (if you have MyChart) OR . A paper copy in the mail If you have any lab test that is abnormal or we need to change your treatment, we will call you to review the results.  Testing/Procedures:  CTA of chest scanning in January , (CAT scanning), is a noninvasive, special x-ray that produces cross-sectional images of the body using x-rays and a computer. CT scans help physicians diagnose and treat medical conditions. For some CT exams, a contrast material is used to enhance visibility in the area of the body being studied. CT scans provide greater clarity and reveal more details than regular x-ray exams.  Follow-Up: At [facility], you and your health needs are our priority.  As part of our continuing mission to provide you with exceptional heart care, we have created designated Provider Care Teams.  These Care Teams include your primary Cardiologist (physician) and Advanced Practice Providers (APPs -  Physician Assistants and Nurse Practitioners) who all work together to provide you with the care you need, when you need it.  We recommend signing up for the patient portal called "MyChart".  Sign up information is provided on this After Visit Summary.  MyChart is used to connect with patients for Virtual Visits (Telemedicine).  Patients are able to view lab/test results, encounter notes, upcoming appointments, etc.  Non-urgent messages can be sent to your provider as well.   To learn more about what you can do with MyChart, go to [email or web address].    Your next appointment:   6 month(s)  The format for your next appointment:   In Person  Provider:   You may see Jenkins Rouge, MD or one of the following Advanced  Practice Providers on your designated Care Team:    Truitt Merle, NP  Cecilie Kicks, NP  Kathyrn Drown, NP

## 2019-10-19 ENCOUNTER — Ambulatory Visit (INDEPENDENT_AMBULATORY_CARE_PROVIDER_SITE_OTHER): Payer: Medicare Other | Admitting: Podiatry

## 2019-10-19 ENCOUNTER — Encounter: Payer: Self-pay | Admitting: Podiatry

## 2019-10-19 ENCOUNTER — Other Ambulatory Visit: Payer: Self-pay

## 2019-10-19 DIAGNOSIS — E119 Type 2 diabetes mellitus without complications: Secondary | ICD-10-CM

## 2019-10-19 DIAGNOSIS — M79676 Pain in unspecified toe(s): Secondary | ICD-10-CM

## 2019-10-19 DIAGNOSIS — N183 Chronic kidney disease, stage 3 unspecified: Secondary | ICD-10-CM | POA: Diagnosis not present

## 2019-10-19 DIAGNOSIS — B351 Tinea unguium: Secondary | ICD-10-CM | POA: Diagnosis not present

## 2019-10-19 DIAGNOSIS — E1122 Type 2 diabetes mellitus with diabetic chronic kidney disease: Secondary | ICD-10-CM | POA: Diagnosis not present

## 2019-10-19 DIAGNOSIS — M2012 Hallux valgus (acquired), left foot: Secondary | ICD-10-CM

## 2019-10-19 DIAGNOSIS — Q828 Other specified congenital malformations of skin: Secondary | ICD-10-CM

## 2019-10-19 DIAGNOSIS — M2011 Hallux valgus (acquired), right foot: Secondary | ICD-10-CM | POA: Diagnosis not present

## 2019-10-19 NOTE — Patient Instructions (Addendum)
Diabetes Mellitus and Foot Care Foot care is an important part of your health, especially when you have diabetes. Diabetes may cause you to have problems because of poor blood flow (circulation) to your feet and legs, which can cause your skin to:  Become thinner and drier.  Break more easily.  Heal more slowly.  Peel and crack. You may also have nerve damage (neuropathy) in your legs and feet, causing decreased feeling in them. This means that you may not notice minor injuries to your feet that could lead to more serious problems. Noticing and addressing any potential problems early is the best way to prevent future foot problems. How to care for your feet Foot hygiene  Wash your feet daily with warm water and mild soap. Do not use hot water. Then, pat your feet and the areas between your toes until they are completely dry. Do not soak your feet as this can dry your skin.  Trim your toenails straight across. Do not dig under them or around the cuticle. File the edges of your nails with an emery board or nail file.  Apply a moisturizing lotion or petroleum jelly to the skin on your feet and to dry, brittle toenails. Use lotion that does not contain alcohol and is unscented. Do not apply lotion between your toes. Shoes and socks  Wear clean socks or stockings every day. Make sure they are not too tight. Do not wear knee-high stockings since they may decrease blood flow to your legs.  Wear shoes that fit properly and have enough cushioning. Always look in your shoes before you put them on to be sure there are no objects inside.  To break in new shoes, wear them for just a few hours a day. This prevents injuries on your feet. Wounds, scrapes, corns, and calluses  Check your feet daily for blisters, cuts, bruises, sores, and redness. If you cannot see the bottom of your feet, use a mirror or ask someone for help.  Do not cut corns or calluses or try to remove them with medicine.  If you  find a minor scrape, cut, or break in the skin on your feet, keep it and the skin around it clean and dry. You may clean these areas with mild soap and water. Do not clean the area with peroxide, alcohol, or iodine.  If you have a wound, scrape, corn, or callus on your foot, look at it several times a day to make sure it is healing and not infected. Check for: ? Redness, swelling, or pain. ? Fluid or blood. ? Warmth. ? Pus or a bad smell. General instructions  Do not cross your legs. This may decrease blood flow to your feet.  Do not use heating pads or hot water bottles on your feet. They may burn your skin. If you have lost feeling in your feet or legs, you may not know this is happening until it is too late.  Protect your feet from hot and cold by wearing shoes, such as at the beach or on hot pavement.  Schedule a complete foot exam at least once a year (annually) or more often if you have foot problems. If you have foot problems, report any cuts, sores, or bruises to your health care provider immediately. Contact a health care provider if:  You have a medical condition that increases your risk of infection and you have any cuts, sores, or bruises on your feet.  You have an injury that is not   healing.  You have redness on your legs or feet.  You feel burning or tingling in your legs or feet.  You have pain or cramps in your legs and feet.  Your legs or feet are numb.  Your feet always feel cold.  You have pain around a toenail. Get help right away if:  You have a wound, scrape, corn, or callus on your foot and: ? You have pain, swelling, or redness that gets worse. ? You have fluid or blood coming from the wound, scrape, corn, or callus. ? Your wound, scrape, corn, or callus feels warm to the touch. ? You have pus or a bad smell coming from the wound, scrape, corn, or callus. ? You have a fever. ? You have a red line going up your leg. Summary  Check your feet every day  for cuts, sores, red spots, swelling, and blisters.  Moisturize feet and legs daily.  Wear shoes that fit properly and have enough cushioning.  If you have foot problems, report any cuts, sores, or bruises to your health care provider immediately.  Schedule a complete foot exam at least once a year (annually) or more often if you have foot problems. This information is not intended to replace advice given to you by your health care provider. Make sure you discuss any questions you have with your health care provider. Document Revised: 10/08/2018 Document Reviewed: 02/17/2016 Elsevier Patient Education  [place] are small areas of thickened skin that occur on the top, sides, or tip of a toe. They contain a cone-shaped core with a point that can press on a nerve below. This causes pain.  Calluses are areas of thickened skin that can occur anywhere on the body, including the hands, fingers, palms, soles of the feet, and heels. Calluses are usually larger than corns. What are the causes? Corns and calluses are caused by rubbing (friction) or pressure, such as from shoes that are too tight or do not fit properly. What increases the risk? Corns are more likely to develop in people who have misshapen toes (toe deformities), such as hammer toes. Calluses can occur with friction to any area of the skin. They are more likely to develop in people who:  Work with their hands.  Wear shoes that fit poorly, are too tight, or are high-heeled.  Have toe deformities. What are the signs or symptoms? Symptoms of a corn or callus include:  A hard growth on the skin.  Pain or tenderness under the skin.  Redness and swelling.  Increased discomfort while wearing tight-fitting shoes, if your feet are affected. If a corn or callus becomes infected, symptoms may include:  Redness and swelling that gets worse.  Pain.  Fluid, blood, or pus draining from the corn  or callus. How is this diagnosed? Corns and calluses may be diagnosed based on your symptoms, your medical history, and a physical exam. How is this treated? Treatment for corns and calluses may include:  Removing the cause of the friction or pressure. This may involve: ? Changing your shoes. ? Wearing shoe inserts (orthotics) or other protective layers in your shoes, such as a corn pad. ? Wearing gloves.  Applying medicine to the skin (topical medicine) to help soften skin in the hardened, thickened areas.  Removing layers of dead skin with a file to reduce the size of the corn or callus.  Removing the corn or callus with a scalpel or laser.  Taking antibiotic medicines, if your corn or callus is infected.  Having surgery, if a toe deformity is the cause. Follow these instructions at home:   Take over-the-counter and prescription medicines only as told by your health care provider.  If you were prescribed an antibiotic, take it as told by your health care provider. Do not stop taking it even if your condition starts to improve.  Wear shoes that fit well. Avoid wearing high-heeled shoes and shoes that are too tight or too loose.  Wear any padding, protective layers, gloves, or orthotics as told by your health care provider.  Soak your hands or feet and then use a file or pumice stone to soften your corn or callus. Do this as told by your health care provider.  Check your corn or callus every day for symptoms of infection. Contact a health care provider if you:  Notice that your symptoms do not improve with treatment.  Have redness or swelling that gets worse.  Notice that your corn or callus becomes painful.  Have fluid, blood, or pus coming from your corn or callus.  Have new symptoms. Summary  Corns are small areas of thickened skin that occur on the top, sides, or tip of a toe.  Calluses are areas of thickened skin that can occur anywhere on the body, including the  hands, fingers, palms, and soles of the feet. Calluses are usually larger than corns.  Corns and calluses are caused by rubbing (friction) or pressure, such as from shoes that are too tight or do not fit properly.  Treatment may include wearing any padding, protective layers, gloves, or orthotics as told by your health care provider. This information is not intended to replace advice given to you by your health care provider. Make sure you discuss any questions you have with your health care provider. Document Revised: 05/07/2018 Document Reviewed: 11/28/2016 Elsevier Patient Education  2020 [employer] An ingrown toenail occurs when the corner or sides of a toenail grow into the surrounding skin. This causes discomfort and pain. The big toe is most commonly affected, but any of the toes can be affected. If an ingrown toenail is not treated, it can become infected. What are the causes? This condition may be caused by:  Wearing shoes that are too small or tight.  An injury, such as stubbing your toe or having your toe stepped on.  Improper cutting or care of your toenails.  Having nail or foot abnormalities that were present from birth (congenital abnormalities), such as having a nail that is too big for your toe. What increases the risk? The following factors may make you more likely to develop ingrown toenails:  Age. Nails tend to get thicker with age, so ingrown nails are more common among older people.  Cutting your toenails incorrectly, such as cutting them very short or cutting them unevenly. An ingrown toenail is more likely to get infected if you have:  Diabetes.  Blood flow (circulation) problems. What are the signs or symptoms? Symptoms of an ingrown toenail may include:  Pain, soreness, or tenderness.  Redness.  Swelling.  Hardening of the skin that surrounds the toenail. Signs that an ingrown toenail may be infected include:  Fluid or  pus.  Symptoms that get worse instead of better. How is this diagnosed? An ingrown toenail may be diagnosed based on your medical history, your symptoms, and a physical exam. If you have fluid or blood coming from your toenail,  a sample may be collected to test for the specific type of bacteria that is causing the infection. How is this treated? Treatment depends on how severe your ingrown toenail is. You may be able to care for your toenail at home.  If you have an infection, you may be prescribed antibiotic medicines.  If you have fluid or pus draining from your toenail, your health care provider may drain it.  If you have trouble walking, you may be given crutches to use.  If you have a severe or infected ingrown toenail, you may need a procedure to remove part or all of the nail. Follow these instructions at home: Foot care   Do not pick at your toenail or try to remove it yourself.  Soak your foot in warm, soapy water. Do this for 20 minutes, 3 times a day, or as often as told by your health care provider. This helps to keep your toe clean and keep your skin soft.  Wear shoes that fit well and are not too tight. Your health care provider may recommend that you wear open-toed shoes while you heal.  Trim your toenails regularly and carefully. Cut your toenails straight across to prevent injury to the skin at the corners of the toenail. Do not cut your nails in a curved shape.  Keep your feet clean and dry to help prevent infection. Medicines  Take over-the-counter and prescription medicines only as told by your health care provider.  If you were prescribed an antibiotic, take it as told by your health care provider. Do not stop taking the antibiotic even if you start to feel better. Activity  Return to your normal activities as told by your health care provider. Ask your health care provider what activities are safe for you.  Avoid activities that cause pain. General  instructions  If your health care provider told you to use crutches to help you move around, use them as instructed.  Keep all follow-up visits as told by your health care provider. This is important. Contact a health care provider if:  You have more redness, swelling, pain, or other symptoms that do not improve with treatment.  You have fluid, blood, or pus coming from your toenail. Get help right away if:  You have a red streak on your skin that starts at your foot and spreads up your leg.  You have a fever. Summary  An ingrown toenail occurs when the corner or sides of a toenail grow into the surrounding skin. This causes discomfort and pain. The big toe is most commonly affected, but any of the toes can be affected.  If an ingrown toenail is not treated, it can become infected.  Fluid or pus draining from your toenail is a sign of infection. Your health care provider may need to drain it. You may be given antibiotics to treat the infection.  Trimming your toenails regularly and properly can help you prevent an ingrown toenail. This information is not intended to replace advice given to you by your health care provider. Make sure you discuss any questions you have with your health care provider. Document Revised: 05/09/2018 Document Reviewed: 10/03/2016 Elsevier Patient Education  [place].

## 2019-10-19 NOTE — Progress Notes (Signed)
Subjective: Gregg Flores presents today for for annual diabetic foot examination and painful porokeratotic lesion(s) right heel and painful mycotic toenails that limit ambulation. Painful toenails interfere with ambulation. Aggravating factors include wearing enclosed shoe gear. Pain is relieved with periodic professional debridement. Painful porokeratotic lesions are aggravated when weightbearing with and without shoegear. Pain is relieved with periodic professional debridement.Gregg Flores, Maebelle Munroe, MD is patient's PCP. Last visit was 09/25/2019 per patient recall.  Patient states he still smokes 1/2 pack cigarettes per day. Doesn't check his blood sugar on a regualr basis and states his PCP is aware.  Past Medical History:  Diagnosis Date  . BPH (benign prostatic hyperplasia)   . Cataracts, bilateral   . Chronic LBP   . Colon polyps   . Diverticulosis   . ED (erectile dysfunction)   . Essential hypertension, benign 1990  . Microalbuminuria   . Monoclonal gammopathy 04/2008  . Type II or unspecified type diabetes mellitus without mention of complication, not stated as uncontrolled     Patient Active Problem List   Diagnosis Date Noted  . 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 BMI 35.0-35.9,adult 05/18/2014  . History of hepatitis C 06/15/2012  . Hyperlipidemia LDL goal <70 04/06/2012  . Tobacco use 09/25/2011  . Essential hypertension, benign   . Chronic low back pain   . Type 2 diabetes mellitus with renal manifestations ([place])   . Diverticulosis   . Benign prostatic hyperplasia with urinary obstruction   . Monoclonal gammopathy   . Cataracts, bilateral   . Erectile dysfunction due to arterial insufficiency   . Microalbuminuria   . Benign neoplasm of colon 07/01/2008    Past Surgical History:  Procedure Laterality Date  . CARDIOVERSION N/A 12/08/2015   Procedure: CARDIOVERSION;  Surgeon: Larey Dresser, MD;  Location: [facility] ENDOSCOPY;  Service: Cardiovascular;   Laterality: N/A;  . TEE WITHOUT CARDIOVERSION N/A 12/08/2015   Procedure: TRANSESOPHAGEAL ECHOCARDIOGRAM (TEE);  Surgeon: Larey Dresser, MD;  Location: [facility];  Service: Cardiovascular;  Laterality: N/A;  . TRANSURETHRAL RESECTION OF PROSTATE      Current Outpatient Medications on File Prior to Visit  Medication Sig Dispense Refill  . amLODipine (NORVASC) 10 MG tablet take 1 tablet by mouth once daily 90 tablet 1  . apixaban (ELIQUIS) 5 MG TABS tablet Take 1 tablet (5 mg total) by mouth 2 (two) times daily. 180 tablet 3  . atorvastatin (LIPITOR) 40 MG tablet Take 40 mg by mouth daily.    . benazepril (LOTENSIN) 40 MG tablet take 1 tablet by mouth once daily 90 tablet 1  . cetirizine (ZYRTEC) 10 MG tablet Take 10 mg by mouth daily.    . fluticasone (FLONASE) 50 MCG/ACT nasal spray Place 2 sprays into both nostrils daily.     . hydrochlorothiazide (HYDRODIURIL) 25 MG tablet Take 1 tablet (25 mg total) by mouth every morning. 90 tablet 3  . ipratropium (ATROVENT) 0.03 % nasal spray Place 2 sprays into the nose every 12 (twelve) hours. 90 mL 3  . metFORMIN (GLUCOPHAGE) 1000 MG tablet take 1 tablet by mouth twice a day with food 180 tablet 1  . metoprolol tartrate (LOPRESSOR) 50 MG tablet take 1 tablet by mouth twice a day 180 tablet 1  . sitaGLIPtin (JANUVIA) 50 MG tablet Take 50 mg by mouth daily.    . Vitamin D, Ergocalciferol, (DRISDOL) 1.25 MG (50000 UNIT) CAPS capsule Take 50,000 Units by mouth once a week.     No  current facility-administered medications on file prior to visit.     Allergies  Allergen Reactions  . Shrimp [Shellfish Allergy] Other (See Comments)    Stomach issues    Social History   Occupational History  . 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 Smoking status: Current Every Day Smoker    Packs/day: 0.50    Years: 43.00    Pack years: 21.50    Types: Cigarettes  . Smokeless tobacco: Never Used  . Tobacco  comment: has cut back to 0.5 ppd (09/27/15) using patches  Vaping Use  . Vaping Use: Never used  Substance and Sexual Activity  . Alcohol use: No    Alcohol/week: 0.0 - 1.0 standard drinks    Comment: occasionally, wine and liquour   . Drug use: No  . Sexual activity: Yes    Birth control/protection: Condom    Family History  Problem Relation Age of Onset  . Heart disease Mother        s/p CABG  . Hypertension Mother   . Diabetes Brother   . Colon cancer Neg Hx     Immunization History  Administered Date(s) Administered  . Hepatitis A 07/15/2012  . Hepatitis A, Adult 06/30/2013  . Hepatitis B 07/15/2012  . Hepatitis B, adult 08/26/2012, 01/26/2013  . Influenza Split 09/21/2015, 10/16/2016  . Influenza-Unspecified 10/30/2011, 10/02/2013, 10/15/2014  . Pneumococcal Conjugate-13 06/30/2013  . Pneumococcal Polysaccharide-23 11/22/2004, 09/07/2010  . Tdap 12/06/2009  . Zoster 08/08/2013     Objective: There were no vitals filed for this visit.  [name] is a pleasant 74 y.o. male, obese in NAD. AAO X 3.  Vascular Examination: Capillary fill time to digits <3 seconds b/l lower extremities. Palpable pedal pulses b/l LE. Pedal hair sparse. Lower extremity skin temperature gradient within normal limits. No pain with calf compression b/l.  Dermatological Examination: Pedal skin with normal turgor, texture and tone bilaterally. No open wounds bilaterally. No interdigital macerations bilaterally. Toenails 1-5 b/l elongated, discolored, dystrophic, thickened, crumbly with subungual debris and tenderness to dorsal palpation. Porokeratotic lesion(s) plantar aspect of right heel . No erythema, no edema, no drainage, no flocculence.  Musculoskeletal Examination: Normal muscle strength 5/5 to all lower extremity muscle groups bilaterally. No pain crepitus or joint limitation noted with ROM b/l. Hallux valgus with bunion deformity noted b/l lower extremities. Patient ambulates  independent of any assistive aids.  Footwear Assessment: Does the patient wear appropriate shoes? Yes. Does the patient need inserts/orthotics? Yes.  Neurological Examination: Protective sensation intact 5/5 intact bilaterally with 10g monofilament b/l. Vibratory sensation intact b/l. Proprioception intact bilaterally. Clonus negative b/l.  No flowsheet data found.   Assessment: 1. Pain due to onychomycosis of nail   2. Porokeratosis   3. Hallux valgus, acquired, bilateral   4. Type 2 diabetes mellitus with stage 3 chronic kidney disease, without long-term current use of insulin, unspecified whether stage 3a or 3b CKD ([place])   5. Encounter for diabetic foot exam ([place])     Risk Categorization: Low Risk :  Patient has all of the following: Intact protective sensation No prior foot ulcer  No severe deformity Pedal pulses present  Plan: -Examined patient. -Diabetic foot examination performed on today's visit. -Discussed diabetic foot care principles. Literature dispensed on today. -Toenails 1-5 b/l were debrided in length and girth with sterile nail nippers and dremel without iatrogenic bleeding.  -Offending nail border debrided and curretaged R hallux utilizing sterile nail nipper and  currette. Border(s) cleansed with alcohol and triple antibiotic ointment applied. Patient instructed to apply triple antibiotic ointment to R hallux once daily for 7 days. -Painful porokeratotic lesion(s) right foot pared and enucleated with sterile scalpel blade without incident. -Patient to report any pedal injuries to medical professional immediately. -Patient to continue soft, supportive shoe gear daily. -Patient/POA to call should there be question/concern in the interim.  Return in about 3 months (around 01/18/2020).  Marzetta Board, DPM

## 2020-01-13 ENCOUNTER — Other Ambulatory Visit: Payer: Self-pay | Admitting: Urology

## 2020-01-13 DIAGNOSIS — N281 Cyst of kidney, acquired: Secondary | ICD-10-CM

## 2020-01-19 ENCOUNTER — Encounter: Payer: Self-pay | Admitting: Podiatry

## 2020-01-19 ENCOUNTER — Ambulatory Visit: Payer: Medicare Other | Admitting: Podiatry

## 2020-01-19 ENCOUNTER — Other Ambulatory Visit: Payer: Self-pay

## 2020-01-19 DIAGNOSIS — B351 Tinea unguium: Secondary | ICD-10-CM

## 2020-01-19 DIAGNOSIS — M2011 Hallux valgus (acquired), right foot: Secondary | ICD-10-CM

## 2020-01-19 DIAGNOSIS — M79609 Pain in unspecified limb: Secondary | ICD-10-CM | POA: Diagnosis not present

## 2020-01-19 DIAGNOSIS — M2012 Hallux valgus (acquired), left foot: Secondary | ICD-10-CM

## 2020-01-19 DIAGNOSIS — N183 Chronic kidney disease, stage 3 unspecified: Secondary | ICD-10-CM

## 2020-01-19 DIAGNOSIS — E1122 Type 2 diabetes mellitus with diabetic chronic kidney disease: Secondary | ICD-10-CM

## 2020-01-19 NOTE — Progress Notes (Signed)
Subjective: Gregg Flores presents today for preventative diabetic foot care and painful thick toenails that are difficult to trim. Pain interferes with ambulation. Aggravating factors include wearing enclosed shoe gear. Pain is relieved with periodic professional debridement.Darron Doom, Maebelle Munroe, MD is patient's PCP. Last visit was 09/14/2019 per patient recall.  Patient states he still smokes 1/2 pack cigarettes per day. Doesn't check his blood sugar on a regualr basis and states his PCP is aware.  Past Medical History:  Diagnosis Date  . BPH (benign prostatic hyperplasia)   . Cataracts, bilateral   . Chronic LBP   . Colon polyps   . Diverticulosis   . ED (erectile dysfunction)   . Essential hypertension, benign 1990  . Microalbuminuria   . Monoclonal gammopathy 04/2008  . Type II or unspecified type diabetes mellitus without mention of complication, not stated as uncontrolled     Patient Active Problem List   Diagnosis Date Noted  . Aneurysm of thoracic aorta ([place]) 02/06/2017  . BMI 35.0-35.9,adult 05/18/2014  . History of hepatitis C 06/15/2012  . Hyperlipidemia LDL goal <70 04/06/2012  . Tobacco use 09/25/2011  . Essential hypertension, benign   . Chronic low back pain   . Type 2 diabetes mellitus with renal manifestations ([place])   . Diverticulosis   . Benign prostatic hyperplasia with urinary obstruction   . Monoclonal gammopathy   . Cataracts, bilateral   . Erectile dysfunction due to arterial insufficiency   . Microalbuminuria   . Benign neoplasm of colon 07/01/2008    Past Surgical History:  Procedure Laterality Date  . CARDIOVERSION N/A 12/08/2015   Procedure: CARDIOVERSION;  Surgeon: Larey Dresser, MD;  Location: [facility] ENDOSCOPY;  Service: Cardiovascular;  Laterality: N/A;  . TEE WITHOUT CARDIOVERSION N/A 12/08/2015   Procedure: TRANSESOPHAGEAL ECHOCARDIOGRAM (TEE);  Surgeon: Larey Dresser, MD;  Location: [facility];  Service: Cardiovascular;  Laterality:  N/A;  . TRANSURETHRAL RESECTION OF PROSTATE      Current Outpatient Medications on File Prior to Visit  Medication Sig Dispense Refill  . amLODipine (NORVASC) 10 MG tablet take 1 tablet by mouth once daily 90 tablet 1  . apixaban (ELIQUIS) 5 MG TABS tablet Take 1 tablet (5 mg total) by mouth 2 (two) times daily. 180 tablet 3  . atorvastatin (LIPITOR) 40 MG tablet Take 40 mg by mouth daily.    . benazepril (LOTENSIN) 40 MG tablet take 1 tablet by mouth once daily 90 tablet 1  . cetirizine (ZYRTEC) 10 MG tablet Take 10 mg by mouth daily.    . fluticasone (FLONASE) 50 MCG/ACT nasal spray Place 2 sprays into both nostrils daily.     . hydrochlorothiazide (HYDRODIURIL) 25 MG tablet Take 1 tablet (25 mg total) by mouth every morning. 90 tablet 3  . ipratropium (ATROVENT) 0.03 % nasal spray Place 2 sprays into the nose every 12 (twelve) hours. 90 mL 3  . metFORMIN (GLUCOPHAGE) 1000 MG tablet take 1 tablet by mouth twice a day with food 180 tablet 1  . metoprolol tartrate (LOPRESSOR) 50 MG tablet take 1 tablet by mouth twice a day 180 tablet 1  . sitaGLIPtin (JANUVIA) 50 MG tablet Take 50 mg by mouth daily.    . Vitamin D, Ergocalciferol, (DRISDOL) 1.25 MG (50000 UNIT) CAPS capsule Take 50,000 Units by mouth once a week.     No current facility-administered medications on file prior to visit.     Allergies  Allergen Reactions  . Shrimp [Shellfish Allergy] Other (See Comments)  Stomach issues    Social History   Occupational History  . Occupation: retired-Paint Librarian, academic    Comment: [place] Use  . Smoking status: Current Every Day Smoker    Packs/day: 0.50    Years: 43.00    Pack years: 21.50    Types: Cigarettes  . Smokeless tobacco: Never Used  . Tobacco comment: has cut back to 0.5 ppd (09/27/15) using patches  Vaping Use  . Vaping Use: Never used  Substance and Sexual Activity  . Alcohol use: No    Alcohol/week: 0.0 - 1.0 standard drinks    Comment:  occasionally, wine and liquour   . Drug use: No  . Sexual activity: Yes    Birth control/protection: Condom    Family History  Problem Relation Age of Onset  . Heart disease Mother        s/p CABG  . Hypertension Mother   . Diabetes Brother   . Colon cancer Neg Hx     Immunization History  Administered Date(s) Administered  . Hepatitis A 07/15/2012  . Hepatitis A, Adult 06/30/2013  . Hepatitis B 07/15/2012  . Hepatitis B, adult 08/26/2012, 01/26/2013  . Influenza Split 09/21/2015, 10/16/2016  . Influenza-Unspecified 10/30/2011, 10/02/2013, 10/15/2014  . Pneumococcal Conjugate-13 06/30/2013  . Pneumococcal Polysaccharide-23 11/22/2004, 09/07/2010  . Tdap 12/06/2009  . Zoster 08/08/2013     Objective: There were no vitals filed for this visit.  Gregg Flores is a pleasant 74 y.o. male, obese in NAD. AAO X 3.  Vascular Examination: Capillary fill time to digits <3 seconds b/l lower extremities. Palpable pedal pulses b/l LE. Pedal hair sparse. Lower extremity skin temperature gradient within normal limits. No pain with calf compression b/l.  Dermatological Examination: Pedal skin with normal turgor, texture and tone bilaterally. No open wounds bilaterally. No interdigital macerations bilaterally. Toenails 1-5 b/l elongated, discolored, dystrophic, thickened, crumbly with subungual debris and tenderness to dorsal palpation.   Musculoskeletal Examination: Normal muscle strength 5/5 to all lower extremity muscle groups bilaterally. No pain crepitus or joint limitation noted with ROM b/l. Hallux valgus with bunion deformity noted b/l lower extremities. Patient ambulates independent of any assistive aids.  Neurological Examination: Protective sensation intact 5/5 intact bilaterally with 10g monofilament b/l. Vibratory sensation intact b/l. Proprioception intact bilaterally. Clonus negative b/l.  Assessment: 1. Pain due to onychomycosis of nail   2. Hallux valgus, acquired,  bilateral   3. Type 2 diabetes mellitus with stage 3 chronic kidney disease, without long-term current use of insulin, unspecified whether stage 3a or 3b CKD ([place])      Plan: -Examined patient. -Diabetic foot examination performed on today's visit. -Discussed diabetic foot care principles. Literature dispensed on today. -Toenails 1-5 b/l were debrided in length and girth with sterile nail nippers and dremel without iatrogenic bleeding.  -Patient to report any pedal injuries to medical professional immediately. -Patient to continue soft, supportive shoe gear daily. -Patient/POA to call should there be question/concern in the interim.  Return in about 3 months (around 04/18/2020) for nail trim.  Marzetta Board, DPM

## 2020-01-21 ENCOUNTER — Other Ambulatory Visit: Payer: Self-pay

## 2020-01-21 ENCOUNTER — Ambulatory Visit (HOSPITAL_COMMUNITY)
Admission: RE | Admit: 2020-01-21 | Discharge: 2020-01-21 | Disposition: A | Discharge status: Home / Self Care | Payer: Medicare Other | Source: Ambulatory Visit | Attending: Urology | Admitting: Urology

## 2020-01-21 DIAGNOSIS — N281 Cyst of kidney, acquired: Secondary | ICD-10-CM | POA: Diagnosis present

## 2020-02-02 ENCOUNTER — Telehealth: Payer: Self-pay | Admitting: *Deleted

## 2020-02-02 DIAGNOSIS — I712 Thoracic aortic aneurysm, without rupture, unspecified: Secondary | ICD-10-CM

## 2020-02-02 NOTE — Telephone Encounter (Signed)
Order for BMET placed for needed upcoming CT Angio Chest Aorta, per CT protocol. CT scheduling to call the pt and arrange lab appt prior to CT Angio.

## 2020-02-02 NOTE — Telephone Encounter (Signed)
-----   Message from Renee Rival, RT sent at 02/02/2020 10:10 AM EST ----- Regarding: BMET NEEDED Hey guys,  Can someone please place an order for a BMET so I can get this patient scheduled?  Thanks [employer]

## 2020-02-04 ENCOUNTER — Other Ambulatory Visit: Payer: Self-pay

## 2020-02-04 ENCOUNTER — Other Ambulatory Visit: Payer: Medicare Other

## 2020-02-04 DIAGNOSIS — I712 Thoracic aortic aneurysm, without rupture, unspecified: Secondary | ICD-10-CM

## 2020-02-04 LAB — BASIC METABOLIC PANEL
BUN/Creatinine Ratio: 16 (ref 10–24)
BUN: 14 mg/dL (ref 8–27)
CO2: 19 mmol/L — ABNORMAL LOW (ref 20–29)
Calcium: 9.3 mg/dL (ref 8.6–10.2)
Chloride: 104 mmol/L (ref 96–106)
Creatinine, Ser: 0.89 mg/dL (ref 0.76–1.27)
GFR calc Af Amer: 97 mL/min/{1.73_m2} (ref >59)
GFR calc non Af Amer: 84 mL/min/{1.73_m2} (ref >59)
Glucose: 164 mg/dL — ABNORMAL HIGH (ref 65–99)
Potassium: 4.2 mmol/L (ref 3.5–5.2)
Sodium: 140 mmol/L (ref 134–144)

## 2020-02-10 ENCOUNTER — Other Ambulatory Visit: Payer: Self-pay

## 2020-02-10 ENCOUNTER — Ambulatory Visit (INDEPENDENT_AMBULATORY_CARE_PROVIDER_SITE_OTHER)
Admission: RE | Admit: 2020-02-10 | Discharge: 2020-02-10 | Disposition: A | Discharge status: Home / Self Care | Payer: Medicare Other | Source: Ambulatory Visit | Attending: Cardiovascular Disease | Admitting: Cardiovascular Disease

## 2020-02-10 DIAGNOSIS — I480 Paroxysmal atrial fibrillation: Secondary | ICD-10-CM | POA: Diagnosis not present

## 2020-02-10 DIAGNOSIS — I10 Essential (primary) hypertension: Secondary | ICD-10-CM | POA: Diagnosis not present

## 2020-02-10 DIAGNOSIS — I712 Thoracic aortic aneurysm, without rupture, unspecified: Secondary | ICD-10-CM

## 2020-02-10 MED ORDER — IOHEXOL 350 MG/ML SOLN
100.0000 mL | Freq: Once | INTRAVENOUS | Status: AC | PRN
  Administered 2020-02-10: 100 mL via INTRAVENOUS

## 2020-02-16 NOTE — Progress Notes (Signed)
CARDIOLOGY OFFICE NOTE  Date:  02/24/2020    [name] Date of Birth: 1945/07/15 Medical Record #[id number]  PCP:  [name], MD  Cardiologist:  Gillian Shields    No chief complaint on file.   History of Present Illness: Gregg Flores is a 75 y.o. male who presents today for f/u    He has a history of HTN, PAF, HLD and a dilated aortic root. He has had prior TEE/DCC back in 2017. CHADSVASC of at least 3. Stomach upset with Xarelto and turned down for patient assistance with Eliquis but remains on Eliquis.    CTA 02/10/20 with 4.3 cm ascending thoracic aorta   Has had COVID vaccine   [employer] fan and disappointed again this year   Past Medical History:  Diagnosis Date  . BPH (benign prostatic hyperplasia)   . Cataracts, bilateral   . Chronic LBP   . Colon polyps   . Diverticulosis   . ED (erectile dysfunction)   . Essential hypertension, benign 1990  . Microalbuminuria   . Monoclonal gammopathy 04/2008  . Type II or unspecified type diabetes mellitus without mention of complication, not stated as uncontrolled     Past Surgical History:  Procedure Laterality Date  . CARDIOVERSION N/A 12/08/2015   Procedure: CARDIOVERSION;  Surgeon: Larey Dresser, MD;  Location: [facility] ENDOSCOPY;  Service: Cardiovascular;  Laterality: N/A;  . TEE WITHOUT CARDIOVERSION N/A 12/08/2015   Procedure: TRANSESOPHAGEAL ECHOCARDIOGRAM (TEE);  Surgeon: Larey Dresser, MD;  Location: [place];  Service: Cardiovascular;  Laterality: N/A;  . TRANSURETHRAL RESECTION OF PROSTATE       Medications: Current Meds  Medication Sig  . amLODipine (NORVASC) 10 MG tablet take 1 tablet by mouth once daily  . apixaban (ELIQUIS) 5 MG TABS tablet Take 1 tablet (5 mg total) by mouth 2 (two) times daily.  Marland Kitchen atorvastatin (LIPITOR) 40 MG tablet Take 40 mg by mouth daily.  . benazepril (LOTENSIN) 40 MG tablet take 1 tablet by mouth once daily  . cetirizine (ZYRTEC) 10 MG tablet Take 10  mg by mouth daily.  . fluticasone (FLONASE) 50 MCG/ACT nasal spray Place 2 sprays into both nostrils daily.   . hydrochlorothiazide (HYDRODIURIL) 25 MG tablet Take 1 tablet (25 mg total) by mouth every morning.  Marland Kitchen ipratropium (ATROVENT) 0.03 % nasal spray Place 2 sprays into the nose every 12 (twelve) hours.  . metFORMIN (GLUCOPHAGE) 1000 MG tablet take 1 tablet by mouth twice a day with food  . metoprolol tartrate (LOPRESSOR) 50 MG tablet take 1 tablet by mouth twice a day  . sitaGLIPtin (JANUVIA) 50 MG tablet Take 50 mg by mouth daily.  . Vitamin D, Ergocalciferol, (DRISDOL) 1.25 MG (50000 UNIT) CAPS capsule Take 50,000 Units by mouth once a week.     Allergies: Allergies  Allergen Reactions  . Shrimp [Shellfish Allergy] Other (See Comments)    Stomach issues    Social History: The patient  reports that he has been smoking cigarettes. He has a 21.50 pack-year smoking history. He has never used smokeless tobacco. He reports that he does not drink alcohol and does not use drugs.   Family History: The patient's family history includes Diabetes in his brother; Heart disease in his mother; Hypertension in his mother.   Review of Systems: Please see the history of present illness.   All other systems are reviewed and negative.   Physical Exam: VS:  BP 112/82   Pulse 63  Ht 5\' 11"  (1.803 m)   Wt 108.9 kg   SpO2 99%   BMI 33.47 kg/m  .  BMI Body mass index is 33.47 kg/m.  Wt Readings from Last 3 Encounters:  02/24/20 108.9 kg  08/17/19 105.7 kg  02/11/19 106.1 kg   Affect appropriate Healthy:  appears stated age 20: normal Neck supple with no adenopathy JVP normal no bruits no thyromegaly Lungs clear with no wheezing and good diaphragmatic motion Heart:  S1/S2 no murmur, no rub, gallop or click PMI normal Abdomen: benighn, BS positve, no tenderness, no AAA no bruit.  No HSM or HJR Distal pulses intact with no bruits No edema Neuro non-focal Skin warm and dry No  muscular weakness    LABORATORY DATA:  EKG:  sinus bradycardia with PAC. HR is 54. 02/24/2020 NSR PACls rate 63 bpm  Lab Results  Component Value Date   WBC 5.9 02/11/2019   HGB 13.8 02/11/2019   HCT 43.6 02/11/2019   PLT 269 02/11/2019   GLUCOSE 164 (H) 02/04/2020   CHOL 105 02/11/2019   TRIG 126 02/11/2019   HDL 29 (L) 02/11/2019   LDLCALC 53 02/11/2019   ALT 13 02/11/2019   AST 13 02/11/2019   NA 140 02/04/2020   K 4.2 02/04/2020   CL 104 02/04/2020   CREATININE 0.89 02/04/2020   BUN 14 02/04/2020   CO2 19 (L) 02/04/2020   TSH 1.450 08/25/2018   INR 1.1 11/24/2015   HGBA1C 7.0 (H) 02/05/2017   MICROALBUR 7.4 11/16/2014    Other Studies Reviewed Today:  CTA CHEST IMPRESSION:  02/10/20  4.3 cm   IMPRESSION: 1. Grossly stable 4.3 cm ascending thoracic aortic aneurysm. No dissection is noted. Recommend annual imaging followup by CTA or MRA. This recommendation follows 2010 ACCF/AHA/AATS/ACR/ASA/SCA/SCAI/SIR/STS/SVM Guidelines for the Diagnosis and Management of Patients with Thoracic Aortic Disease. Circulation. 2010; [id number]. Aortic aneurysm NOS (ICD10-I71.9). 2. Moderate to severe coronary artery calcifications are noted suggesting coronary artery disease. 3. Emphysema and aortic atherosclerosis.  Aortic Atherosclerosis (ICD10-I70.0) and Emphysema (ICD10-J43.9).   Electronically Signed   By: Marijo Conception M.D.   On: 02/10/2020 09:14  Procedure: TEE 2017 Findings: Please see echo section for full report. The patient was in rapid atrial fibrillation. Normal LV size with moderate LV hypertrophy. EF 45%, diffuse hypokinesis. Normal RV size and systolic function. Mild left atrial enlargement, no LA appendage thrombus. Normal right atrium. Trileaflet aortic valve with no stenosis or regurgitation. Trivial MR. There appeared to be a loose chord attached to the anterior mitral leaflet. However, there was only mild mitral regurgitation. No  PFO/ASD, negative bubble study. 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    May proceed to DCCV. Would repeat echo in sinus rhythm to confirm EF.   Loralie Champagne 12/08/2015   ECHO 08/09/15 Study Conclusions  - Left ventricle: The cavity size was normal. Systolic function was normal. The estimated ejection fraction was in the range of 55% to 60%. Wall motion was normal; there were no regional wall motion abnormalities. The study was not technically sufficient to allow evaluation of LV diastolic dysfunction due to atrial fibrillation. - Aortic valve: Trileaflet; mildly thickened, mildly calcified leaflets. Moderate focal calcification involving the noncoronary cusp. - Aorta: Aortic root dimension: 39 mm (ED). Ascending aorta diameter: 4.39 mm (ED). - Aortic root: The aortic root was mildly dilated. - Ascending aorta: The ascending aorta was mildly dilated. - Mitral valve: MIldly  calcified chordae tendinae - Right ventricle: The cavity size was mildly dilated. Wall thickness was normal.   Procedure: Electrical Cardioversion 2017 Indications:Atrial Fibrillation  Procedure Details Consent: Risks of procedure as well as the alternatives and risks of each were explained to the (patient/caregiver). Consent for procedure obtained. Time YIF:[id number] patient identification, verified procedure, site/side was marked, verified correct patient position, special equipment/implants available, medications/allergies/relevent history reviewed, required imaging and test results available. Performed  Patient placed on cardiac monitor, pulse oximetry, supplemental oxygen as necessary.  Sedation given: Propofol per anesthesiology Pacer pads placed anterior and posterior chest.  Cardioverted 1time(s).  Cardioverted at [place].  Evaluation Findings: Post procedure EKG shows: NSR Complications:  None Patient didtolerate procedure well.   Loralie Champagne 12/08/2015, 12:35 PM   Assessment & Plan:  1. PAF - prior cardioversion 11/2015 - remains in sinus  CHADSVASC of at least 4 - no changes made today.   2. HTN - BP is good - no changes made today.   3. HLD - on statin -  LDL 53   4. DM - per PCP  5. Dilated ascending aorta - 4.3 cm by CT 02/10/20 f/u CT in 2 years   6. Chronic anticoagulation - no bleeding issues    7. Tobacco abuse -  Counseled on cessation < 10 minutes not motivated to quit Lung fields on CT 02/16/19 ok some emphysema   8. COVID-19 Education: The signs and symptoms of COVID-19 were discussed with the patient and how to seek care for testing (follow up with PCP or arrange E-visit).  The importance of social distancing, staying at home, hand hygiene and wearing a mask when out in public were discussed today.  Current medicines are reviewed with the patient today.  The patient does not have concerns regarding medicines other than what has been noted above.  The following changes have been made:  See above.  Labs/ tests ordered today include:   None  No orders of the defined types were placed in this encounter.     F/U in a year   Signed: Jenkins Rouge, MD  02/24/2020 9:05 AM  [facility] [address] [place], [place]  [address] Phone: [phone] Fax: [phone]

## 2020-02-24 ENCOUNTER — Other Ambulatory Visit: Payer: Self-pay

## 2020-02-24 ENCOUNTER — Ambulatory Visit: Payer: Medicare Other | Admitting: Cardiovascular Disease

## 2020-02-24 ENCOUNTER — Encounter: Payer: Self-pay | Admitting: Cardiovascular Disease

## 2020-02-24 VITALS — BP 112/82 | HR 63 | Ht 71.0 in | Wt 240.0 lb

## 2020-02-24 DIAGNOSIS — I10 Essential (primary) hypertension: Secondary | ICD-10-CM | POA: Diagnosis not present

## 2020-02-24 DIAGNOSIS — I712 Thoracic aortic aneurysm, without rupture, unspecified: Secondary | ICD-10-CM

## 2020-02-24 DIAGNOSIS — I480 Paroxysmal atrial fibrillation: Secondary | ICD-10-CM

## 2020-02-24 MED ORDER — APIXABAN 5 MG PO TABS
5.0000 mg | ORAL_TABLET | Freq: Two times a day (BID) | ORAL | 3 refills | Status: DC
Start: 2020-02-24 — End: 2021-01-27

## 2020-02-24 NOTE — Addendum Note (Signed)
Addended by: Aris Georgia, Cheyenne Schumm L on: 02/24/2020 09:09 AM   Modules accepted: Orders

## 2020-02-24 NOTE — Patient Instructions (Signed)

## 2020-04-19 ENCOUNTER — Ambulatory Visit: Payer: Medicare Other | Admitting: Podiatry

## 2020-04-19 ENCOUNTER — Encounter: Payer: Self-pay | Admitting: Podiatry

## 2020-04-19 ENCOUNTER — Other Ambulatory Visit: Payer: Self-pay

## 2020-04-19 DIAGNOSIS — N183 Chronic kidney disease, stage 3 unspecified: Secondary | ICD-10-CM

## 2020-04-19 DIAGNOSIS — M79609 Pain in unspecified limb: Secondary | ICD-10-CM | POA: Diagnosis not present

## 2020-04-19 DIAGNOSIS — M2011 Hallux valgus (acquired), right foot: Secondary | ICD-10-CM

## 2020-04-19 DIAGNOSIS — E1122 Type 2 diabetes mellitus with diabetic chronic kidney disease: Secondary | ICD-10-CM

## 2020-04-19 DIAGNOSIS — B351 Tinea unguium: Secondary | ICD-10-CM

## 2020-04-19 DIAGNOSIS — M2012 Hallux valgus (acquired), left foot: Secondary | ICD-10-CM

## 2020-04-23 NOTE — Progress Notes (Signed)
Subjective: Gregg Flores presents today for preventative diabetic foot care and painful thick toenails that are difficult to trim. Pain interferes with ambulation. Aggravating factors include wearing enclosed shoe gear. Pain is relieved with periodic professional debridement.Marland Kitchen  He relates tenderness to right great toe lateral boder.  [name], MD is patient's PCP. Last visit was 09/14/2019.  Patient states he still smokes 1/2 pack cigarettes per day. He still does not check his blood sugar on a regualr basis and states his PCP is aware.  Allergies  Allergen Reactions  . Shrimp [Shellfish Allergy] Other (See Comments)    Stomach issues    Objective: There were no vitals filed for this visit.  DETROIT FRIEDEN is a pleasant 75 y.o. male, obese in NAD. AAO X 3.  Vascular Examination: Capillary fill time to digits <3 seconds b/l lower extremities. Palpable pedal pulses b/l LE. Pedal hair sparse. Lower extremity skin temperature gradient within normal limits. No pain with calf compression b/l.  Dermatological Examination: Pedal skin with normal turgor, texture and tone bilaterally. No open wounds bilaterally. No interdigital macerations bilaterally. Toenails 1-5 b/l elongated, discolored, dystrophic, thickened, crumbly with subungual debris and tenderness to dorsal palpation.   Incurvated nailplate right great toe lateral border(s) with tenderness to palpation. No erythema, no edema, no drainage noted.  Musculoskeletal Examination: Normal muscle strength 5/5 to all lower extremity muscle groups bilaterally. No pain crepitus or joint limitation noted with ROM b/l. Hallux valgus with bunion deformity noted b/l lower extremities. Patient ambulates independent of any assistive aids.  Neurological Examination: Protective sensation intact 5/5 intact bilaterally with 10g monofilament b/l. Vibratory sensation intact b/l. Proprioception intact bilaterally. Clonus negative  b/l.  Assessment: 1. Pain due to onychomycosis of nail   2. Hallux valgus, acquired, bilateral   3. Type 2 diabetes mellitus with stage 3 chronic kidney disease, without long-term current use of insulin, unspecified whether stage 3a or 3b CKD ([place])     Plan: -Examined patient. -Continue diabetic foot care principles. -Toenails 1-5 b/l were debrided in length and girth with sterile nail nippers and dremel without iatrogenic bleeding. Offending nail borders debrided and curretaged R hallux. Borders cleansed with alcohol. Antibiotic ointment applied. He was instructed to apply triple antibiotic ointment to right great toe once daily for one week. Call if he has any problems. -Patient to report any pedal injuries to medical professional immediately. -Patient to continue soft, supportive shoe gear daily. -Patient/POA to call should there be question/concern in the interim.  Return in about 3 months (around 07/20/2020).  Marzetta Board, DPM

## 2020-08-03 ENCOUNTER — Other Ambulatory Visit: Payer: Self-pay

## 2020-08-03 ENCOUNTER — Encounter: Payer: Self-pay | Admitting: Podiatry

## 2020-08-03 ENCOUNTER — Ambulatory Visit: Payer: Medicare Other | Admitting: Podiatry

## 2020-08-03 DIAGNOSIS — M79609 Pain in unspecified limb: Secondary | ICD-10-CM

## 2020-08-03 DIAGNOSIS — L600 Ingrowing nail: Secondary | ICD-10-CM | POA: Diagnosis not present

## 2020-08-03 DIAGNOSIS — B351 Tinea unguium: Secondary | ICD-10-CM | POA: Diagnosis not present

## 2020-08-03 DIAGNOSIS — N183 Chronic kidney disease, stage 3 unspecified: Secondary | ICD-10-CM

## 2020-08-03 MED ORDER — MUPIROCIN 2 % EX OINT: TOPICAL_OINTMENT | CUTANEOUS | 1 refills | Status: DC

## 2020-08-03 NOTE — Progress Notes (Signed)
Subjective: Gregg Flores is a pleasant 75 y.o. male patient seen today painful thick toenails that are difficult to trim. Pain interferes with ambulation. Aggravating factors include wearing enclosed shoe gear. Pain is relieved with periodic professional debridement.  He relates his right great toe is sore today and states it feels ingrown. Denies any redness, drainage or swelling. Most aggravated when wearing enclosed shoe gear.   Allergies  Allergen Reactions   Shrimp [Shellfish Allergy] Other (See Comments)    Stomach issues    Objective: Physical Exam  General: Gregg Flores is a pleasant 75 y.o. African American male, in NAD. AAO x 3.   Vascular:  Capillary fill time to digits <3 seconds b/l lower extremities. Palpable DP pulse(s) b/l lower extremities Palpable PT pulse(s) b/l lower extremities Pedal hair sparse. Lower extremity skin temperature gradient within normal limits. No pain with calf compression b/l. No edema noted b/l lower extremities.  Dermatological:  Pedal skin with normal turgor, texture and tone b/l lower extremities No open wounds b/l lower extremities No interdigital macerations b/l lower extremities Toenails 1-5 b/l elongated, discolored, dystrophic, thickened, crumbly with subungual debris and tenderness to dorsal palpation. Incurvated nailplate lateral border(s) R hallux.  Nail border hypertrophy present. There is tenderness to palpation. Sign(s) of infection: no clinical signs of infection noted on examination today..  Musculoskeletal:  Normal muscle strength 5/5 to all lower extremity muscle groups bilaterally. No pain crepitus or joint limitation noted with ROM b/l. Hallux valgus with bunion deformity noted b/l lower extremities.  Neurological:  Protective sensation intact 5/5 intact bilaterally with 10g monofilament b/l. Vibratory sensation intact b/l.  Assessment and Plan:  1. Pain due to onychomycosis of nail   2. Ingrown toenail without  infection   3. Type 2 diabetes mellitus with stage 3 chronic kidney disease, without long-term current use of insulin, unspecified whether stage 3a or 3b CKD ([place])     -Examined patient. -Patient to continue soft, supportive shoe gear daily. -Toenails 1-5 bilaterally debrided in length and girth without iatrogenic bleeding with sterile nail nipper and dremel.  -Offending nail border debrided and curretaged R hallux utilizing sterile nail nipper and currette. Border(s) cleansed with alcohol and triple antibiotic ointment and bandaid applied. Prescription written for Mupirocin Ointment. Patient is to apply to R hallux once daily for 7 days. Patient instructed to apply Mupriocin Ointment to R hallux once daily for 7 days. -Patient to report any pedal injuries to medical professional immediately. -Patient/POA to call should there be question/concern in the interim.  Return in about 3 months (around 11/03/2020) for diabetic nail trim.  Marzetta Board, DPM

## 2020-11-07 ENCOUNTER — Other Ambulatory Visit: Payer: Self-pay

## 2020-11-07 ENCOUNTER — Encounter: Payer: Self-pay | Admitting: Podiatry

## 2020-11-07 ENCOUNTER — Ambulatory Visit: Payer: Medicare Other | Admitting: Podiatry

## 2020-11-07 DIAGNOSIS — N183 Chronic kidney disease, stage 3 unspecified: Secondary | ICD-10-CM | POA: Diagnosis not present

## 2020-11-07 DIAGNOSIS — M79609 Pain in unspecified limb: Secondary | ICD-10-CM

## 2020-11-07 DIAGNOSIS — E1122 Type 2 diabetes mellitus with diabetic chronic kidney disease: Secondary | ICD-10-CM

## 2020-11-07 DIAGNOSIS — B351 Tinea unguium: Secondary | ICD-10-CM | POA: Diagnosis not present

## 2020-11-07 NOTE — Progress Notes (Signed)
  Subjective:  Patient ID: Gregg Flores, male    DOB: [birth year],  MRN: [id number]  Gregg Flores presents to clinic today for at risk foot care. Pt has h/o NIDDM with chronic kidney disease and painful thick toenails that are difficult to trim. Pain interferes with ambulation. Aggravating factors include wearing enclosed shoe gear. Pain is relieved with periodic professional debridement.  Patient does not monitor blood glucose daily.  He states right great toe feels better today. He applied Mupirocin Ointment as directed and has had no new problems.  PCP is [name], MD , and last visit was 3 months ago. He has an appointment next week.  Allergies  Allergen Reactions   Shrimp [Shellfish Allergy] Other (See Comments)    Stomach issues    Review of Systems: Negative except as noted in the HPI. Objective:   Constitutional Gregg Flores is a pleasant 75 y.o. African American male, obese in NAD. AAO x 3.   Vascular Capillary fill time to digits <3 seconds b/l lower extremities. Palpable DP pulse(s) b/l lower extremities Palpable PT pulse(s) b/l lower extremities Pedal hair absent. Lower extremity skin temperature gradient within normal limits. No pain with calf compression b/l. No edema noted b/l lower extremities. No cyanosis or clubbing noted.  Neurologic Normal speech. Oriented to person, place, and time. Protective sensation intact 5/5 intact bilaterally with 10g monofilament b/l. Vibratory sensation intact b/l.  Dermatologic Toenails 1-5 b/l elongated, discolored, dystrophic, thickened, crumbly with subungual debris and tenderness to dorsal palpation. Incurvated nailplate bilateral border(s) R hallux.  Nail border hypertrophy minimal. There is tenderness to palpation. Sign(s) of infection: no clinical signs of infection noted on examination today..  Orthopedic: Normal muscle strength 5/5 to all lower extremity muscle groups bilaterally. Hallux valgus with bunion deformity  noted b/l lower extremities.   Radiographs: None Assessment:   1. Pain due to onychomycosis of nail   2. Type 2 diabetes mellitus with stage 3 chronic kidney disease, without long-term current use of insulin, unspecified whether stage 3a or 3b CKD ([place])    Plan:  Patient was evaluated and treated and all questions answered. Consent given for treatment as described below: -No new findings. No new orders. -Continue diabetic foot care principles: inspect feet daily, monitor glucose as recommended by PCP and/or Endocrinologist, and follow prescribed diet per PCP, Endocrinologist and/or dietician. -Patient to continue soft, supportive shoe gear daily. -Toenails 1-5 b/l were debrided in length and girth with sterile nail nippers and dremel without iatrogenic bleeding.  -Offending nail border debrided and curretaged R hallux utilizing sterile nail nipper and currette. Border(s) cleansed with alcohol and triple antibiotic ointment applied. Patient instructed to apply Mupirocin Ointment to R hallux once daily for 7 days. -Patient to report any pedal injuries to medical professional immediately. -Patient/POA to call should there be question/concern in the interim.  Return in about 3 months (around 02/07/2021).  Marzetta Board, DPM

## 2021-01-27 ENCOUNTER — Telehealth: Payer: Self-pay | Admitting: Cardiovascular Disease

## 2021-01-27 MED ORDER — APIXABAN 5 MG PO TABS: 5.0000 mg | ORAL_TABLET | Freq: Two times a day (BID) | ORAL | 0 refills | Status: DC

## 2021-01-27 NOTE — Telephone Encounter (Signed)
°*  STAT* If patient is at the pharmacy, call can be transferred to refill team.   1. Which medications need to be refilled? (please list name of each medication and dose if known) apixaban (ELIQUIS) 5 MG TABS tablet  2. Which pharmacy/location (including street and city if local pharmacy) is medication to be sent to?[facility] [place], [place] - [address] AT [place]  3. Do they need a 30 day or 90 day supply? 90 ds

## 2021-01-27 NOTE — Telephone Encounter (Signed)
Eliquis 5 mg refill request received. Patient is 75 years old, weight- 108.9 kg, Crea- 0.89 on 02/04/20, Diagnosis- PAF, and last seen by Dr. Johnsie Cancel on 02/24/20. Dose is appropriate based on dosing criteria. Will send in refill to requested pharmacy.

## 2021-02-14 ENCOUNTER — Encounter: Payer: Self-pay | Admitting: Podiatry

## 2021-02-14 ENCOUNTER — Ambulatory Visit: Payer: Medicare Other | Admitting: Podiatry

## 2021-02-14 ENCOUNTER — Other Ambulatory Visit: Payer: Self-pay

## 2021-02-14 DIAGNOSIS — B351 Tinea unguium: Secondary | ICD-10-CM | POA: Diagnosis not present

## 2021-02-14 DIAGNOSIS — M2012 Hallux valgus (acquired), left foot: Secondary | ICD-10-CM

## 2021-02-14 DIAGNOSIS — M2041 Other hammer toe(s) (acquired), right foot: Secondary | ICD-10-CM | POA: Diagnosis not present

## 2021-02-14 DIAGNOSIS — E1122 Type 2 diabetes mellitus with diabetic chronic kidney disease: Secondary | ICD-10-CM

## 2021-02-14 DIAGNOSIS — M79609 Pain in unspecified limb: Secondary | ICD-10-CM | POA: Diagnosis not present

## 2021-02-14 DIAGNOSIS — N183 Chronic kidney disease, stage 3 unspecified: Secondary | ICD-10-CM

## 2021-02-14 DIAGNOSIS — E119 Type 2 diabetes mellitus without complications: Secondary | ICD-10-CM

## 2021-02-14 DIAGNOSIS — M2042 Other hammer toe(s) (acquired), left foot: Secondary | ICD-10-CM

## 2021-02-14 DIAGNOSIS — M2011 Hallux valgus (acquired), right foot: Secondary | ICD-10-CM | POA: Diagnosis not present

## 2021-02-14 NOTE — Progress Notes (Signed)
ANNUAL DIABETIC FOOT EXAM  Subjective: Gregg Flores presents today for for annual diabetic foot examination.  Patient relates 18 year h/o diabetes.  Patient denies any h/o foot wounds.  Patient denies any numbness, tingling, burning, or pins/needle sensation in feet. He does endorse feet feel cold at times.  Patient currently smokes and has 50 year h/o of smoking cigarettes.  Patient does not monitor blood glucose daily.  Risk factors: diabetes, hyperlipidemia.  Gregg Rasmussen, MD is patient's PCP. Last visit was 02/14/2020.  Past Medical History:  Diagnosis Date   BPH (benign prostatic hyperplasia)    Cataracts, bilateral    Chronic LBP    Colon polyps    Diverticulosis    ED (erectile dysfunction)    Essential hypertension, benign 1990   Microalbuminuria    Monoclonal gammopathy 04/2008   Type II or unspecified type diabetes mellitus without mention of complication, not stated as uncontrolled    Patient Active Problem List   Diagnosis Date Noted   Aneurysm of thoracic aorta 02/06/2017   BMI 35.0-35.9,adult 05/18/2014   History of hepatitis C 06/15/2012   Hyperlipidemia LDL goal <70 04/06/2012   Tobacco use 09/25/2011   Essential hypertension, benign    Chronic low back pain    Type 2 diabetes mellitus with renal manifestations ([place])    Diverticulosis    Benign prostatic hyperplasia with urinary obstruction    Monoclonal gammopathy    Cataracts, bilateral    Erectile dysfunction due to arterial insufficiency    Microalbuminuria    Benign neoplasm of colon 07/01/2008   Past Surgical History:  Procedure Laterality Date   CARDIOVERSION N/A 12/08/2015   Procedure: CARDIOVERSION;  Surgeon: Larey Dresser, MD;  Location: [place];  Service: Cardiovascular;  Laterality: N/A;   TEE WITHOUT CARDIOVERSION N/A 12/08/2015   Procedure: TRANSESOPHAGEAL ECHOCARDIOGRAM (TEE);  Surgeon: Larey Dresser, MD;  Location: [place];  Service: Cardiovascular;   Laterality: N/A;   TRANSURETHRAL RESECTION OF PROSTATE     Current Outpatient Medications on File Prior to Visit  Medication Sig Dispense Refill   allopurinol (ZYLOPRIM) 100 MG tablet Take 100 mg by mouth daily.     amLODipine (NORVASC) 10 MG tablet take 1 tablet by mouth once daily 90 tablet 1   apixaban (ELIQUIS) 5 MG TABS tablet Take 1 tablet (5 mg total) by mouth 2 (two) times daily. 180 tablet 0   atorvastatin (LIPITOR) 40 MG tablet Take 40 mg by mouth daily.     benazepril (LOTENSIN) 40 MG tablet take 1 tablet by mouth once daily 90 tablet 1   cetirizine (ZYRTEC) 10 MG tablet Take 10 mg by mouth daily.     finasteride (PROSCAR) 5 MG tablet Take 5 mg by mouth daily.     fluticasone (FLONASE) 50 MCG/ACT nasal spray Place 2 sprays into both nostrils daily.      hydrochlorothiazide (HYDRODIURIL) 25 MG tablet Take 1 tablet (25 mg total) by mouth every morning. 90 tablet 3   ipratropium (ATROVENT) 0.03 % nasal spray Place 2 sprays into the nose every 12 (twelve) hours. 90 mL 3   metFORMIN (GLUCOPHAGE) 1000 MG tablet take 1 tablet by mouth twice a day with food 180 tablet 1   metoprolol tartrate (LOPRESSOR) 50 MG tablet take 1 tablet by mouth twice a day 180 tablet 1   mupirocin ointment (BACTROBAN) 2 % Apply to affected toe once daily for one week. 30 g 1   sitaGLIPtin (JANUVIA) 50 MG tablet Take 50 mg  by mouth daily.     tamsulosin (FLOMAX) 0.4 MG CAPS capsule Take 0.4 mg by mouth every 12 (twelve) hours.     Vitamin D, Ergocalciferol, (DRISDOL) 1.25 MG (50000 UNIT) CAPS capsule Take 50,000 Units by mouth once a week.     No current facility-administered medications on file prior to visit.    Allergies  Allergen Reactions   Shrimp [Shellfish Allergy] Other (See Comments)    Stomach issues   Social History   Occupational History   Occupation: retired-Paint Librarian, academic    Comment: [employer]  Tobacco Use   Smoking status: Every Day    Packs/day: 0.50    Years: 43.00     Pack years: 21.50    Types: Cigarettes   Smokeless tobacco: Never   Tobacco comments:    has cut back to 0.5 ppd (09/27/15) using patches  Vaping Use   Vaping Use: Never used  Substance and Sexual Activity   Alcohol use: No    Alcohol/week: 0.0 - 1.0 standard drinks    Comment: occasionally, wine and liquour    Drug use: No   Sexual activity: Yes    Birth control/protection: Condom   Family History  Problem Relation Age of Onset   Heart disease Mother        s/p CABG   Hypertension Mother    Diabetes Brother    Colon cancer Neg Hx    Immunization History  Administered Date(s) Administered   Hepatitis A 07/15/2012   Hepatitis A, Adult 06/30/2013   Hepatitis B 07/15/2012   Hepatitis B, adult 08/26/2012, 01/26/2013   Influenza Split 09/21/2015, 10/16/2016   Influenza-Unspecified 10/30/2011, 10/02/2013, 10/15/2014   Pneumococcal Conjugate-13 06/30/2013   Pneumococcal Polysaccharide-23 11/22/2004, 09/07/2010   Tdap 12/06/2009   Zoster, Live 08/08/2013     Review of Systems: Negative except as noted in the HPI.   Objective: There were no vitals filed for this visit.  Gregg Flores is a pleasant 76 y.o. male in NAD. AAO X 3.  Vascular Examination: CFT <3 seconds b/l LE. Palpable DP/PT pulses b/l LE. Digital hair absent b/l. Skin temperature gradient WNL b/l. No pain with calf compression b/l. Trace edema noted b/l. No cyanosis or clubbing noted b/l LE.  Dermatological Examination: Pedal skin is warm and supple b/l LE. No open wounds b/l LE. No interdigital macerations noted b/l LE. Toenails 1-5 b/l elongated, discolored, dystrophic, thickened, crumbly with subungual debris and tenderness to dorsal palpation. Incurvated nailplate medial border(s) bilateral great toes.  Nail border hypertrophy minimal. There is tenderness to palpation. Sign(s) of infection: no clinical signs of infection noted on examination today..  Musculoskeletal Examination: Normal muscle strength 5/5  to all lower extremity muscle groups bilaterally. HAV with bunion deformity noted b/l LE. Hammertoe(s) noted to the bilateral 2nd toes.. No pain, crepitus or joint limitation noted with ROM b/l LE.  Patient ambulates independently without assistive aids.  Footwear Assessment: Does the patient wear appropriate shoes? Yes. Does the patient need inserts/orthotics? No.  Neurological Examination: Protective sensation intact 5/5 intact bilaterally with 10g monofilament b/l. Vibratory sensation diminished b/l.  Assessment: 1. Pain due to onychomycosis of nail   2. Hallux valgus, acquired, bilateral   3. Acquired hammertoes of both feet   4. Type 2 diabetes mellitus with stage 3 chronic kidney disease, without long-term current use of insulin, unspecified whether stage 3a or 3b CKD ([place])   5. Encounter for diabetic foot exam ([place])      ADA Risk Categorization:  Low Risk :  Patient has all of the following: Intact protective sensation No prior foot ulcer  No severe deformity Pedal pulses present  Plan: -Diabetic foot examination performed today. -Continue foot and shoe inspections daily. Monitor blood glucose per PCP/Endocrinologist's recommendations. -Mycotic toenails 1-5 bilaterally were debrided in length and girth with sterile nail nippers and dremel without incident. -Offending nail border debrided and curretaged bilateral great toes utilizing sterile nail nipper and currette. Border cleansed with alcohol and triple antibiotic applied. No further treatment required by patient/caregiver. -Patient/POA to call should there be question/concern in the interim.  Return in about 3 months (around 05/15/2021).  Marzetta Board, DPM

## 2021-04-04 NOTE — Progress Notes (Incomplete)
CARDIOLOGY OFFICE NOTE  Date:  04/04/2021    Gregg Flores Date of Birth: August 24, 1945 Medical Record #[id number]  PCP:  [name], MD  Cardiologist:  Gillian Shields    No chief complaint on file.   History of Present Illness: Gregg Flores is a 76 y.o. male who presents today for f/u     He has a history of HTN, PAF, HLD and a dilated aortic root. He has had prior TEE/DCC back in 2017. CHADSVASC of at least 3. Stomach upset with Xarelto and turned down for patient assistance with Eliquis but remains on Eliquis.    CTA 02/10/20 with 4.3 cm ascending thoracic aorta   [employer] fan and disappointed again this year   Still smoking ***  ***  Past Medical History:  Diagnosis Date   BPH (benign prostatic hyperplasia)    Cataracts, bilateral    Chronic LBP    Colon polyps    Diverticulosis    ED (erectile dysfunction)    Essential hypertension, benign 1990   Microalbuminuria    Monoclonal gammopathy 04/2008   Type II or unspecified type diabetes mellitus without mention of complication, not stated as uncontrolled     Past Surgical History:  Procedure Laterality Date   CARDIOVERSION N/A 12/08/2015   Procedure: CARDIOVERSION;  Surgeon: Larey Dresser, MD;  Location: [place];  Service: Cardiovascular;  Laterality: N/A;   TEE WITHOUT CARDIOVERSION N/A 12/08/2015   Procedure: TRANSESOPHAGEAL ECHOCARDIOGRAM (TEE);  Surgeon: Larey Dresser, MD;  [facility];  Service: Cardiovascular;  Laterality: N/A;   TRANSURETHRAL RESECTION OF PROSTATE       Medications: No outpatient medications have been marked as taking for the 04/18/21 encounter (Appointment) with Josue Hector, MD.     Allergies: Allergies  Allergen Reactions   Shrimp [Shellfish Allergy] Other (See Comments)    Stomach issues    Social History: The patient  reports that he has been smoking cigarettes. He has a 21.50 pack-year smoking history. He has never used smokeless  tobacco. He reports that he does not drink alcohol and does not use drugs.   Family History: The patient's family history includes Diabetes in his brother; Heart disease in his mother; Hypertension in his mother.   Review of Systems: Please see the history of present illness.   All other systems are reviewed and negative.   Physical Exam: VS:  There were no vitals taken for this visit. Marland Kitchen  BMI There is no height or weight on file to calculate BMI.  Wt Readings from Last 3 Encounters:  02/24/20 240 lb (108.9 kg)  08/17/19 233 lb (105.7 kg)  02/11/19 233 lb 12.8 oz (106.1 kg)   Affect appropriate Healthy:  appears stated age 49: normal Neck supple with no adenopathy JVP normal no bruits no thyromegaly Lungs clear with no wheezing and good diaphragmatic motion Heart:  S1/S2 no murmur, no rub, gallop or click PMI normal Abdomen: benighn, BS positve, no tenderness, no AAA no bruit.  No HSM or HJR Distal pulses intact with no bruits No edema Neuro non-focal Skin warm and dry No muscular weakness    LABORATORY DATA:  EKG:  sinus bradycardia with PAC. HR is 54. 04/04/2021 NSR PACls rate 63 bpm  Lab Results  Component Value Date   WBC 5.9 02/11/2019   HGB 13.8 02/11/2019   HCT 43.6 02/11/2019   PLT 269 02/11/2019   GLUCOSE 164 (H) 02/04/2020   CHOL 105 02/11/2019  TRIG 126 02/11/2019   HDL 29 (L) 02/11/2019   LDLCALC 53 02/11/2019   ALT 13 02/11/2019   AST 13 02/11/2019   NA 140 02/04/2020   K 4.2 02/04/2020   CL 104 02/04/2020   CREATININE 0.89 02/04/2020   BUN 14 02/04/2020   CO2 19 (L) 02/04/2020   TSH 1.450 08/25/2018   INR 1.1 11/24/2015   HGBA1C 7.0 (H) 02/05/2017   MICROALBUR 7.4 11/16/2014    Other Studies Reviewed Today:  CTA CHEST IMPRESSION:  02/10/20  4.3 cm    IMPRESSION: 1. Grossly stable 4.3 cm ascending thoracic aortic aneurysm. No dissection is noted. Recommend annual imaging followup by CTA or MRA. This recommendation follows  2010 ACCF/AHA/AATS/ACR/ASA/SCA/SCAI/SIR/STS/SVM Guidelines for the Diagnosis and Management of Patients with Thoracic Aortic Disease. Circulation. 2010; 121: Z610-R604. Aortic aneurysm NOS (ICD10-I71.9). 2. Moderate to severe coronary artery calcifications are noted suggesting coronary artery disease. 3. Emphysema and aortic atherosclerosis.   Aortic Atherosclerosis (ICD10-I70.0) and Emphysema (ICD10-J43.9).     Electronically Signed   By: Marijo Conception M.D.   On: 02/10/2020 09:14   Procedure: TEE 2017 Findings: Please see echo section for full report.  The patient was in rapid atrial fibrillation.  Normal LV size with moderate LV hypertrophy.  EF 45%, diffuse hypokinesis.  Normal RV size and systolic function.  Mild left atrial enlargement, no LA appendage thrombus.  Normal right atrium.  Trileaflet aortic valve with no stenosis or regurgitation.  Trivial MR.  There appeared to be a loose chord attached to the anterior mitral leaflet.  However, there was only mild mitral regurgitation.  No PFO/ASD, negative bubble study.  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       May proceed to DCCV.  Would repeat echo in sinus rhythm to confirm EF.    Loralie Champagne 12/08/2015     ECHO 08/09/15 Study Conclusions   - Left ventricle: The cavity size was normal. Systolic function was   normal. The estimated ejection fraction was in the range of 55%   to 60%. Wall motion was normal; there were no regional wall   motion abnormalities. The study was not technically sufficient to   allow evaluation of LV diastolic dysfunction due to atrial   fibrillation. - Aortic valve: Trileaflet; mildly thickened, mildly calcified   leaflets. Moderate focal calcification involving the noncoronary   cusp. - Aorta: Aortic root dimension: 39 mm (ED). Ascending aorta   diameter: 4.39 mm (ED). - Aortic root: The aortic root was mildly dilated. -  Ascending aorta: The ascending aorta was mildly dilated. - Mitral valve: MIldly calcified chordae tendinae - Right ventricle: The cavity size was mildly dilated. Wall   thickness was normal.     Procedure: Electrical Cardioversion 2017 Indications:  Atrial Fibrillation   Procedure Details Consent: Risks of procedure as well as the alternatives and risks of each were explained to the (patient/caregiver).  Consent for procedure obtained. Time Out: Verified patient identification, verified procedure, site/side was marked, verified correct patient position, special equipment/implants available, medications/allergies/relevent history reviewed, required imaging and test results available.  Performed   Patient placed on cardiac monitor, pulse oximetry, supplemental oxygen as necessary.  Sedation given: Propofol per anesthesiology Pacer pads placed anterior and posterior chest.   Cardioverted 1 time(s).  Cardioverted at [place].   Evaluation Findings: Post procedure EKG shows: NSR Complications: None Patient did tolerate procedure well.     [place] [employer]  12/08/2015, 12:35 PM     Assessment & Plan:   1. PAF - prior cardioversion 11/2015 - remains in sinus  CHADSVASC of at least 4 - stable   2. HTN - Well controlled.  Continue current medications and low sodium Dash type diet.    3. HLD - on statin -  LDL 53   4. DM - Discussed low carb diet.  Target hemoglobin A1c is 6.5 or less.  Continue current medications.  5. Dilated ascending aorta -  4.3 cm by CT 02/10/20 f/u CT January 2024   6. Chronic anticoagulation - no bleeding issues    7. Tobacco abuse -  Counseled on cessation < 10 minutes not motivated to quit Lung fields on CT 02/16/19 ok some emphysema  Lung cancer screening CT    8. COVID-19 Education: The signs and symptoms of COVID-19 were discussed with the patient and how to seek care for testing (follow up with PCP or arrange E-visit).  The importance of social distancing,  staying at home, hand hygiene and wearing a mask when out in public were discussed today.  Current medicines are reviewed with the patient today.  The patient does not have concerns regarding medicines other than what has been noted above.  Lung cancer CT     F/U in a year   Signed: Jenkins Rouge, MD  04/04/2021 10:08 AM

## 2021-04-18 ENCOUNTER — Other Ambulatory Visit: Payer: Self-pay

## 2021-04-18 ENCOUNTER — Encounter: Payer: Self-pay | Admitting: Cardiovascular Disease

## 2021-04-18 ENCOUNTER — Ambulatory Visit: Payer: Medicare Other | Admitting: Cardiovascular Disease

## 2021-04-18 VITALS — BP 136/76 | HR 60 | Ht 71.0 in | Wt 237.0 lb

## 2021-04-18 DIAGNOSIS — I7121 Aneurysm of the ascending aorta, without rupture: Secondary | ICD-10-CM | POA: Diagnosis not present

## 2021-04-18 DIAGNOSIS — I10 Essential (primary) hypertension: Secondary | ICD-10-CM

## 2021-04-18 DIAGNOSIS — Z87891 Personal history of nicotine dependence: Secondary | ICD-10-CM | POA: Diagnosis not present

## 2021-04-18 DIAGNOSIS — I480 Paroxysmal atrial fibrillation: Secondary | ICD-10-CM

## 2021-04-18 NOTE — Patient Instructions (Signed)
Medication Instructions:  ?Your physician recommends that you continue on your current medications as directed. Please refer to the Current Medication list given to you today. ? ?*If you need a refill on your cardiac medications before your next appointment, please call your pharmacy* ? ?Lab Work: ?If you have labs (blood work) drawn today and your tests are completely normal, you will receive your results only by: ?MyChart Message (if you have MyChart) OR ?A paper copy in the mail ?If you have any lab test that is abnormal or we need to change your treatment, we will call you to review the results. ? ?Testing/Procedures: ?Non-Cardiac CT scanning for lung cancer screening, (CAT scanning), is a noninvasive, special x-ray that produces cross-sectional images of the body using x-rays and a computer. CT scans help physicians diagnose and treat medical conditions. For some CT exams, a contrast material is used to enhance visibility in the area of the body being studied. CT scans provide greater clarity and reveal more details than regular x-ray exams. ? ?Follow-Up: ?At [facility], you and your health needs are our priority.  As part of our continuing mission to provide you with exceptional heart care, we have created designated Provider Care Teams.  These Care Teams include your primary Cardiologist (physician) and Advanced Practice Providers (APPs -  Physician Assistants and Nurse Practitioners) who all work together to provide you with the care you need, when you need it. ? ?We recommend signing up for the patient portal called "MyChart".  Sign up information is provided on this After Visit Summary.  MyChart is used to connect with patients for Virtual Visits (Telemedicine).  Patients are able to view lab/test results, encounter notes, upcoming appointments, etc.  Non-urgent messages can be sent to your provider as well.   ?To learn more about what you can do with MyChart, go to [email or web address].   ? ?Your  next appointment:   ?1 year(s) ? ?The format for your next appointment:   ?In Person ? ?Provider:   ?Jenkins Rouge, MD { ? ?

## 2021-04-25 ENCOUNTER — Other Ambulatory Visit: Payer: Self-pay

## 2021-04-25 ENCOUNTER — Ambulatory Visit (INDEPENDENT_AMBULATORY_CARE_PROVIDER_SITE_OTHER)
Admission: RE | Admit: 2021-04-25 | Discharge: 2021-04-25 | Disposition: A | Discharge status: Home / Self Care | Payer: Medicare Other | Source: Ambulatory Visit | Attending: Cardiovascular Disease | Admitting: Cardiovascular Disease

## 2021-04-25 DIAGNOSIS — I10 Essential (primary) hypertension: Secondary | ICD-10-CM

## 2021-04-25 DIAGNOSIS — Z87891 Personal history of nicotine dependence: Secondary | ICD-10-CM

## 2021-04-27 ENCOUNTER — Telehealth: Payer: Self-pay

## 2021-04-27 DIAGNOSIS — I4891 Unspecified atrial fibrillation: Secondary | ICD-10-CM

## 2021-04-27 MED ORDER — APIXABAN 5 MG PO TABS: 5.0000 mg | ORAL_TABLET | Freq: Two times a day (BID) | ORAL | 1 refills | Status: DC

## 2021-04-27 NOTE — Telephone Encounter (Signed)
Patient requesting refill for eliquis. Will send to our coumadin clinic. ?

## 2021-04-27 NOTE — Telephone Encounter (Signed)
Prescription refill request for Eliquis received. ?Indication:Afib  ?Last office visit:04/18/21 Johnsie Cancel)  ?Scr: 0.93 (02/13/21 via PCP) ?Age: 76 ?Weight: 107.5kg ? ?Appropriate dose and refill sent to requested pharmacy.  ?

## 2021-05-17 ENCOUNTER — Ambulatory Visit: Payer: Medicare Other | Admitting: Podiatry

## 2021-05-17 ENCOUNTER — Encounter: Payer: Self-pay | Admitting: Podiatry

## 2021-05-17 DIAGNOSIS — N183 Chronic kidney disease, stage 3 unspecified: Secondary | ICD-10-CM

## 2021-05-17 DIAGNOSIS — E1122 Type 2 diabetes mellitus with diabetic chronic kidney disease: Secondary | ICD-10-CM

## 2021-05-17 DIAGNOSIS — B351 Tinea unguium: Secondary | ICD-10-CM

## 2021-05-17 DIAGNOSIS — M79609 Pain in unspecified limb: Secondary | ICD-10-CM | POA: Diagnosis not present

## 2021-05-27 NOTE — Progress Notes (Signed)
?  Subjective:  ?Patient ID: Gregg Flores, male    DOB: 1946/01/17,  MRN: [id number] ? ?Gregg Flores presents to clinic today for at risk foot care. Pt has h/o NIDDM with chronic kidney disease and painful elongated mycotic toenails 1-5 bilaterally which are tender when wearing enclosed shoe gear. Pain is relieved with periodic professional debridement. ? ?Patient unaware of his last A1c. Patient did not check blood glucose today. ? ?New problem(s): None.  ? ?PCP is [name], MD , and last visit was May 15, 2021. ? ?Allergies  ?Allergen Reactions  ? Shrimp [Shellfish Allergy] Other (See Comments)  ?  Stomach issues  ? ? ?Review of Systems: Negative except as noted in the HPI. ? ?Objective: No changes noted in today's physical examination. ? ?There were no vitals filed for this visit. ? ?Gregg Flores is a pleasant 76 y.o. male in NAD. AAO X 3. ? ?Vascular Examination: ?CFT <3 seconds b/l LE. Palpable DP/PT pulses b/l LE. Digital hair absent b/l. Skin temperature gradient WNL b/l. No pain with calf compression b/l. Trace edema noted b/l. No cyanosis or clubbing noted b/l LE. ? ?Dermatological Examination: ?Pedal skin is warm and supple b/l LE. No open wounds b/l LE. No interdigital macerations noted b/l LE. Toenails 1-5 b/l elongated, discolored, dystrophic, thickened, crumbly with subungual debris and tenderness to dorsal palpation. Incurvated nailplate medial border(s) bilateral great toes.  Nail border hypertrophy minimal. There is tenderness to palpation. Sign(s) of infection: no clinical signs of infection noted on examination today. ? ?Musculoskeletal Examination: ?Normal muscle strength 5/5 to all lower extremity muscle groups bilaterally. HAV with bunion deformity noted b/l LE. Hammertoe(s) noted to the bilateral 2nd toes.. No pain, crepitus or joint limitation noted with ROM b/l LE.  Patient ambulates independently without assistive aids. ? ?Neurological Examination: ?Protective  sensation intact 5/5 intact bilaterally with 10g monofilament b/l. Vibratory sensation diminished b/l. ? ?Assessment/Plan: ?1. Pain due to onychomycosis of nail   ?2. Type 2 diabetes mellitus with stage 3 chronic kidney disease, without long-term current use of insulin, unspecified whether stage 3a or 3b CKD ([place])   ?-Patient was evaluated and treated. All patient's and/or POA's questions/concerns answered on today's visit. ?-Toenails 2-5 bilaterally debrided in length and girth without iatrogenic bleeding with sterile nail nipper and dremel.  ?-Offending nail border debrided and curretaged bilateral great toes utilizing sterile nail nipper and currette. Border cleansed with alcohol and triple antibiotic applied. No further treatment required by patient/caregiver. ?-Patient/POA to call should there be question/concern in the interim.  ? ?Return in about 3 months (around 08/16/2021). ? ?Marzetta Board, DPM  ?

## 2021-07-27 ENCOUNTER — Other Ambulatory Visit: Payer: Self-pay | Admitting: Cardiovascular Disease

## 2021-07-27 DIAGNOSIS — I4891 Unspecified atrial fibrillation: Secondary | ICD-10-CM

## 2021-07-27 MED ORDER — APIXABAN 5 MG PO TABS: 5.0000 mg | ORAL_TABLET | Freq: Two times a day (BID) | ORAL | 1 refills | Status: DC

## 2021-07-27 NOTE — Telephone Encounter (Signed)
Prescription refill request for Eliquis received. Indication: Afib  Last office visit: 04/18/21 Johnsie Cancel)  Scr: 0.93 (02/14/21) Age: 76 Weight: 107.5kg  Appropriate dose and refill sent to requested pharmacy.

## 2021-07-27 NOTE — Telephone Encounter (Signed)
*  STAT* If patient is at the pharmacy, call can be transferred to refill team.   1. Which medications need to be refilled? (please list name of each medication and dose if known) apixaban (ELIQUIS) 5 MG TABS tablet  2. Which pharmacy/location (including street and city if local pharmacy) is medication to be sent to? [facility] [phone] - [place], [place] - [address] AT [place]  3. Do they need a 30 day or 90 day supply? 90   Only has enough for today and tomorrow.

## 2021-08-21 ENCOUNTER — Ambulatory Visit: Payer: Medicare Other | Admitting: Podiatry

## 2021-08-21 ENCOUNTER — Encounter: Payer: Self-pay | Admitting: Podiatry

## 2021-08-21 DIAGNOSIS — Z720 Tobacco use: Secondary | ICD-10-CM

## 2021-08-21 DIAGNOSIS — E785 Hyperlipidemia, unspecified: Secondary | ICD-10-CM

## 2021-08-21 DIAGNOSIS — M79675 Pain in left toe(s): Secondary | ICD-10-CM

## 2021-08-21 DIAGNOSIS — I10 Essential (primary) hypertension: Secondary | ICD-10-CM

## 2021-08-21 DIAGNOSIS — B351 Tinea unguium: Secondary | ICD-10-CM

## 2021-08-21 DIAGNOSIS — E1122 Type 2 diabetes mellitus with diabetic chronic kidney disease: Secondary | ICD-10-CM | POA: Diagnosis not present

## 2021-08-21 DIAGNOSIS — Q828 Other specified congenital malformations of skin: Secondary | ICD-10-CM | POA: Diagnosis not present

## 2021-08-21 DIAGNOSIS — M79674 Pain in right toe(s): Secondary | ICD-10-CM | POA: Diagnosis not present

## 2021-08-21 DIAGNOSIS — N183 Chronic kidney disease, stage 3 unspecified: Secondary | ICD-10-CM

## 2021-08-21 NOTE — Progress Notes (Signed)
  Subjective:  Patient ID: Gregg Flores, male    DOB: May 24, 1945,  MRN: [id number]  TAELON BENDORF presents to clinic today for at risk foot care. Pt has h/o NIDDM with chronic kidney disease and painful thick toenails that are difficult to trim. Pain interferes with ambulation. Aggravating factors include wearing enclosed shoe gear. Pain is relieved with periodic professional debridement.  Last A1c was unknown. Patient does not monitor blood glucose daily.  New problem(s): None.   PCP is [name], MD , and last visit was  August 15, 2021  Allergies  Allergen Reactions   Shrimp [Shellfish Allergy] Other (See Comments)    Stomach issues    Review of Systems: Negative except as noted in the HPI.  Objective: No changes noted in today's physical examination. There were no vitals filed for this visit.  Gregg Flores is a pleasant 76 y.o. male in NAD. AAO X 3.  Vascular Examination: CFT <3 seconds b/l LE. Palpable DP/PT pulses b/l LE. Digital hair absent b/l. Skin temperature gradient WNL b/l. No pain with calf compression b/l. Trace edema noted b/l. No cyanosis or clubbing noted b/l LE.  Dermatological Examination: Pedal skin is warm and supple b/l LE. No open wounds b/l LE. No interdigital macerations noted b/l LE. Toenails 1-5 b/l elongated, discolored, dystrophic, thickened, crumbly with subungual debris and tenderness to dorsal palpation. Incurvated nailplate medial border(s) bilateral great toes.  Nail border hypertrophy minimal. There is tenderness to palpation. Sign(s) of infection: no clinical signs of infection noted on examination today. Porokeratosis plantar aspect right heel pad.  Musculoskeletal Examination: Normal muscle strength 5/5 to all lower extremity muscle groups bilaterally. HAV with bunion deformity noted b/l LE. Hammertoe(s) noted to the bilateral 2nd toes.. No pain, crepitus or joint limitation noted with ROM b/l LE.  Patient ambulates independently  without assistive aids.  Neurological Examination: Protective sensation intact 5/5 intact bilaterally with 10g monofilament b/l. Vibratory sensation diminished b/l.  Assessment/Plan: 1. Pain due to onychomycosis of nail   2. Porokeratosis   3. Essential hypertension, benign   4. Hyperlipidemia LDL goal <70   5. Tobacco use   6. Type 2 diabetes mellitus with stage 3 chronic kidney disease, without long-term current use of insulin, unspecified whether stage 3a or 3b CKD ([place])      -Patient was evaluated and treated. All patient's and/or POA's questions/concerns answered on today's visit. -Patient flagged in [place] for Limb at Risk. Ordered noninvasive arterial studies ABIs with and without TBIs for b/l lower extremities. -Continue foot and shoe inspections daily. Monitor blood glucose per PCP/Endocrinologist's recommendations. -Patient to continue soft, supportive shoe gear daily. -Toenails 2-5 bilaterally debrided in length and girth without iatrogenic bleeding with sterile nail nipper and dremel.  -Offending nail border debrided and curretaged bilateral great toes utilizing sterile nail nipper and currette. Border cleansed with alcohol and triple antibiotic applied. No further treatment required by patient/caregiver. Call office if there are any concerns. -Porokeratotic lesion(s) plantar heel pad of right foot pared and enucleated with sterile scalpel blade without incident. Total number of lesions debrided=1. -Patient/POA to call should there be question/concern in the interim.   Return in about 3 months (around 11/21/2021).  Marzetta Board, DPM

## 2021-08-31 ENCOUNTER — Ambulatory Visit (HOSPITAL_COMMUNITY)
Admission: RE | Admit: 2021-08-31 | Discharge: 2021-08-31 | Disposition: A | Discharge status: Home / Self Care | Payer: Medicare Other | Source: Ambulatory Visit | Attending: Cardiovascular Disease | Admitting: Cardiovascular Disease

## 2021-08-31 DIAGNOSIS — I10 Essential (primary) hypertension: Secondary | ICD-10-CM

## 2021-08-31 DIAGNOSIS — M79605 Pain in left leg: Secondary | ICD-10-CM

## 2021-08-31 DIAGNOSIS — M79604 Pain in right leg: Secondary | ICD-10-CM | POA: Diagnosis not present

## 2021-08-31 DIAGNOSIS — E785 Hyperlipidemia, unspecified: Secondary | ICD-10-CM | POA: Diagnosis not present

## 2021-08-31 DIAGNOSIS — Z720 Tobacco use: Secondary | ICD-10-CM | POA: Diagnosis present

## 2021-12-11 ENCOUNTER — Encounter: Payer: Self-pay | Admitting: Podiatry

## 2021-12-11 ENCOUNTER — Ambulatory Visit: Payer: Medicare Other | Admitting: Podiatry

## 2021-12-11 DIAGNOSIS — M79609 Pain in unspecified limb: Secondary | ICD-10-CM

## 2021-12-11 DIAGNOSIS — N183 Chronic kidney disease, stage 3 unspecified: Secondary | ICD-10-CM | POA: Diagnosis not present

## 2021-12-11 DIAGNOSIS — B351 Tinea unguium: Secondary | ICD-10-CM

## 2021-12-11 DIAGNOSIS — Q828 Other specified congenital malformations of skin: Secondary | ICD-10-CM | POA: Diagnosis not present

## 2021-12-11 DIAGNOSIS — E1122 Type 2 diabetes mellitus with diabetic chronic kidney disease: Secondary | ICD-10-CM | POA: Diagnosis not present

## 2021-12-11 NOTE — Progress Notes (Signed)
  Subjective:  Patient ID: Gregg Flores, male    DOB: 10/02/1945,  MRN: [id number]  Gregg Flores presents to clinic today for at risk foot care. Pt has h/o NIDDM with chronic kidney disease and painful porokeratotic lesion(s) b/l lower extremities and painful mycotic toenails that limit ambulation. Painful toenails interfere with ambulation. Aggravating factors include wearing enclosed shoe gear. Pain is relieved with periodic professional debridement. Painful porokeratotic lesions are aggravated when weightbearing with and without shoegear. Pain is relieved with periodic professional debridement.  Chief Complaint  Patient presents with   Nail Problem    Diabetic foot care BS-do not check daily A1C-Do not know PCP-Karen Ritcher PCP VST-3 weeks ago   New problem(s): None.   PCP is [name], MD.  Allergies  Allergen Reactions   Shrimp [Shellfish Allergy] Other (See Comments)    Stomach issues    Review of Systems: Negative except as noted in the HPI.  Objective:   Gregg Flores is a pleasant 76 y.o. male obese in NAD. AAO x 3.  Vascular Examination: CFT <3 seconds b/l LE. Palpable DP/PT pulses b/l LE. Digital hair absent b/l. Skin temperature gradient WNL b/l. No pain with calf compression b/l. Trace edema noted b/l. No cyanosis or clubbing noted b/l LE.  Dermatological Examination: Pedal skin is warm and supple b/l LE. No open wounds b/l LE. No interdigital macerations noted b/l LE.   Toenails 1-5 b/l elongated, discolored, dystrophic, thickened, crumbly with subungual debris and tenderness to dorsal palpation.   Porokeratosis submet head 1 left foot and submet head 5 right foot. No erythema, no edema, no drainage, no fluctuance.  Musculoskeletal Examination: Normal muscle strength 5/5 to all lower extremity muscle groups bilaterally. HAV with bunion deformity noted b/l LE. Hammertoe(s) noted to the bilateral 2nd toes.. No pain, crepitus or joint limitation  noted with ROM b/l LE.  Patient ambulates independently without assistive aids.  Neurological Examination: Protective sensation intact 5/5 intact bilaterally with 10g monofilament b/l. Vibratory sensation diminished b/l.  Assessment/Plan: 1. Pain due to onychomycosis of nail   2. Porokeratosis   3. Type 2 diabetes mellitus with stage 3 chronic kidney disease, without long-term current use of insulin, unspecified whether stage 3a or 3b CKD ([place])     No orders of the defined types were placed in this encounter.   -Consent given for treatment as described below: -Examined patient. -Continue diabetic foot care principles: inspect feet daily, monitor glucose as recommended by PCP and/or Endocrinologist, and follow prescribed diet per PCP, Endocrinologist and/or dietician. -Mycotic toenails 1-5 bilaterally were debrided in length and girth with sterile nail nippers and dremel without iatrogenic bleeding. -Porokeratotic lesion(s) submet head 1 left foot and submet head 5 right foot pared and enucleated with sterile currette without incident. Total number of lesions debrided=2. -Patient/POA to call should there be question/concern in the interim.   Return in about 3 months (around 03/13/2022).  Marzetta Board, DPM

## 2022-04-03 ENCOUNTER — Ambulatory Visit: Payer: Medicare Other | Admitting: Podiatry

## 2022-04-03 VITALS — BP 142/98

## 2022-04-03 DIAGNOSIS — B351 Tinea unguium: Secondary | ICD-10-CM | POA: Diagnosis not present

## 2022-04-03 DIAGNOSIS — M79609 Pain in unspecified limb: Secondary | ICD-10-CM | POA: Diagnosis not present

## 2022-04-03 DIAGNOSIS — M2041 Other hammer toe(s) (acquired), right foot: Secondary | ICD-10-CM

## 2022-04-03 DIAGNOSIS — E1122 Type 2 diabetes mellitus with diabetic chronic kidney disease: Secondary | ICD-10-CM | POA: Diagnosis not present

## 2022-04-03 DIAGNOSIS — E119 Type 2 diabetes mellitus without complications: Secondary | ICD-10-CM | POA: Diagnosis not present

## 2022-04-03 DIAGNOSIS — M2011 Hallux valgus (acquired), right foot: Secondary | ICD-10-CM

## 2022-04-03 DIAGNOSIS — M2012 Hallux valgus (acquired), left foot: Secondary | ICD-10-CM

## 2022-04-03 DIAGNOSIS — Q828 Other specified congenital malformations of skin: Secondary | ICD-10-CM

## 2022-04-03 DIAGNOSIS — M2042 Other hammer toe(s) (acquired), left foot: Secondary | ICD-10-CM

## 2022-04-03 DIAGNOSIS — N183 Chronic kidney disease, stage 3 unspecified: Secondary | ICD-10-CM

## 2022-04-03 NOTE — Progress Notes (Unsigned)
ANNUAL DIABETIC FOOT EXAM  Subjective: Gregg Flores presents today for annual diabetic foot examination.  Chief Complaint  Patient presents with   Nail Problem    [facility] BS-do not check A1C-'Under 7" PCP-[facility] PCP VST- about 2 months ago     Patient confirms h/o diabetes.  Patient relates 20 year h/o diabetes.  Patient denies any h/o foot wounds.  Patient has h/o foot ulcer of {jgPodToeLocator:23637}, which healed via help of ***.  Patient has h/o amputation(s): {jgamp:23617}.  Patient endorses symptoms of foot numbness.   Patient endorses symptoms of foot tingling.  Patient endorses symptoms of burning in feet.  Patient endorses symptoms of pins/needles sensation in feet.  Patient denies any numbness, tingling, burning, or pins/needle sensation in feet.  Patient has been diagnosed with neuropathy and it is managed with {JGNEUROPATHYMEDS:27053}.  Risk factors: {jgriskfactors:24044}.  [name], MD is patient's PCP. Last visit was {Time; dates multiple:15870}***.  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/06/2017   BMI 35.0-35.9,adult 05/18/2014   History of hepatitis C 06/15/2012   Hyperlipidemia LDL goal <70 04/06/2012   Tobacco use 09/25/2011   Essential hypertension, benign    Chronic low back pain    Type 2 diabetes mellitus with renal manifestations ([place])    Diverticulosis    Benign prostatic hyperplasia with urinary obstruction    Monoclonal gammopathy    Cataracts, bilateral    Erectile dysfunction due to  arterial insufficiency    Microalbuminuria    Benign neoplasm of colon 07/01/2008   Past Surgical History:  Procedure Laterality Date   CARDIOVERSION N/A 12/08/2015   Procedure: CARDIOVERSION;  Surgeon: Larey Dresser, MD;  Location: [facility];  Service: Cardiovascular;  Laterality: N/A;   TEE WITHOUT CARDIOVERSION N/A 12/08/2015   Procedure: TRANSESOPHAGEAL ECHOCARDIOGRAM (TEE);  Surgeon: Larey Dresser, MD;  Location: [facility];  Service: Cardiovascular;  Laterality: N/A;   TRANSURETHRAL RESECTION OF PROSTATE     Current Outpatient Medications on File Prior to Visit  Medication Sig Dispense Refill   BREZTRI AEROSPHERE 160-9-4.8 MCG/ACT AERO SMARTSIG:2 Puff(s) By Mouth Twice Daily     gabapentin (NEURONTIN) 300 MG capsule Take 300 mg by mouth at bedtime as needed.     allopurinol (ZYLOPRIM) 100 MG tablet Take 100 mg by mouth daily.     amLODipine (NORVASC) 10 MG tablet take 1 tablet by mouth once daily 90 tablet 1   apixaban (ELIQUIS) 5 MG TABS tablet Take 1 tablet (5 mg total) by mouth 2 (two) times daily. 180 tablet 1   atorvastatin (LIPITOR) 40 MG tablet Take 40 mg by mouth daily.     benazepril (LOTENSIN) 40 MG tablet take 1 tablet by mouth once daily 90 tablet 1   cetirizine (ZYRTEC) 10 MG tablet Take 10 mg by mouth daily.     finasteride (PROSCAR) 5 MG tablet Take 5 mg by mouth daily.     fluticasone (FLONASE) 50 MCG/ACT nasal spray Place 2 sprays into both nostrils daily.      hydrochlorothiazide (  HYDRODIURIL) 25 MG tablet Take 1 tablet (25 mg total) by mouth every morning. 90 tablet 3   ipratropium (ATROVENT) 0.03 % nasal spray Place 2 sprays into the nose every 12 (twelve) hours. 90 mL 3   metFORMIN (GLUCOPHAGE) 1000 MG tablet take 1 tablet by mouth twice a day with food 180 tablet 1   metoprolol tartrate (LOPRESSOR) 50 MG tablet take 1 tablet by mouth twice a day 180 tablet 1   mupirocin ointment (BACTROBAN) 2 % Apply to affected toe once daily for one week. 30 g 1    sitaGLIPtin (JANUVIA) 50 MG tablet Take 50 mg by mouth daily.     tamsulosin (FLOMAX) 0.4 MG CAPS capsule Take 0.4 mg by mouth every 12 (twelve) hours. (Patient not taking: Reported on 04/18/2021)     varenicline (CHANTIX PAK) 0.5 MG X 11 & 1 MG X 42 tablet Take by mouth.     Vitamin D, Ergocalciferol, (DRISDOL) 1.25 MG (50000 UNIT) CAPS capsule Take 50,000 Units by mouth once a week.     No current facility-administered medications on file prior to visit.    Allergies  Allergen Reactions   Shrimp [Shellfish Allergy] Other (See Comments)    Stomach issues   Social History   Occupational History   Occupation: retired-Paint Librarian, academic    Comment: [employer]  Tobacco Use   Smoking status: Every Day    Packs/day: 0.50    Years: 43.00    Total pack years: 21.50    Types: Cigarettes   Smokeless tobacco: Never   Tobacco comments:    has cut back to 0.5 ppd (09/27/15) using patches  Vaping Use   Vaping Use: Never used  Substance and Sexual Activity   Alcohol use: No    Alcohol/week: 0.0 - 1.0 standard drinks of alcohol    Comment: occasionally, wine and liquour    Drug use: No   Sexual activity: Yes    Birth control/protection: Condom   Family History  Problem Relation Age of Onset   Heart disease Mother        s/p CABG   Hypertension Mother    Diabetes Brother    Colon cancer Neg Hx    Immunization History  Administered Date(s) Administered   Hepatitis A 07/15/2012   Hepatitis A, Adult 06/30/2013   Hepatitis B 07/15/2012   Hepatitis B, ADULT 08/26/2012, 01/26/2013   Influenza Split 09/21/2015, 10/16/2016   Influenza-Unspecified 10/30/2011, 10/02/2013, 10/15/2014   Pneumococcal Conjugate-13 06/30/2013   Pneumococcal Polysaccharide-23 11/22/2004, 09/07/2010   Tdap 12/06/2009   Zoster, Live 08/08/2013     Review of Systems: Negative except as noted in the HPI.   Objective: Vitals:   04/03/22 0816  BP: (!) 142/98    BOY FABRY is a pleasant 77 y.o.  male in NAD. AAO X 3.  Vascular Examination: {jgvascular:23595}  Dermatological Examination: {jgderm:23598}  Neurological Examination: {jgneuro:23601::"Protective sensation intact 5/5 intact bilaterally with 10g monofilament b/l.","Vibratory sensation intact b/l.","Proprioception intact bilaterally."}  Musculoskeletal Examination: {jgmsk:23600}  Footwear Assessment: Does the patient wear appropriate shoes? {Yes,No}. Does the patient need inserts/orthotics? {Yes,No}.  Lab Results  Component Value Date   HGBA1C 7.0 (H) 02/05/2017   No results found. ADA Risk Categorization: Low Risk :  Patient has all of the following: Intact protective sensation No prior foot ulcer  No severe deformity Pedal pulses present  High Risk  Patient has one or more of the following: Loss of protective sensation Absent pedal pulses Severe Foot deformity History of  foot ulcer  Assessment: 1. Pain due to onychomycosis of nail   2. Hallux valgus, acquired, bilateral   3. Acquired hammertoes of both feet   4. Type 2 diabetes mellitus with stage 3 chronic kidney disease, without long-term current use of insulin, unspecified whether stage 3a or 3b CKD ([place])   5. Encounter for diabetic foot exam ([place])      Plan: No orders of the defined types were placed in this encounter.   No orders of the defined types were placed in this encounter.   None  {jgplan:23602::"-Patient/POA to call should there be question/concern in the interim."} No follow-ups on file.  Marzetta Board, DPM

## 2022-04-04 ENCOUNTER — Encounter: Payer: Self-pay | Admitting: Podiatry

## 2022-07-24 NOTE — Progress Notes (Signed)
CARDIOLOGY OFFICE NOTE  Date:  08/03/2022    Gregg Flores Date of Birth: 12-Apr-1945 Medical Record #[id number]  PCP:  Dois Davenport, MD  Cardiologist:  Townsend Roger    No chief complaint on file.   History of Present Illness: Gregg Flores is a 77 y.o. male who presents today for f/u     He has a history of HTN, PAF, HLD and a dilated aortic root. He has had prior TEE/DCC back in 2017. CHADSVASC of at least 3. Stomach upset with Xarelto and turned down for patient assistance with Eliquis but remains on Eliquis.    CTA 02/10/20 with 4.3 cm ascending thoracic aorta  Stable on non constrast lung cancer CT 4.3 cm 04/26/21   [employer] fan and disappointed again this year   Still smoking no motivation to quit  No cardiac issues Primary checks lhis blood work 3-4 times/year No bleeding issues on DOAC   Past Medical History:  Diagnosis Date   BPH (benign prostatic hyperplasia)    Cataracts, bilateral    Chronic LBP    Colon polyps    Diverticulosis    ED (erectile dysfunction)    Essential hypertension, benign 1990   Microalbuminuria    Monoclonal gammopathy 04/2008   Type II or unspecified type diabetes mellitus without mention of complication, not stated as uncontrolled     Past Surgical History:  Procedure Laterality Date   CARDIOVERSION N/A 12/08/2015   Procedure: CARDIOVERSION;  Surgeon: Laurey Morale, MD;  Location: [facility] ENDOSCOPY;  Service: Cardiovascular;  Laterality: N/A;   TEE WITHOUT CARDIOVERSION N/A 12/08/2015   Procedure: TRANSESOPHAGEAL ECHOCARDIOGRAM (TEE);  Surgeon: Laurey Morale, MD;  Location: [facility] ENDOSCOPY;  Service: Cardiovascular;  Laterality: N/A;   TRANSURETHRAL RESECTION OF PROSTATE       Medications: Current Meds  Medication Sig   allopurinol (ZYLOPRIM) 100 MG tablet Take 100 mg by mouth daily.   amLODipine (NORVASC) 10 MG tablet take 1 tablet by mouth once daily   apixaban (ELIQUIS) 5 MG TABS tablet Take 1 tablet (5 mg total)  by mouth 2 (two) times daily.   atorvastatin (LIPITOR) 40 MG tablet Take 40 mg by mouth daily.   benazepril (LOTENSIN) 40 MG tablet take 1 tablet by mouth once daily   BREZTRI AEROSPHERE 160-9-4.8 MCG/ACT AERO SMARTSIG:2 Puff(s) By Mouth Twice Daily   cetirizine (ZYRTEC) 10 MG tablet Take 10 mg by mouth daily.   finasteride (PROSCAR) 5 MG tablet Take 5 mg by mouth daily.   fluticasone (FLONASE) 50 MCG/ACT nasal spray Place 2 sprays into both nostrils daily.    gabapentin (NEURONTIN) 300 MG capsule Take 300 mg by mouth at bedtime as needed.   hydrochlorothiazide (HYDRODIURIL) 25 MG tablet Take 1 tablet (25 mg total) by mouth every morning.   ipratropium (ATROVENT) 0.03 % nasal spray Place 2 sprays into the nose every 12 (twelve) hours.   metFORMIN (GLUCOPHAGE) 1000 MG tablet take 1 tablet by mouth twice a day with food   metoprolol tartrate (LOPRESSOR) 50 MG tablet take 1 tablet by mouth twice a day   mupirocin ointment (BACTROBAN) 2 % Apply to affected toe once daily for one week.   sitaGLIPtin (JANUVIA) 50 MG tablet Take 50 mg by mouth daily.   tamsulosin (FLOMAX) 0.4 MG CAPS capsule Take 0.4 mg by mouth every 12 (twelve) hours.   varenicline (CHANTIX PAK) 0.5 MG X 11 & 1 MG X 42 tablet Take by mouth.   Vitamin  D, Ergocalciferol, (DRISDOL) 1.25 MG (50000 UNIT) CAPS capsule Take 50,000 Units by mouth once a week.     Allergies: Allergies  Allergen Reactions   Shrimp [Shellfish Allergy] Other (See Comments)    Stomach issues    Social History: The patient  reports that he has been smoking cigarettes. He has a 21.50 pack-year smoking history. He has never used smokeless tobacco. He reports that he does not drink alcohol and does not use drugs.   Family History: The patient's family history includes Diabetes in his brother; Heart disease in his mother; Hypertension in his mother.   Review of Systems: Please see the history of present illness.   All other systems are reviewed and  negative.   Physical Exam: VS:  BP 124/68   Ht 5\' 11"  (1.803 m)   Wt 241 lb (109.3 kg)   BMI 33.61 kg/m  .  BMI Body mass index is 33.61 kg/m.  Wt Readings from Last 3 Encounters:  08/03/22 241 lb (109.3 kg)  04/18/21 237 lb (107.5 kg)  02/24/20 240 lb (108.9 kg)   Affect appropriate Healthy:  appears stated age HEENT: normal Neck supple with no adenopathy JVP normal no bruits no thyromegaly Lungs clear with no wheezing and good diaphragmatic motion Heart:  S1/S2 no murmur, no rub, gallop or click PMI normal Abdomen: benighn, BS positve, no tenderness, no AAA no bruit.  No HSM or HJR Distal pulses intact with no bruits No edema Neuro non-focal Skin warm and dry No muscular weakness    LABORATORY DATA:  EKG:  SR rate 60 LAD poor R wave progression PAC   Lab Results  Component Value Date   WBC 5.9 02/11/2019   HGB 13.8 02/11/2019   HCT 43.6 02/11/2019   PLT 269 02/11/2019   GLUCOSE 164 (H) 02/04/2020   CHOL 105 02/11/2019   TRIG 126 02/11/2019   HDL 29 (L) 02/11/2019   LDLCALC 53 02/11/2019   ALT 13 02/11/2019   AST 13 02/11/2019   NA 140 02/04/2020   K 4.2 02/04/2020   CL 104 02/04/2020   CREATININE 0.89 02/04/2020   BUN 14 02/04/2020   CO2 19 (L) 02/04/2020   TSH 1.450 08/25/2018   INR 1.1 11/24/2015   HGBA1C 7.0 (H) 02/05/2017   MICROALBUR 7.4 11/16/2014    Other Studies Reviewed Today:  CTA CHEST IMPRESSION:  02/10/20  4.3 cm    IMPRESSION: 1. Grossly stable 4.3 cm ascending thoracic aortic aneurysm. No dissection is noted. Recommend annual imaging followup by CTA or MRA. This recommendation follows 2010 ACCF/AHA/AATS/ACR/ASA/SCA/SCAI/SIR/STS/SVM Guidelines for the Diagnosis and Management of Patients with Thoracic Aortic Disease. Circulation. 2010; 121: Z610-R604. Aortic aneurysm NOS (ICD10-I71.9). 2. Moderate to severe coronary artery calcifications are noted suggesting coronary artery disease. 3. Emphysema and aortic atherosclerosis.    Aortic Atherosclerosis (ICD10-I70.0) and Emphysema (ICD10-J43.9).     Electronically Signed   By: Lupita Raider M.D.   On: 02/10/2020 09:14   Procedure: TEE 2017 Findings: Please see echo section for full report.  The patient was in rapid atrial fibrillation.  Normal LV size with moderate LV hypertrophy.  EF 45%, diffuse hypokinesis.  Normal RV size and systolic function.  Mild left atrial enlargement, no LA appendage thrombus.  Normal right atrium.  Trileaflet aortic valve with no stenosis or regurgitation.  Trivial MR.  There appeared to be a loose chord attached to the anterior mitral leaflet.  However, there was only mild mitral regurgitation.  No PFO/ASD, negative bubble  study.  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       May proceed to DCCV.  Would repeat echo in sinus rhythm to confirm EF.    Marca Ancona 12/08/2015     ECHO 08/09/15 Study Conclusions   - Left ventricle: The cavity size was normal. Systolic function was   normal. The estimated ejection fraction was in the range of 55%   to 60%. Wall motion was normal; there were no regional wall   motion abnormalities. The study was not technically sufficient to   allow evaluation of LV diastolic dysfunction due to atrial   fibrillation. - Aortic valve: Trileaflet; mildly thickened, mildly calcified   leaflets. Moderate focal calcification involving the noncoronary   cusp. - Aorta: Aortic root dimension: 39 mm (ED). Ascending aorta   diameter: 4.39 mm (ED). - Aortic root: The aortic root was mildly dilated. - Ascending aorta: The ascending aorta was mildly dilated. - Mitral valve: MIldly calcified chordae tendinae - Right ventricle: The cavity size was mildly dilated. Wall   thickness was normal.     Procedure: Electrical Cardioversion 2017 Indications:  Atrial Fibrillation   Procedure Details Consent: Risks of procedure as well as the alternatives  and risks of each were explained to the (patient/caregiver).  Consent for procedure obtained. Time Out: Verified patient identification, verified procedure, site/side was marked, verified correct patient position, special equipment/implants available, medications/allergies/relevent history reviewed, required imaging and test results available.  Performed   Patient placed on cardiac monitor, pulse oximetry, supplemental oxygen as necessary.  Sedation given: Propofol per anesthesiology Pacer pads placed anterior and posterior chest.   Cardioverted 1 time(s).  Cardioverted at 200J.   Evaluation Findings: Post procedure EKG shows: NSR Complications: None Patient did tolerate procedure well.     Marca Ancona 12/08/2015, 12:35 PM     Assessment & Plan:   1. PAF - prior cardioversion 11/2015 - remains in sinus  CHADSVASC of at least 4 - stable   2. HTN - Well controlled.  Continue current medications and low sodium Dash type diet.    3. HLD - on statin -  LDL 53   4. DM - Discussed low carb diet.  Target hemoglobin A1c is 6.5 or less.  Continue current medications.  5. Dilated ascending aorta -  4.3 cm by CT 04/25/21 stable  6. Chronic anticoagulation - no bleeding issues    7. Tobacco abuse -  Counseled on cessation < 10 minutes not motivated to quit Cancer CT no lesions 04/26/21        F/U in a year   Signed: Charlton Haws, MD  08/03/2022 8:28 AM

## 2022-08-03 ENCOUNTER — Ambulatory Visit: Payer: Medicare Other | Attending: Cardiovascular Disease | Admitting: Cardiovascular Disease

## 2022-08-03 VITALS — BP 124/68 | Ht 71.0 in | Wt 241.0 lb

## 2022-08-03 DIAGNOSIS — I4891 Unspecified atrial fibrillation: Secondary | ICD-10-CM | POA: Diagnosis not present

## 2022-08-03 MED ORDER — APIXABAN 5 MG PO TABS: 5.0000 mg | ORAL_TABLET | Freq: Two times a day (BID) | ORAL | 3 refills | Status: DC

## 2022-08-03 NOTE — Patient Instructions (Signed)
Medication Instructions:  Your physician recommends that you continue on your current medications as directed. Please refer to the Current Medication list given to you today.  *If you need a refill on your cardiac medications before your next appointment, please call your pharmacy*  Lab Work: If you have labs (blood work) drawn today and your tests are completely normal, you will receive your results only by: MyChart Message (if you have MyChart) OR A paper copy in the mail If you have any lab test that is abnormal or we need to change your treatment, we will call you to review the results.  Testing/Procedures: None ordered today.  Follow-Up: At [facility], you and your health needs are our priority.  As part of our continuing mission to provide you with exceptional heart care, we have created designated Provider Care Teams.  These Care Teams include your primary Cardiologist (physician) and Advanced Practice Providers (APPs -  Physician Assistants and Nurse Practitioners) who all work together to provide you with the care you need, when you need it.  We recommend signing up for the patient portal called "MyChart".  Sign up information is provided on this After Visit Summary.  MyChart is used to connect with patients for Virtual Visits (Telemedicine).  Patients are able to view lab/test results, encounter notes, upcoming appointments, etc.  Non-urgent messages can be sent to your provider as well.   To learn more about what you can do with MyChart, go to [email or web address].    Your next appointment:   1 year(s)  Provider:   Peter Nishan, MD      

## 2022-08-07 ENCOUNTER — Ambulatory Visit (INDEPENDENT_AMBULATORY_CARE_PROVIDER_SITE_OTHER): Payer: Medicare Other | Admitting: Podiatry

## 2022-08-07 ENCOUNTER — Encounter: Payer: Self-pay | Admitting: Podiatry

## 2022-08-07 VITALS — BP 139/83

## 2022-08-07 DIAGNOSIS — N183 Chronic kidney disease, stage 3 unspecified: Secondary | ICD-10-CM | POA: Diagnosis not present

## 2022-08-07 DIAGNOSIS — Q828 Other specified congenital malformations of skin: Secondary | ICD-10-CM

## 2022-08-07 DIAGNOSIS — E1122 Type 2 diabetes mellitus with diabetic chronic kidney disease: Secondary | ICD-10-CM | POA: Diagnosis not present

## 2022-08-07 DIAGNOSIS — L600 Ingrowing nail: Secondary | ICD-10-CM

## 2022-08-07 DIAGNOSIS — M79609 Pain in unspecified limb: Secondary | ICD-10-CM

## 2022-08-07 DIAGNOSIS — B351 Tinea unguium: Secondary | ICD-10-CM

## 2022-08-07 MED ORDER — MUPIROCIN 2 % EX OINT: TOPICAL_OINTMENT | CUTANEOUS | 1 refills | Status: AC

## 2022-08-07 NOTE — Progress Notes (Signed)
  Subjective:  Patient ID: Gregg Flores, male    DOB: 1945/07/13,  MRN: [id number]  Gregg Flores presents to clinic today for: at risk foot care. Pt has h/o NIDDM with chronic kidney disease and painful porokeratotic lesion(s) submet head 5 right foot and painful mycotic toenails that limit ambulation. Painful toenails interfere with ambulation. Aggravating factors include wearing enclosed shoe gear. Pain is relieved with periodic professional debridement. Painful porokeratotic lesions are aggravated when weightbearing with and without shoegear. Pain is relieved with periodic professional debridement. He is requesting refill of his Mupirocin Ointment. Chief Complaint  Patient presents with   Nail Problem    [facility]   PCP is Dois Davenport, MD.  Allergies  Allergen Reactions   Shrimp [Shellfish Allergy] Other (See Comments)    Stomach issues   Review of Systems: Negative except as noted in the HPI.  Objective: No changes noted in today's physical examination. Vitals:   08/07/22 0818  BP: 139/83   Gregg Flores is a pleasant 77 y.o. male in NAD. AAO x 3.  Vascular Examination: Capillary refill time <3 seconds b/l LE. Palpable pedal pulses b/l LE. Digital hair present b/l. No pedal edema b/l. Skin temperature gradient WNL b/l. No varicosities b/l. No cyanosis or clubbing noted b/l LE.Marland Kitchen  Dermatological Examination: Pedal skin with normal turgor, texture and tone b/l. No open wounds. No interdigital macerations b/l. Toenails 1-5 b/l thickened, discolored, dystrophic with subungual debris. There is pain on palpation to dorsal aspect of nailplates.   Incurvated nailplate both borders of left hallux and both borders of right hallux with tenderness to palpation. No erythema, no edema, no drainage noted. No fluctuance.   Resolved porokeratotic lesion(s) submet head 5 right foot. No erythema, no edema, no drainage, no fluctuance..  Neurological Examination: Protective sensation  intact with 10 gram monofilament b/l LE. Vibratory sensation intact b/l LE.   Musculoskeletal Examination: Muscle strength 5/5 to all lower extremity muscle groups bilaterally. Hallux valgus with bunion deformity noted b/l feet. Hammertoe(s) noted to the L 2nd toe and R 2nd toe.  Assessment/Plan: 1. Pain due to onychomycosis of nail   2. Type 2 diabetes mellitus with stage 3 chronic kidney disease, without long-term current use of insulin, unspecified whether stage 3a or 3b CKD (HCC)   -Consent given for treatment as described below: -Examined patient. -Continue foot and shoe inspections daily. Monitor blood glucose per PCP/Endocrinologist's recommendations. -Mycotic toenails 1-5 bilaterally debrided in length and girth with sterile nail nippers without iatrogenic bleeding. Patient declined use of dremel. -No invasive procedure(s) performed. Offending nail border debrided and curretaged bilateral great toes utilizing sterile nail nipper and currette. Border cleansed with alcohol and triple antibiotic ointment. No further treatment required by patient/caregiver. Call office if there are any concerns. Refilled Mupirocin Ointment which he uses as needed. -Patient/POA to call should there be question/concern in the interim.   Return in about 3 months (around 11/07/2022).  Freddie Breech, DPM

## 2022-11-20 ENCOUNTER — Ambulatory Visit (INDEPENDENT_AMBULATORY_CARE_PROVIDER_SITE_OTHER): Payer: Medicare Other | Admitting: Podiatry

## 2022-11-20 DIAGNOSIS — Z91199 Patient's noncompliance with other medical treatment and regimen due to unspecified reason: Secondary | ICD-10-CM

## 2022-11-20 NOTE — Progress Notes (Signed)
1. No-show for appointment     

## 2023-02-06 ENCOUNTER — Ambulatory Visit: Payer: Medicare Other | Admitting: Vascular Surgery

## 2023-02-06 ENCOUNTER — Encounter: Payer: Self-pay | Admitting: Vascular Surgery

## 2023-02-06 VITALS — BP 119/56 | HR 55 | Temp 98.0°F | Resp 20 | Ht 71.0 in | Wt 229.0 lb

## 2023-02-06 DIAGNOSIS — I7143 Infrarenal abdominal aortic aneurysm, without rupture: Secondary | ICD-10-CM

## 2023-02-06 NOTE — Progress Notes (Signed)
 Patient ID: Gregg Flores, male   DOB: 09/27/1945, 78 y.o.   MRN: [id number]  Reason for Consult: New Patient (Initial Visit)   Referred by Devere Lonni Fire*  Subjective:     HPI:  Gregg Flores is a 78 y.o. male has a history of TURP followed by urology.  Most recently was found to have abdominal aortic aneurysm by CT urogram now here for follow-up.  No new back or abdominal pain.  He denies any previous knowledge of having an aneurysm and does not have any personal or family history of aneurysm.  He is originally from [place] without limitation.  Previously was a education administrator currently retired.  He is a current every day smoker.  He is on Eliquis  unsure why and also takes a statin.  Chief complaint today is change in taste since last April.  He is not sure if he began a new medications and denies any illnesses at the time.  Past Medical History:  Diagnosis Date   BPH (benign prostatic hyperplasia)    Cataracts, bilateral    Chronic LBP    Colon polyps    Diverticulosis    ED (erectile dysfunction)    Essential hypertension, benign 1990   Microalbuminuria    Monoclonal gammopathy 04/2008   Type II or unspecified type diabetes mellitus without mention of complication, not stated as uncontrolled    Family History  Problem Relation Age of Onset   Heart disease Mother        s/p CABG   Hypertension Mother    Diabetes Brother    Colon cancer Neg Hx    Past Surgical History:  Procedure Laterality Date   CARDIOVERSION N/A 12/08/2015   Procedure: CARDIOVERSION;  Surgeon: Ezra GORMAN Shuck, MD;  Location: [facility] ENDOSCOPY;  Service: Cardiovascular;  Laterality: N/A;   TEE WITHOUT CARDIOVERSION N/A 12/08/2015   Procedure: TRANSESOPHAGEAL ECHOCARDIOGRAM (TEE);  Surgeon: Ezra GORMAN Shuck, MD;  Location: [facility] ENDOSCOPY;  Service: Cardiovascular;  Laterality: N/A;   TRANSURETHRAL RESECTION OF PROSTATE      Short Social History:  Social History   Tobacco Use   Smoking  status: Every Day    Current packs/day: 0.50    Average packs/day: 0.5 packs/day for 43.0 years (21.5 ttl pk-yrs)    Types: Cigarettes   Smokeless tobacco: Never   Tobacco comments:    has cut back to 0.5 ppd (09/27/15) using patches  Substance Use Topics   Alcohol use: No    Alcohol/week: 0.0 - 1.0 standard drinks of alcohol    Comment: occasionally, wine and liquour     Allergies  Allergen Reactions   Shrimp [Shellfish Allergy] Other (See Comments)    Stomach issues    Current Outpatient Medications  Medication Sig Dispense Refill   allopurinol (ZYLOPRIM) 100 MG tablet Take 100 mg by mouth daily.     amLODipine  (NORVASC ) 10 MG tablet take 1 tablet by mouth once daily 90 tablet 1   apixaban  (ELIQUIS ) 5 MG TABS tablet Take 1 tablet (5 mg total) by mouth 2 (two) times daily. 180 tablet 3   atorvastatin  (LIPITOR) 40 MG tablet Take 40 mg by mouth daily.     benazepril  (LOTENSIN ) 40 MG tablet take 1 tablet by mouth once daily 90 tablet 1   BREZTRI AEROSPHERE 160-9-4.8 MCG/ACT AERO SMARTSIG:2 Puff(s) By Mouth Twice Daily     cetirizine (ZYRTEC) 10 MG tablet Take 10 mg by mouth daily.     finasteride (PROSCAR) 5 MG tablet Take  5 mg by mouth daily.     fluticasone (FLONASE) 50 MCG/ACT nasal spray Place 2 sprays into both nostrils daily.      gabapentin (NEURONTIN) 300 MG capsule Take 300 mg by mouth at bedtime as needed.     hydrochlorothiazide  (HYDRODIURIL ) 25 MG tablet Take 1 tablet (25 mg total) by mouth every morning. 90 tablet 3   ipratropium (ATROVENT ) 0.03 % nasal spray Place 2 sprays into the nose every 12 (twelve) hours. 90 mL 3   metFORMIN  (GLUCOPHAGE ) 1000 MG tablet take 1 tablet by mouth twice a day with food 180 tablet 1   metoprolol  tartrate (LOPRESSOR ) 50 MG tablet take 1 tablet by mouth twice a day 180 tablet 1   mupirocin  ointment (BACTROBAN ) 2 % Apply to affected toe once daily. 30 g 1   sitaGLIPtin  (JANUVIA ) 50 MG tablet Take 50 mg by mouth daily.     tamsulosin  (FLOMAX) 0.4 MG CAPS capsule Take 0.4 mg by mouth every 12 (twelve) hours.     varenicline (CHANTIX PAK) 0.5 MG X 11 & 1 MG X 42 tablet Take by mouth.     Vitamin D, Ergocalciferol, (DRISDOL) 1.25 MG (50000 UNIT) CAPS capsule Take 50,000 Units by mouth once a week.     No current facility-administered medications for this visit.    Review of Systems  Constitutional:  Constitutional negative. HENT:       Change in taste Eyes: Eyes negative.  Respiratory: Respiratory negative.  Cardiovascular: Cardiovascular negative.  GI: Gastrointestinal negative.  Musculoskeletal: Musculoskeletal negative.  Skin: Skin negative.  Neurological: Neurological negative. Hematologic: Hematologic/lymphatic negative.  Psychiatric: Psychiatric negative.        Objective:  Objective  Vitals:   02/06/23 0840  BP: (!) 119/56  Pulse: (!) 55  Resp: 20  Temp: 98 F (36.7 C)  SpO2: 97%     Physical Exam HENT:     Head: Normocephalic.     Nose: Nose normal.  Eyes:     Pupils: Pupils are equal, round, and reactive to light.  Cardiovascular:     Rate and Rhythm: Normal rate.     Pulses:          Femoral pulses are 2+ on the right side and 2+ on the left side.      Popliteal pulses are 2+ on the right side and 2+ on the left side.  Pulmonary:     Effort: Pulmonary effort is normal.  Abdominal:     General: Abdomen is flat.     Palpations: Abdomen is soft. There is no mass.  Musculoskeletal:     Right lower leg: No edema.     Left lower leg: No edema.  Skin:    General: Skin is warm.     Capillary Refill: Capillary refill takes less than 2 seconds.  Neurological:     General: No focal deficit present.     Mental Status: He is alert.  Psychiatric:        Mood and Affect: Mood normal.        Thought Content: Thought content normal.        Judgment: Judgment normal.     Data: CT urogram IMPRESSION: 1. Urinary bladder is unremarkable in noncontrast enhanced CT appearance for degree of  distension. 2. No hydronephrosis. No renal, ureteral or bladder calculi. 3. Similar nodular enlargement along the prostate base extending into the urinary bladder possibly reflecting median lobe hypertrophy. 4. Stable infrarenal abdominal aortic aneurysm measuring 4.2 cm. Recommend  follow-up ultrasound every 12 months and vascular consultation. 5. Aortic Atherosclerosis (ICD10-I70.0).      Assessment/Plan:    78 year old male here for evaluation of 4.2 cm abdominal aortic aneurysm found by CT urogram.  We reviewed a CT scan from 2020 which also demonstrated similar size aneurysm.  We discussed the signs and symptoms of rupture though low risk is not 0 risk and will require emergent medical evaluation.  Sure that I will see him back in 1 year with ultrasound to evaluate his aneurysm.  We discussed the need for CT when the aneurysm is greater than 5 cm in size.     Penne Lonni Colorado MD [facility]

## 2023-02-19 ENCOUNTER — Other Ambulatory Visit: Payer: Self-pay

## 2023-02-19 DIAGNOSIS — I7143 Infrarenal abdominal aortic aneurysm, without rupture: Secondary | ICD-10-CM

## 2023-05-20 ENCOUNTER — Encounter: Payer: Self-pay | Admitting: Family Medicine

## 2023-06-11 ENCOUNTER — Ambulatory Visit (INDEPENDENT_AMBULATORY_CARE_PROVIDER_SITE_OTHER): Payer: Medicare Other | Admitting: Podiatry

## 2023-06-11 ENCOUNTER — Ambulatory Visit: Admitting: Podiatry

## 2023-06-11 ENCOUNTER — Encounter: Payer: Self-pay | Admitting: Podiatry

## 2023-06-11 VITALS — Ht 71.0 in | Wt 229.0 lb

## 2023-06-11 DIAGNOSIS — M2042 Other hammer toe(s) (acquired), left foot: Secondary | ICD-10-CM

## 2023-06-11 DIAGNOSIS — Z91198 Patient's noncompliance with other medical treatment and regimen for other reason: Secondary | ICD-10-CM

## 2023-06-11 DIAGNOSIS — Q828 Other specified congenital malformations of skin: Secondary | ICD-10-CM

## 2023-06-11 DIAGNOSIS — B351 Tinea unguium: Secondary | ICD-10-CM

## 2023-06-11 DIAGNOSIS — E119 Type 2 diabetes mellitus without complications: Secondary | ICD-10-CM

## 2023-06-11 DIAGNOSIS — M2041 Other hammer toe(s) (acquired), right foot: Secondary | ICD-10-CM | POA: Diagnosis not present

## 2023-06-11 DIAGNOSIS — M2012 Hallux valgus (acquired), left foot: Secondary | ICD-10-CM

## 2023-06-11 DIAGNOSIS — E1122 Type 2 diabetes mellitus with diabetic chronic kidney disease: Secondary | ICD-10-CM | POA: Diagnosis not present

## 2023-06-11 DIAGNOSIS — N183 Chronic kidney disease, stage 3 unspecified: Secondary | ICD-10-CM

## 2023-06-11 DIAGNOSIS — M2011 Hallux valgus (acquired), right foot: Secondary | ICD-10-CM

## 2023-06-11 DIAGNOSIS — M79609 Pain in unspecified limb: Secondary | ICD-10-CM | POA: Diagnosis not present

## 2023-06-11 NOTE — Progress Notes (Signed)
 1. Failure to attend appointment with reason given    Rescheduled to another time slot today.

## 2023-06-13 NOTE — Progress Notes (Signed)
 ANNUAL DIABETIC FOOT EXAM  Subjective: Gregg Flores presents today for annual diabetic foot exam.  Chief Complaint  Patient presents with   Nail Problem    Pt is here for [facility] unsure of last A1C PCP is Dr Kurt Phi and LOV was a few months ago.    Patient confirms h/o diabetes.  Patient denies any h/o foot wounds.  Allene Ivan, MD is patient's PCP.  Past Medical History:  Diagnosis Date   BPH (benign prostatic hyperplasia)    Cataracts, bilateral    Chronic LBP    Colon polyps    Diverticulosis    ED (erectile dysfunction)    Essential hypertension, benign 1990   Microalbuminuria    Monoclonal gammopathy 04/2008   Type II or unspecified type diabetes mellitus without mention of complication, not stated as uncontrolled    Patient Active Problem List   Diagnosis Date Noted   Aneurysm of thoracic aorta (HCC) 02/06/2017   BMI 35.0-35.9,adult 05/18/2014   History of hepatitis C 06/15/2012   Hyperlipidemia LDL goal <70 04/06/2012   Tobacco use 09/25/2011   Essential hypertension, benign    Chronic low back pain    Type 2 diabetes mellitus with renal manifestations (HCC)    Diverticulosis    Benign prostatic hyperplasia with urinary obstruction    Monoclonal gammopathy    Cataracts, bilateral    Erectile dysfunction due to arterial insufficiency    Microalbuminuria    Benign neoplasm of colon 07/01/2008   Past Surgical History:  Procedure Laterality Date   CARDIOVERSION N/A 12/08/2015   Procedure: CARDIOVERSION;  Surgeon: Darlis Eisenmenger, MD;  Location: [facility] ENDOSCOPY;  Service: Cardiovascular;  Laterality: N/A;   TEE WITHOUT CARDIOVERSION N/A 12/08/2015   Procedure: TRANSESOPHAGEAL ECHOCARDIOGRAM (TEE);  Surgeon: Darlis Eisenmenger, MD;  Location: [facility] ENDOSCOPY;  Service: Cardiovascular;  Laterality: N/A;   TRANSURETHRAL RESECTION OF PROSTATE     Current Outpatient Medications on File Prior to Visit  Medication Sig Dispense Refill   allopurinol (ZYLOPRIM) 100 MG  tablet Take 100 mg by mouth daily.     amLODipine  (NORVASC ) 10 MG tablet take 1 tablet by mouth once daily 90 tablet 1   apixaban  (ELIQUIS ) 5 MG TABS tablet Take 1 tablet (5 mg total) by mouth 2 (two) times daily. 180 tablet 3   atorvastatin  (LIPITOR) 40 MG tablet Take 40 mg by mouth daily.     benazepril  (LOTENSIN ) 40 MG tablet take 1 tablet by mouth once daily 90 tablet 1   BREZTRI AEROSPHERE 160-9-4.8 MCG/ACT AERO SMARTSIG:2 Puff(s) By Mouth Twice Daily     cetirizine (ZYRTEC) 10 MG tablet Take 10 mg by mouth daily.     finasteride (PROSCAR) 5 MG tablet Take 5 mg by mouth daily.     fluticasone (FLONASE) 50 MCG/ACT nasal spray Place 2 sprays into both nostrils daily.      gabapentin (NEURONTIN) 300 MG capsule Take 300 mg by mouth at bedtime as needed.     hydrochlorothiazide  (HYDRODIURIL ) 25 MG tablet Take 1 tablet (25 mg total) by mouth every morning. 90 tablet 3   ipratropium (ATROVENT ) 0.03 % nasal spray Place 2 sprays into the nose every 12 (twelve) hours. 90 mL 3   metFORMIN  (GLUCOPHAGE ) 1000 MG tablet take 1 tablet by mouth twice a day with food 180 tablet 1   metoprolol  tartrate (LOPRESSOR ) 50 MG tablet take 1 tablet by mouth twice a day 180 tablet 1   mupirocin  ointment (BACTROBAN ) 2 % Apply to affected  toe once daily. 30 g 1   sitaGLIPtin  (JANUVIA ) 50 MG tablet Take 50 mg by mouth daily.     tamsulosin (FLOMAX) 0.4 MG CAPS capsule Take 0.4 mg by mouth every 12 (twelve) hours.     varenicline (CHANTIX PAK) 0.5 MG X 11 & 1 MG X 42 tablet Take by mouth.     Vitamin D, Ergocalciferol, (DRISDOL) 1.25 MG (50000 UNIT) CAPS capsule Take 50,000 Units by mouth once a week.     No current facility-administered medications on file prior to visit.    Allergies  Allergen Reactions   Shrimp [Shellfish Allergy] Other (See Comments)    Stomach issues   Social History   Occupational History   Occupation: retired-Paint Merchandiser, retail    Comment: [employer]  Tobacco Use   Smoking status:  Every Day    Current packs/day: 0.50    Average packs/day: 0.5 packs/day for 43.0 years (21.5 ttl pk-yrs)    Types: Cigarettes   Smokeless tobacco: Never   Tobacco comments:    has cut back to 0.5 ppd (09/27/15) using patches  Vaping Use   Vaping status: Never Used  Substance and Sexual Activity   Alcohol use: No    Alcohol/week: 0.0 - 1.0 standard drinks of alcohol    Comment: occasionally, wine and liquour    Drug use: No   Sexual activity: Yes    Birth control/protection: Condom   Family History  Problem Relation Age of Onset   Heart disease Mother        s/p CABG   Hypertension Mother    Diabetes Brother    Colon cancer Neg Hx    Immunization History  Administered Date(s) Administered   Hepatitis A 07/15/2012   Hepatitis A, Adult 06/30/2013   Hepatitis B 07/15/2012   Hepatitis B, ADULT 08/26/2012, 01/26/2013   Influenza Split 09/21/2015, 10/16/2016   Influenza-Unspecified 10/30/2011, 10/02/2013, 10/15/2014   Pneumococcal Conjugate-13 06/30/2013   Pneumococcal Polysaccharide-23 11/22/2004, 09/07/2010   Tdap 12/06/2009   Zoster, Live 08/08/2013     Review of Systems: Negative except as noted in the HPI.   Objective: There were no vitals filed for this visit.  Gregg Flores is a pleasant 78 y.o. male in NAD. AAO X 3.  Diabetic foot exam was performed with the following findings:   Vascular Examination: CFT <3 seconds b/l. DP/PT pulses faintly palpable b/l. Skin temperature gradient warm to warm b/l. No pain with calf compression. No ischemia or gangrene. No cyanosis or clubbing noted b/l.    Neurological Examination: Sensation grossly intact b/l with 10 gram monofilament. Vibratory sensation intact b/l.   Dermatological Examination: Pedal skin warm and supple b/l.   No open wounds. No interdigital macerations.  Toenails 1-5 b/l well maintained with adequate length. No erythema, no edema, no drainage, no fluctuance. Porokeratotic lesion(s) submet head 5  right foot. No erythema, no edema, no drainage, no fluctuance.  Musculoskeletal Examination: Muscle strength 5/5 to all lower extremity muscle groups bilaterally. HAV with bunion deformity noted b/l LE. Hammertoe(s) bilateral 2nd toes.  Radiographs: None     Lab Results  Component Value Date   HGBA1C 7.0 (H) 02/05/2017    ADA Risk Categorization: Low Risk :  Patient has all of the following: Intact protective sensation No prior foot ulcer  No severe deformity Pedal pulses present  Assessment: 1. Pain due to onychomycosis of nail   2. Porokeratosis   3. Hallux valgus, acquired, bilateral   4. Acquired hammertoes of both  feet   5. Type 2 diabetes mellitus with stage 3 chronic kidney disease, without long-term current use of insulin, unspecified whether stage 3a or 3b CKD (HCC)   6. Encounter for diabetic foot exam (HCC)     Plan: Diabetic foot examination performed today.  All patient's and/or POA's questions/concerns addressed on today's visit. Toenails 1-5 debrided in length and girth without incident. Porokeratotic lesion(s) submet head 5 right foot pared with sharp debridement without incident. Continue daily foot inspections and monitor blood glucose per PCP/Endocrinologist's recommendations. Continue soft, supportive shoe gear daily. Report any pedal injuries to medical professional. Call office if there are any questions/concerns. -Patient/POA to call should there be question/concern in the interim. Return in about 4 months (around 10/12/2023).  Luella Sager, DPM      [place] LOCATION: [address] [place] [address]                   Office [phone]   [facility] LOCATION: [address] [place], [place] [address] Office [phone]

## 2023-08-18 IMAGING — CT CT CHEST LUNG CANCER SCREENING LOW DOSE W/O CM
1 series · 10 of 10 positions shown, 13 images · non-contrast
Comparison: 02/10/2020 CTA chest.  No prior screening CT.

CLINICAL DATA: 53 pack-year smoking history current smoker



[ct lung segmentation data · axial · 0.83mm/px · z∈[-362,-362]mm · 10 of 352 frames shown]
[frame 1/352  mediastinal]
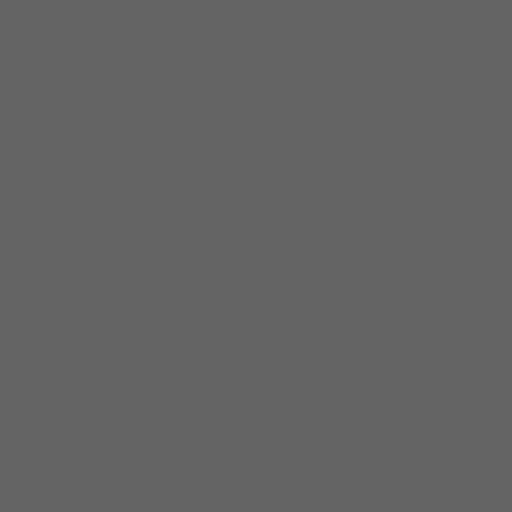
[frame 1/352  lung]
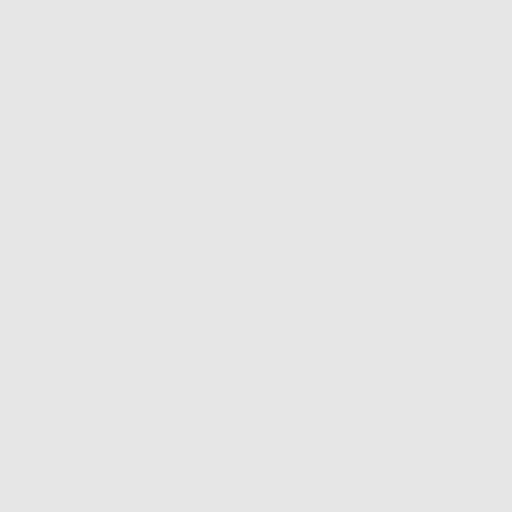
[frame 40/352  lung]
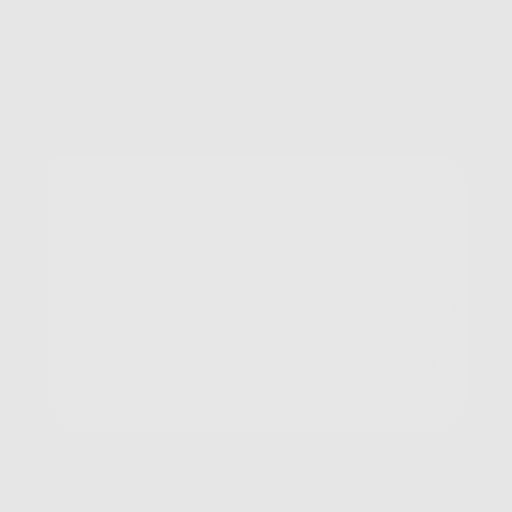
[frame 79/352  lung]
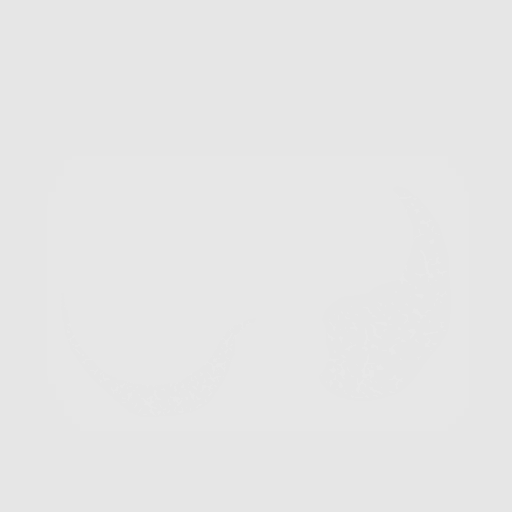
[frame 118/352  lung]
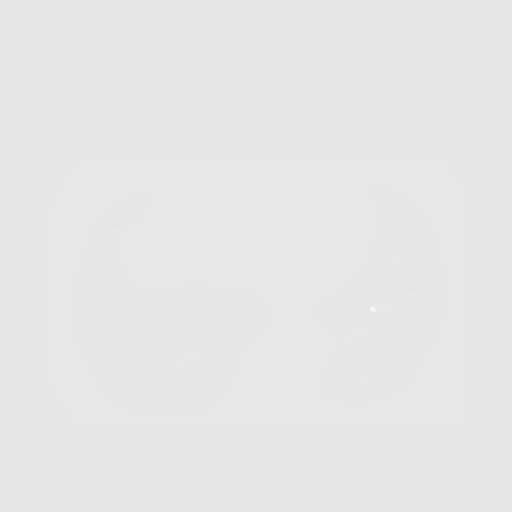
[frame 157/352  mediastinal]
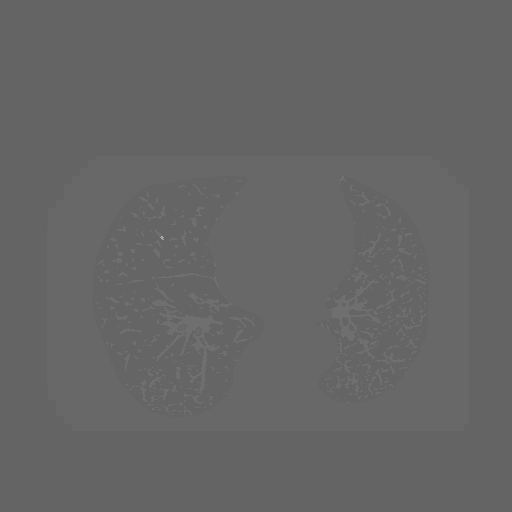
[frame 157/352  lung]
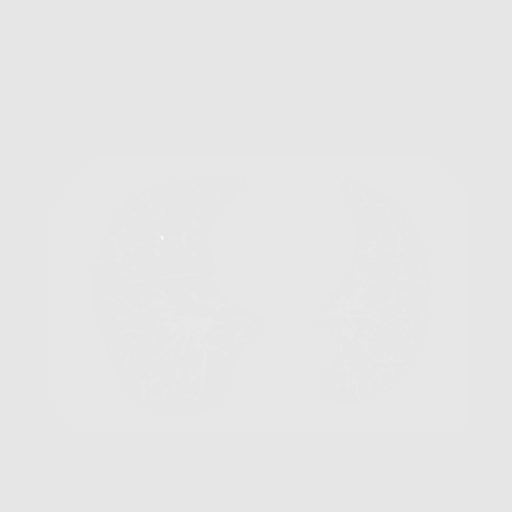
[frame 196/352  lung]
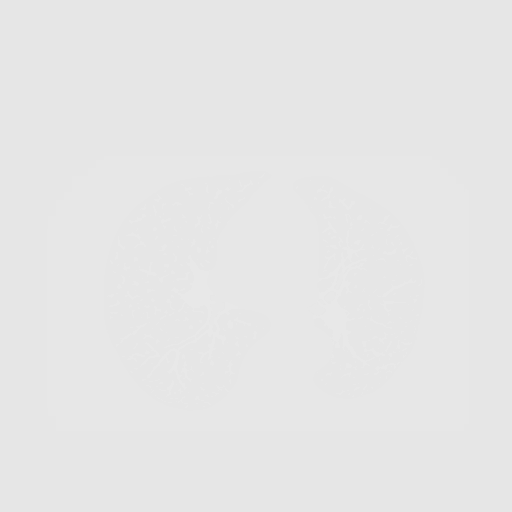
[frame 235/352  lung]
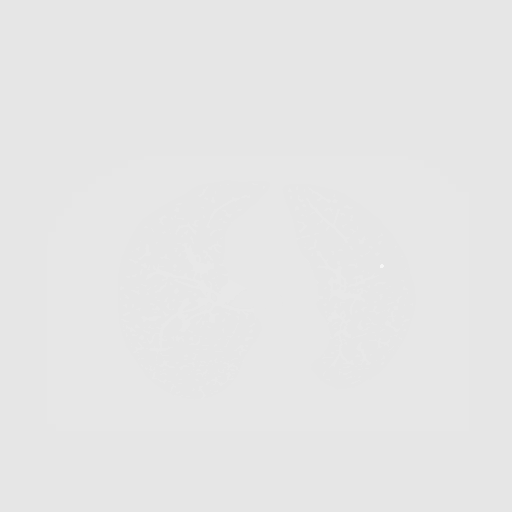
[frame 274/352  lung]
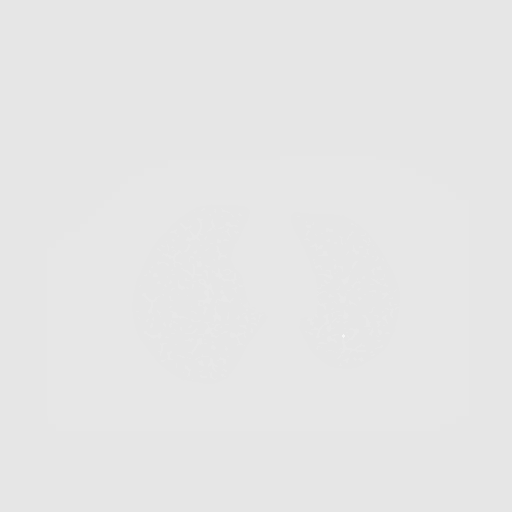
[frame 313/352  mediastinal]
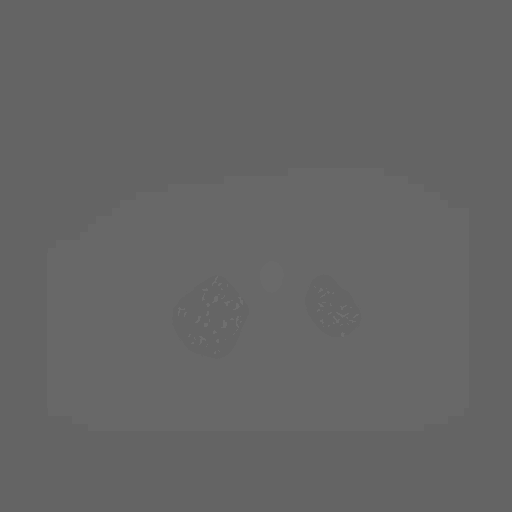
[frame 313/352  lung]
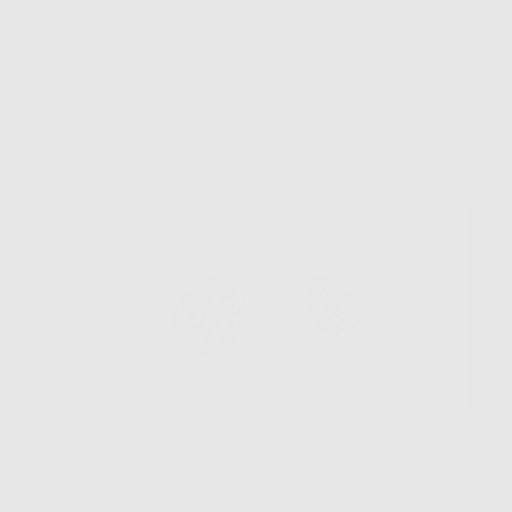
[frame 352/352  lung]
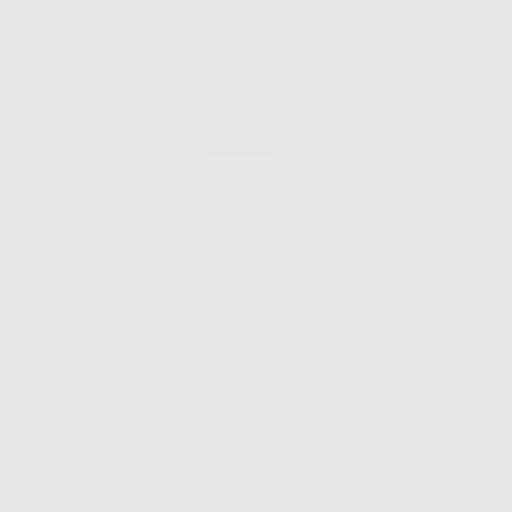

[10 of 10 positions shown; findings below may reference images not displayed]

FINDINGS: Cardiovascular: Aortic atherosclerosis. Ascending aortic aneurysm.
On the order of 4.3 cm within the proximal ascending aorta on 33/2,
similar to on the prior exam when measured in a similar fashion.

Normal transverse and descending aortic caliber with mild descending
thoracic tortuosity. Normal heart size, without pericardial
effusion. Left main and 3 vessel coronary artery calcification.
Aortic valve calcification.

Mediastinum/Nodes: No mediastinal or definite hilar adenopathy,
given limitations of unenhanced CT.

Lungs/Pleura: No pleural fluid. Mild to moderate centrilobular
emphysema. No suspicious pulmonary nodule or mass.

Upper Abdomen: Normal imaged portions of the liver, spleen, stomach,
pancreas, gallbladder. Mild bilateral adrenal thickening and
nodularity.

Normal left kidney. An upper pole right renal 2.0 cm lesion measures
greater than fluid density on 71/2. This was evaluated on 11/03/2018
dedicated CT and consistent with a hemorrhagic/proteinaceous cyst .
No follow-up necessary.

Musculoskeletal: No acute osseous abnormality.
IMPRESSION: 1. Lung-RADS 2, benign appearance or behavior. Continue annual
screening with low-dose chest CT without contrast in 12 months.
2. Similar ascending aortic aneurysm, maximally 4.3 cm. Recommend
attention on follow-up lung cancer screening CT.
3. Aortic Atherosclerosis (4L36K-4BB.B) and Emphysema (4L36K-6XZ.J).
Coronary artery atherosclerosis.
4. Aortic valvular calcifications. Consider echocardiography to
evaluate for valvular dysfunction.
5. Right renal hemorrhagic/proteinaceous cyst.

## 2023-08-27 ENCOUNTER — Other Ambulatory Visit: Payer: Self-pay | Admitting: Family Medicine

## 2023-08-27 DIAGNOSIS — Z122 Encounter for screening for malignant neoplasm of respiratory organs: Secondary | ICD-10-CM

## 2023-08-28 ENCOUNTER — Other Ambulatory Visit: Payer: Self-pay | Admitting: Cardiovascular Disease

## 2023-08-28 DIAGNOSIS — I4891 Unspecified atrial fibrillation: Secondary | ICD-10-CM

## 2023-08-28 NOTE — Telephone Encounter (Signed)
 Prescription refill request for Eliquis  received. Indication:afib Last office visit:needs appt Scr:na Age: 78 Weight:103.9  kg  Prescription refilled

## 2023-08-30 ENCOUNTER — Other Ambulatory Visit: Payer: Self-pay | Admitting: Family Medicine

## 2023-08-30 DIAGNOSIS — Z87891 Personal history of nicotine dependence: Secondary | ICD-10-CM

## 2023-09-05 ENCOUNTER — Other Ambulatory Visit: Payer: Self-pay | Admitting: Cardiovascular Disease

## 2023-09-05 DIAGNOSIS — I4891 Unspecified atrial fibrillation: Secondary | ICD-10-CM

## 2023-09-05 NOTE — Telephone Encounter (Signed)
 Pt has scheduled appt with Dr Delford on 10/03/23. Refill sent.

## 2023-09-05 NOTE — Telephone Encounter (Addendum)
 Prescription refill request for Eliquis  received. Indication: Afib  Last office visit: OVERDUE Scr: OVERDUE Age: 78 Weight: 103.9kg  Labs and office visit overdue. Message was sent to schedulers on 08/01/23. Sent another message to schedulers. I called pt and made him aware of overdue visit and overdue labs. Transferred call to scheduling.

## 2023-09-10 ENCOUNTER — Other Ambulatory Visit: Payer: Self-pay | Admitting: Family Medicine

## 2023-09-10 ENCOUNTER — Ambulatory Visit
Admission: RE | Admit: 2023-09-10 | Discharge: 2023-09-10 | Disposition: A | Discharge status: Home / Self Care | Source: Ambulatory Visit | Attending: Family Medicine | Admitting: Family Medicine

## 2023-09-10 DIAGNOSIS — Z87891 Personal history of nicotine dependence: Secondary | ICD-10-CM

## 2023-09-22 NOTE — Progress Notes (Signed)
 CARDIOLOGY OFFICE NOTE  Date:  10/03/2023    Gregg Flores Date of Birth: 1945-08-29 Medical Record #[id number]  PCP:  Burney Darice CROME, MD  Cardiologist:  Delford  History of Present Illness: Gregg Flores is a 78 y.o. male who presents today for f/u     He has a history of HTN, PAF, HLD and a dilated aortic root. He has had prior TEE/DCC back in 2017. CHADSVASC of at least 3. Stomach upset with Xarelto  and turned down for patient assistance with Eliquis  but remains on Eliquis .    CTA 02/10/20 with 4.3 cm ascending thoracic aorta  Stable on non constrast lung cancer CT 4.3 cm 04/26/21  He had lung cancer CT done 09/10/23 not read yet ? I reviewed images and measured his aorta in axial views as 4.2 cm   [employer] fan and disappointed again this year   Still smoking no motivation to quit  No cardiac issues Primary checks lhis blood work 3-4 times/year No bleeding issues on DOAC  Needs refill on his eliquis     Past Medical History:  Diagnosis Date   BPH (benign prostatic hyperplasia)    Cataracts, bilateral    Chronic LBP    Colon polyps    Diverticulosis    ED (erectile dysfunction)    Essential hypertension, benign 1990   Microalbuminuria    Monoclonal gammopathy 04/2008   Type II or unspecified type diabetes mellitus without mention of complication, not stated as uncontrolled     Past Surgical History:  Procedure Laterality Date   CARDIOVERSION N/A 12/08/2015   Procedure: CARDIOVERSION;  Surgeon: Ezra GORMAN Shuck, MD;  Location: [facility] ENDOSCOPY;  Service: Cardiovascular;  Laterality: N/A;   TEE WITHOUT CARDIOVERSION N/A 12/08/2015   Procedure: TRANSESOPHAGEAL ECHOCARDIOGRAM (TEE);  Surgeon: Ezra GORMAN Shuck, MD;  Location: [facility] ENDOSCOPY;  Service: Cardiovascular;  Laterality: N/A;   TRANSURETHRAL RESECTION OF PROSTATE       Medications: Current Meds  Medication Sig   allopurinol (ZYLOPRIM) 100 MG tablet Take 100 mg by mouth daily.   amLODipine  (NORVASC ) 10 MG  tablet take 1 tablet by mouth once daily   apixaban  (ELIQUIS ) 5 MG TABS tablet TAKE 1 TABLET(5 MG) BY MOUTH TWICE DAILY   atorvastatin  (LIPITOR) 40 MG tablet Take 40 mg by mouth daily.   finasteride (PROSCAR) 5 MG tablet Take 5 mg by mouth daily.   fluticasone (FLONASE) 50 MCG/ACT nasal spray Place 2 sprays into both nostrils daily.    gabapentin (NEURONTIN) 300 MG capsule Take 300 mg by mouth at bedtime as needed.   hydrochlorothiazide  (HYDRODIURIL ) 25 MG tablet Take 1 tablet (25 mg total) by mouth every morning.   metFORMIN  (GLUCOPHAGE ) 1000 MG tablet take 1 tablet by mouth twice a day with food   metoprolol  tartrate (LOPRESSOR ) 50 MG tablet take 1 tablet by mouth twice a day   tamsulosin (FLOMAX) 0.4 MG CAPS capsule Take 0.4 mg by mouth every 12 (twelve) hours.   varenicline (CHANTIX PAK) 0.5 MG X 11 & 1 MG X 42 tablet Take by mouth.   Vitamin D, Ergocalciferol, (DRISDOL) 1.25 MG (50000 UNIT) CAPS capsule Take 50,000 Units by mouth once a week.     Allergies: Allergies  Allergen Reactions   Shrimp [Shellfish Allergy] Other (See Comments)    Stomach issues    Social History: The patient  reports that he has been smoking cigarettes. He has a 21.5 pack-year smoking history. He has never used smokeless tobacco. He reports that  he does not drink alcohol and does not use drugs.   Family History: The patient's family history includes Diabetes in his brother; Heart disease in his mother; Hypertension in his mother.   Review of Systems: Please see the history of present illness.   All other systems are reviewed and negative.   Physical Exam: VS:  BP 108/60 (BP Location: Right Arm, Patient Position: Sitting, Cuff Size: Normal)   Pulse 96   Ht 5' 10 (1.778 m)   Wt 222 lb (100.7 kg)   SpO2 94%   BMI 31.85 kg/m  .  BMI Body mass index is 31.85 kg/m.  Wt Readings from Last 3 Encounters:  10/03/23 222 lb (100.7 kg)  06/11/23 229 lb (103.9 kg)  02/06/23 229 lb (103.9 kg)   Affect  appropriate Healthy:  appears stated age HEENT: normal Neck supple with no adenopathy JVP normal no bruits no thyromegaly Lungs clear with no wheezing and good diaphragmatic motion Heart:  S1/S2 SEM  murmur, no rub, gallop or click PMI normal Abdomen: benighn, BS positve, no tenderness, no AAA no bruit.  No HSM or HJR Distal pulses intact with no bruits No edema Neuro non-focal Skin warm and dry No muscular weakness    LABORATORY DATA:  EKG:  SR rate 60 LAD poor R wave progression PAC   Lab Results  Component Value Date   WBC 5.9 02/11/2019   HGB 13.8 02/11/2019   HCT 43.6 02/11/2019   PLT 269 02/11/2019   GLUCOSE 164 (H) 02/04/2020   CHOL 105 02/11/2019   TRIG 126 02/11/2019   HDL 29 (L) 02/11/2019   LDLCALC 53 02/11/2019   ALT 13 02/11/2019   AST 13 02/11/2019   NA 140 02/04/2020   K 4.2 02/04/2020   CL 104 02/04/2020   CREATININE 0.89 02/04/2020   BUN 14 02/04/2020   CO2 19 (L) 02/04/2020   TSH 1.450 08/25/2018   INR 1.1 11/24/2015   HGBA1C 7.0 (H) 02/05/2017   MICROALBUR 7.4 11/16/2014    Other Studies Reviewed Today:  CTA CHEST IMPRESSION:  02/10/20  4.3 cm    IMPRESSION: 1. Grossly stable 4.3 cm ascending thoracic aortic aneurysm. No dissection is noted. Recommend annual imaging followup by CTA or MRA. This recommendation follows 2010 ACCF/AHA/AATS/ACR/ASA/SCA/SCAI/SIR/STS/SVM Guidelines for the Diagnosis and Management of Patients with Thoracic Aortic Disease. Circulation. 2010; 121: Z733-z630. Aortic aneurysm NOS (ICD10-I71.9). 2. Moderate to severe coronary artery calcifications are noted suggesting coronary artery disease. 3. Emphysema and aortic atherosclerosis.   Aortic Atherosclerosis (ICD10-I70.0) and Emphysema (ICD10-J43.9).     Electronically Signed   By: Lynwood Landy Raddle M.D.   On: 02/10/2020 09:14   Procedure: TEE 2017 Findings: Please see echo section for full report.  The patient was in rapid atrial fibrillation.  Normal LV size  with moderate LV hypertrophy.  EF 45%, diffuse hypokinesis.  Normal RV size and systolic function.  Mild left atrial enlargement, no LA appendage thrombus.  Normal right atrium.  Trileaflet aortic valve with no stenosis or regurgitation.  Trivial MR.  There appeared to be a loose chord attached to the anterior mitral leaflet.  However, there was only mild mitral regurgitation.  No PFO/ASD, negative bubble study.  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       May proceed to DCCV.  Would repeat echo in sinus rhythm to confirm EF.    Ezra Shuck 12/08/2015     ECHO  08/09/15 Study Conclusions   - Left ventricle: The cavity size was normal. Systolic function was   normal. The estimated ejection fraction was in the range of 55%   to 60%. Wall motion was normal; there were no regional wall   motion abnormalities. The study was not technically sufficient to   allow evaluation of LV diastolic dysfunction due to atrial   fibrillation. - Aortic valve: Trileaflet; mildly thickened, mildly calcified   leaflets. Moderate focal calcification involving the noncoronary   cusp. - Aorta: Aortic root dimension: 39 mm (ED). Ascending aorta   diameter: 4.39 mm (ED). - Aortic root: The aortic root was mildly dilated. - Ascending aorta: The ascending aorta was mildly dilated. - Mitral valve: MIldly calcified chordae tendinae - Right ventricle: The cavity size was mildly dilated. Wall   thickness was normal.     Procedure: Electrical Cardioversion 2017 Indications:  Atrial Fibrillation   Procedure Details Consent: Risks of procedure as well as the alternatives and risks of each were explained to the (patient/caregiver).  Consent for procedure obtained. Time Out: Verified patient identification, verified procedure, site/side was marked, verified correct patient position, special equipment/implants available, medications/allergies/relevent  history reviewed, required imaging and test results available.  Performed   Patient placed on cardiac monitor, pulse oximetry, supplemental oxygen as necessary.  Sedation given: Propofol  per anesthesiology Pacer pads placed anterior and posterior chest.   Cardioverted 1 time(s).  Cardioverted at 200J.   Evaluation Findings: Post procedure EKG shows: NSR Complications: None Patient did tolerate procedure well.     Ezra Shuck 12/08/2015, 12:35 PM     Assessment & Plan:   1. PAF - prior cardioversion 11/2015 - remains in sinus  CHADSVASC of at least 4 - stable   2. HTN - Well controlled.  Continue current medications and low sodium Dash type diet.    3. HLD - on statin -  LDL 53   4. DM - Discussed low carb diet.  Target hemoglobin A1c is 6.5 or less.  Continue current medications.  5. Dilated ascending aorta -  4.2 cm by lung cancer CT 09/10/23  Follows with [facility] for abdominal aneurysm also 4.2 cm F/U US  per Dr Sheree  6. Chronic anticoagulation - no bleeding issues    7. Tobacco abuse -  Counseled on cessation < 10 minutes not motivated to quit Cancer CT 09/10/23 not read by radiology no cancer seen to my review   8.  Murmur:  ? AV sclerosis calcium  on AV noted on lung cancer CT No need for echo at this time      F/U in a year   Signed: Maude Emmer, MD  10/03/2023 8:28 AM

## 2023-10-03 ENCOUNTER — Ambulatory Visit: Attending: Cardiovascular Disease | Admitting: Cardiovascular Disease

## 2023-10-03 VITALS — BP 108/60 | HR 96 | Ht 70.0 in | Wt 222.0 lb

## 2023-10-03 DIAGNOSIS — I10 Essential (primary) hypertension: Secondary | ICD-10-CM

## 2023-10-03 DIAGNOSIS — I4891 Unspecified atrial fibrillation: Secondary | ICD-10-CM | POA: Diagnosis not present

## 2023-10-03 MED ORDER — APIXABAN 5 MG PO TABS
5.0000 mg | ORAL_TABLET | Freq: Two times a day (BID) | ORAL | 3 refills | Status: AC
Start: 2023-10-03 — End: ?

## 2023-10-03 NOTE — Patient Instructions (Signed)
 Medication Instructions:  Your physician recommends that you continue on your current medications as directed. Please refer to the Current Medication list given to you today.  *If you need a refill on your cardiac medications before your next appointment, please call your pharmacy*  Lab Work: If you have labs (blood work) drawn today and your tests are completely normal, you will receive your results only by: MyChart Message (if you have MyChart) OR A paper copy in the mail If you have any lab test that is abnormal or we need to change your treatment, we will call you to review the results.  Follow-Up: At [facility], you and your health needs are our priority.  As part of our continuing mission to provide you with exceptional heart care, our providers are all part of one team.  This team includes your primary Cardiologist (physician) and Advanced Practice Providers or APPs (Physician Assistants and Nurse Practitioners) who all work together to provide you with the care you need, when you need it.  Your next appointment:   1 year(s)  Provider:   Janelle Mediate, MD    We recommend signing up for the patient portal called "MyChart".  Sign up information is provided on this After Visit Summary.  MyChart is used to connect with patients for Virtual Visits (Telemedicine).  Patients are able to view lab/test results, encounter notes, upcoming appointments, etc.  Non-urgent messages can be sent to your provider as well.   To learn more about what you can do with MyChart, go to [email or web address].

## 2023-10-08 ENCOUNTER — Ambulatory Visit: Admitting: Podiatry

## 2023-10-08 DIAGNOSIS — B351 Tinea unguium: Secondary | ICD-10-CM | POA: Diagnosis not present

## 2023-10-08 DIAGNOSIS — M79609 Pain in unspecified limb: Secondary | ICD-10-CM

## 2023-10-08 DIAGNOSIS — F17200 Nicotine dependence, unspecified, uncomplicated: Secondary | ICD-10-CM

## 2023-10-08 DIAGNOSIS — L84 Corns and callosities: Secondary | ICD-10-CM

## 2023-10-08 DIAGNOSIS — N183 Chronic kidney disease, stage 3 unspecified: Secondary | ICD-10-CM

## 2023-10-08 DIAGNOSIS — E1122 Type 2 diabetes mellitus with diabetic chronic kidney disease: Secondary | ICD-10-CM | POA: Diagnosis not present

## 2023-10-08 DIAGNOSIS — I739 Peripheral vascular disease, unspecified: Secondary | ICD-10-CM

## 2023-10-08 NOTE — Progress Notes (Unsigned)
  Subjective:  Patient ID: Gregg Flores, male    DOB: [birth year],  MRN: [id number]  Gregg Flores presents to clinic today for {jgcomplaint:23593}  Chief Complaint  Patient presents with   Nail Problem    Thick painful toenails, 4 month follow up    New problem(s): None. {jgcomplaint:23593}  PCP is Burney Darice CROME, MD.  Allergies  Allergen Reactions   Shrimp [Shellfish Allergy] Other (See Comments)    Stomach issues    Review of Systems: Negative except as noted in the HPI.  Objective: No changes noted in today's physical examination. There were no vitals filed for this visit. Gregg Flores is a pleasant 78 y.o. male {jgbodyhabitus:24098} AAO x 3.  Vascular Examination: CFT <3 seconds b/l. DP/PT pulses faintly palpable b/l. Skin temperature gradient warm to warm b/l. No pain with calf compression. No ischemia or gangrene. No cyanosis or clubbing noted b/l.    Neurological Examination: Sensation grossly intact b/l with 10 gram monofilament. Vibratory sensation intact b/l.   Dermatological Examination: Pedal skin warm and supple b/l.   No open wounds. No interdigital macerations.  Toenails 1-5 b/l well maintained with adequate length. No erythema, no edema, no drainage, no fluctuance. Porokeratotic lesion(s) submet head 5 right foot. No erythema, no edema, no drainage, no fluctuance.  Musculoskeletal Examination: Muscle strength 5/5 to all lower extremity muscle groups bilaterally. HAV with bunion deformity noted b/l LE. Hammertoe(s) bilateral 2nd toes.  Radiographs: None  Assessment/Plan: Orders Placed This Encounter  Procedures   VAS US  ABI WITH/WO TBI     1. Pain due to onychomycosis of nail   2. Pre-ulcerative calluses   3. Current every day smoker   4. Type 2 diabetes mellitus with stage 3 chronic kidney disease, without long-term current use of insulin, unspecified whether stage 3a or 3b CKD (HCC)   {Jgplan:23602::-Patient/POA to call should  there be question/concern in the interim.}   Return in about 3 months (around 01/07/2024).  Delon CROME Merlin, DPM      [place] LOCATION: [address] [place] [address]                   Office [phone]   [facility] LOCATION: [address] [place], [place] [address] Office [phone]

## 2023-10-13 ENCOUNTER — Encounter: Payer: Self-pay | Admitting: Podiatry

## 2023-10-25 ENCOUNTER — Ambulatory Visit (HOSPITAL_COMMUNITY)
Admission: RE | Admit: 2023-10-25 | Discharge: 2023-10-25 | Disposition: A | Discharge status: Home / Self Care | Source: Ambulatory Visit | Attending: Podiatry | Admitting: Podiatry

## 2023-10-25 DIAGNOSIS — N183 Chronic kidney disease, stage 3 unspecified: Secondary | ICD-10-CM | POA: Diagnosis present

## 2023-10-25 DIAGNOSIS — I739 Peripheral vascular disease, unspecified: Secondary | ICD-10-CM | POA: Diagnosis present

## 2023-10-25 DIAGNOSIS — E1122 Type 2 diabetes mellitus with diabetic chronic kidney disease: Secondary | ICD-10-CM | POA: Diagnosis present

## 2023-10-25 DIAGNOSIS — F17200 Nicotine dependence, unspecified, uncomplicated: Secondary | ICD-10-CM | POA: Diagnosis present

## 2023-10-25 LAB — VAS US ABI WITH/WO TBI
Left ABI: 0.81
Right ABI: 1.01

## 2023-11-28 ENCOUNTER — Ambulatory Visit: Admitting: Gastroenterology

## 2023-11-28 ENCOUNTER — Encounter: Payer: Self-pay | Admitting: Gastroenterology

## 2023-11-28 ENCOUNTER — Telehealth: Payer: Self-pay

## 2023-11-28 VITALS — BP 104/62 | HR 84 | Ht 70.0 in | Wt 224.1 lb

## 2023-11-28 DIAGNOSIS — Z860100 Personal history of colon polyps, unspecified: Secondary | ICD-10-CM | POA: Diagnosis not present

## 2023-11-28 DIAGNOSIS — R194 Change in bowel habit: Secondary | ICD-10-CM

## 2023-11-28 DIAGNOSIS — Z7902 Long term (current) use of antithrombotics/antiplatelets: Secondary | ICD-10-CM | POA: Diagnosis not present

## 2023-11-28 NOTE — Patient Instructions (Addendum)
 It has been recommended to you by your physician that you have a(n) colonoscopy completed. Per your request, we did not schedule the procedure(s) today. Please contact our office at [phone] should you decide to have the procedure completed. You will be scheduled for a pre-visit and procedure at that time.  Thank you for trusting me with your gastrointestinal care!    Dr. Victory Legrand [facility]

## 2023-11-28 NOTE — Progress Notes (Signed)
  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 Debrah May 2010.  A follow-up colonoscopy later that year. Last colonoscopy in 2014 with a diminutive polypoid piece of tissue that was normal.  Subjective  HPI:  This is a very pleasant 78 year old man here to see us  today to discuss a surveillance colonoscopy.  He has a history of colon polyps as described above.  Will he denies abdominal pain or rectal bleeding.  It sounds like he may have some irregularity of bowel movements but this has been an issue for a long time.  He denies dysphagia, odynophagia, nausea, vomiting, loss of appetite or unexpected weight loss. Gets tired with activity, though no chest pain or lightheadedness.  Denies palpitations, and he recently saw his cardiologist (see below)   ROS:  Review of Systems  Constitutional:  Positive for fatigue. Negative for appetite change and unexpected weight change.  HENT:  Negative for mouth sores and voice change.   Eyes:  Negative for pain and redness.  Respiratory:  Negative for cough and shortness of breath.   Cardiovascular:  Negative for chest pain and palpitations.  Genitourinary:  Negative for dysuria and hematuria.  Musculoskeletal:  Positive for arthralgias. Negative for myalgias.  Skin:  Negative for pallor and rash.  Neurological:  Negative for weakness and headaches.  Hematological:  Negative for adenopathy.     Past Medical History: Past Medical History:  Diagnosis Date   BPH (benign prostatic hyperplasia)    Cataracts, bilateral    Chronic LBP    Colon polyps    Diverticulosis    ED (erectile dysfunction)    Essential  hypertension, benign 1990   Microalbuminuria    Monoclonal gammopathy 04/2008   Type II or unspecified type diabetes mellitus without mention of complication, not stated as uncontrolled    From September 2025 cardiology clinic note by Dr. Delford:  1. PAF - prior cardioversion 11/2015 - remains in sinus  CHADSVASC of at least 4 - stable    2. HTN - Well controlled.  Continue current medications and low sodium Dash type diet.     3. HLD - on statin -  LDL 53    4. DM - Discussed low carb diet.  Target hemoglobin A1c is 6.5 or less.  Continue current medications.   5. Dilated ascending aorta -  4.2 cm by lung cancer CT 09/10/23  Follows with [facility] for abdominal aneurysm also 4.2 cm F/U US  per Dr Sheree   6. Chronic anticoagulation - no bleeding issues     7. Tobacco abuse -  Counseled on cessation < 10 minutes not motivated to quit Cancer CT 09/10/23 not read by radiology no cancer seen to my review    8.  Murmur:  ? AV sclerosis calcium  on AV noted on lung cancer CT No need for echo at this time  Past Surgical History: Past Surgical History:  Procedure Laterality Date   CARDIOVERSION N/A 12/08/2015   Procedure: CARDIOVERSION;  Surgeon: Ezra GORMAN Shuck, MD;  Location: [facility] ENDOSCOPY;  Service: Cardiovascular;  Laterality: N/A;   TEE WITHOUT CARDIOVERSION N/A 12/08/2015   Procedure: TRANSESOPHAGEAL ECHOCARDIOGRAM (TEE);  Surgeon: Ezra GORMAN Shuck, MD;  Location: [facility] ENDOSCOPY;  Service: Cardiovascular;  Laterality: N/A;  TRANSURETHRAL RESECTION OF PROSTATE       Family History: Family History  Problem Relation Age of Onset   Heart disease Mother        s/p CABG   Hypertension Mother    Diabetes Brother    Colon cancer Neg Hx     Social History: Social History   Socioeconomic History   Marital status: Divorced    Spouse name: n/a   Number of children: 3   Years of education: 10   Highest education level: Not on file  Occupational History   Occupation: retired-Paint Merchandiser, Retail     Comment: [employer]  Tobacco Use   Smoking status: Every Day    Current packs/day: 0.50    Average packs/day: 0.5 packs/day for 43.0 years (21.5 ttl pk-yrs)    Types: Cigarettes   Smokeless tobacco: Never   Tobacco comments:    has cut back to 0.5 ppd (09/27/15) using patches  Vaping Use   Vaping status: Never Used  Substance and Sexual Activity   Alcohol use: No    Alcohol/week: 0.0 - 1.0 standard drinks of alcohol    Comment: occasionally, wine and liquour    Drug use: No   Sexual activity: Yes    Birth control/protection: Condom  Other Topics Concern   Not on file  Social History Narrative   Lives alone in a duplex.  His son lives on the other side.    Social Drivers of Corporate Investment Banker Strain: Not on file  Food Insecurity: Not on file  Transportation Needs: Not on file  Physical Activity: Not on file  Stress: Not on file  Social Connections: Unknown (02/16/2022)   Received from [facility]   Social Network    Social Network: Not on file    Allergies: Allergies  Allergen Reactions   Shrimp [Shellfish Allergy] Other (See Comments)    Stomach issues    Outpatient Meds: Current Outpatient Medications  Medication Sig Dispense Refill   allopurinol (ZYLOPRIM) 100 MG tablet Take 100 mg by mouth daily.     amLODipine  (NORVASC ) 10 MG tablet take 1 tablet by mouth once daily 90 tablet 1   apixaban  (ELIQUIS ) 5 MG TABS tablet Take 1 tablet (5 mg total) by mouth 2 (two) times daily. 180 tablet 3   atorvastatin  (LIPITOR) 40 MG tablet Take 40 mg by mouth daily.     benazepril  (LOTENSIN ) 40 MG tablet take 1 tablet by mouth once daily 90 tablet 1   finasteride (PROSCAR) 5 MG tablet Take 5 mg by mouth daily.     gabapentin (NEURONTIN) 300 MG capsule Take 300 mg by mouth at bedtime as needed.     hydrochlorothiazide  (HYDRODIURIL ) 25 MG tablet Take 1 tablet (25 mg total) by mouth every morning. 90 tablet 3   metFORMIN  (GLUCOPHAGE ) 1000 MG tablet take 1  tablet by mouth twice a day with food 180 tablet 1   metoprolol  tartrate (LOPRESSOR ) 50 MG tablet take 1 tablet by mouth twice a day 180 tablet 1   tamsulosin (FLOMAX) 0.4 MG CAPS capsule Take 0.4 mg by mouth every 12 (twelve) hours.     BREZTRI AEROSPHERE 160-9-4.8 MCG/ACT AERO SMARTSIG:2 Puff(s) By Mouth Twice Daily (Patient not taking: Reported on 11/28/2023)     cetirizine (ZYRTEC) 10 MG tablet Take 10 mg by mouth daily. (Patient not taking: Reported on 11/28/2023)     fluticasone (FLONASE) 50 MCG/ACT nasal spray Place 2 sprays into both nostrils daily.  (Patient not  taking: Reported on 11/28/2023)     ipratropium (ATROVENT ) 0.03 % nasal spray Place 2 sprays into the nose every 12 (twelve) hours. (Patient not taking: Reported on 11/28/2023) 90 mL 3   mupirocin  ointment (BACTROBAN ) 2 % Apply to affected toe once daily. (Patient not taking: Reported on 11/28/2023) 30 g 1   sitaGLIPtin  (JANUVIA ) 50 MG tablet Take 50 mg by mouth daily. (Patient not taking: Reported on 11/28/2023)     varenicline (CHANTIX PAK) 0.5 MG X 11 & 1 MG X 42 tablet Take by mouth. (Patient not taking: Reported on 11/28/2023)     Vitamin D, Ergocalciferol, (DRISDOL) 1.25 MG (50000 UNIT) CAPS capsule Take 50,000 Units by mouth once a week. (Patient not taking: Reported on 11/28/2023)     No current facility-administered medications for this visit.      ___________________________________________________________________ Objective   Exam:  BP 104/62   Pulse 84   Ht 5' 10 (1.778 m) Comment: measured without shoes  Wt 224 lb 2 oz (101.7 kg)   BMI 32.16 kg/m  Wt Readings from Last 3 Encounters:  11/28/23 224 lb 2 oz (101.7 kg)  10/03/23 222 lb (100.7 kg)  06/11/23 229 lb (103.9 kg)    General: Well-appearing, ambulatory, pleasant and conversational.  Gets on exam table without assistance Eyes: sclera anicteric, no redness ENT: oral mucosa moist without lesions, no cervical or supraclavicular lymphadenopathy CV:  Irregularly irregular, no appreciable murmur, no JVD, trace lower extremity edema bilaterally Resp: clear to auscultation bilaterally, normal RR and effort noted GI: soft, no tenderness, with active bowel sounds. No guarding or palpable organomegaly noted. Skin; warm and dry, no rash or jaundice noted Neuro: awake, alert and oriented x 3. Normal gross motor function and fluent speech  Data:  July 2017 echocardiogram (prior to cardioversion several months later)  Study Conclusions   - Left ventricle: The cavity size was normal. Systolic function was    normal. The estimated ejection fraction was in the range of 55%    to 60%. Wall motion was normal; there were no regional wall    motion abnormalities. The study was not technically sufficient to    allow evaluation of LV diastolic dysfunction due to atrial    fibrillation.  - Aortic valve: Trileaflet; mildly thickened, mildly calcified    leaflets. Moderate focal calcification involving the noncoronary    cusp.  - Aorta: Aortic root dimension: 39 mm (ED). Ascending aorta    diameter: 4.39 mm (ED).  - Aortic root: The aortic root was mildly dilated.  - Ascending aorta: The ascending aorta was mildly dilated.  - Mitral valve: MIldly calcified chordae tendinae  - Right ventricle: The cavity size was mildly dilated. Wall    thickness was normal.     Assessment: Encounter Diagnoses  Name Primary?   Altered bowel habits Yes   Hx of colonic polyps    Long term (current) use of antithrombotics/antiplatelets     He is due for surveillance colonoscopy, and I think he is appropriate for colonoscopy in our endoscopy center with his stable cardiac condition.  We discussed the procedure in detail along with its rationale, risks and benefits and he was agreeable.  The benefits and risks of the planned procedure(s) were described in detail with the patient or (when appropriate) their health care proxy.  Risks were outlined as including, but not  limited to, bleeding, infection, perforation, adverse medication reaction leading to cardiac or pulmonary decompensation, pancreatitis (if ERCP).  The limitation of incomplete  mucosal visualization was also discussed.  No guarantees or warranties were given.  2-day Eliquis  hold needed, and we will check with his cardiologist on this (I expect they are likely to be agreeable). Will he understands the small but real risk of stroke during the brief hold of oral anticoagulation.  His son lives next-door, and that would be his likely care partner for the procedure today.  Will he has some other health care appointments in the next few months but cannot recall all the specific dates.  So we are going to help him set a date for his colonoscopy in November or December, and then he will check his other calendar when he returns home and contact us  if he needs to change the procedure date.  Thank you for the courtesy of this consult.  Please call me with any questions or concerns.  Victory LITTIE Brand III  CC: Referring provider noted above

## 2023-11-28 NOTE — Telephone Encounter (Signed)
 [facility] Pre-operative Risk Assessment     Request for surgical clearance:     Endoscopy Procedure  What type of surgery is being performed?     colon  When is this surgery scheduled?     TBD  What type of clearance is required ?   Pharmacy  Are there any medications that need to be held prior to surgery and how long? Eliquis  2 days  Practice name and name of physician performing surgery?      [facility]  What is your office phone and fax number?      Phone- [phone]  Fax- [phone]  Anesthesia type (None, local, MAC, general) ?       MAC   Please route your response to Corean [place], [facility]

## 2023-12-06 NOTE — Telephone Encounter (Signed)
 Patient requesting a call from [facility] to discuss colonoscopy further. Please advise, thank you

## 2023-12-09 ENCOUNTER — Other Ambulatory Visit: Payer: Self-pay | Admitting: *Deleted

## 2023-12-09 DIAGNOSIS — Z79899 Other long term (current) drug therapy: Secondary | ICD-10-CM

## 2023-12-09 NOTE — Telephone Encounter (Signed)
 Spoke with pt and made him aware of the need for labs to which he states his understanding and agrees with this plan. Labs ordered and released and pt states that he will come to the [facility] here in our building tomorrow to have these drawn.

## 2023-12-09 NOTE — Telephone Encounter (Signed)
 Patient called requesting to speak regarding colonoscopy. Please advise, thank you

## 2023-12-09 NOTE — Telephone Encounter (Signed)
 Patient with diagnosis of PAF on Eliquis   for anticoagulation.    Procedure: Colonoscopy  Date of procedure: TBD    CHA2DS2-VASc Score = 4   This indicates a 4.8% annual risk of stroke. The patient's score is based upon: CHF History: 0 HTN History: 1 Diabetes History: 1 Stroke History: 0 Vascular Disease History: 0 Age Score: 2 Gender Score: 0     CrCl need updated lab  Platelet count need updated lab   Patient has not  had an Afib/aflutter ablation in the last 3 months, DCCV within the last 4 weeks or a watchman implanted in the last 45 days    Per office protocol, patient can hold Eliquis  for -- days prior to procedure.   Patient will not need bridging with Lovenox (enoxaparin) around procedure.  **This guidance is not considered finalized until pre-operative APP has relayed final recommendations.**

## 2023-12-10 NOTE — Telephone Encounter (Signed)
 Left message for pt to call back. I was calling to follow up on when he have labs done.

## 2023-12-10 NOTE — Telephone Encounter (Signed)
 Pt is scheduled for colon on 01/17/24 and pre visit on 01/03/24. He wanted to mention his care partner is his friend Ubaldo T. He is requesting no personal information be shared with his care partner.

## 2023-12-10 NOTE — Telephone Encounter (Signed)
 Thanks for making me aware of the HIPAA issue wit his care partner. His stretcher will be flagged with a HIPAA hang tag when he conveys that to the intake team the day of [facility] visit.  Also, the cardiology pharmacist note has not yet given final recommendation on how long we can hold Eliquis  (see their note), presumably until results of some labs they ordered. Please follow up on that in the next couple weeks.  - H. Danis

## 2023-12-11 ENCOUNTER — Ambulatory Visit: Payer: Self-pay | Admitting: Cardiovascular Disease

## 2023-12-11 LAB — CBC
Hematocrit: 42.6 % (ref 37.5–51.0)
Hemoglobin: 13.9 g/dL (ref 13.0–17.7)
MCH: 28.8 pg (ref 26.6–33.0)
MCHC: 32.6 g/dL (ref 31.5–35.7)
MCV: 88 fL (ref 79–97)
Platelets: 196 x10E3/uL (ref 150–450)
RBC: 4.83 x10E6/uL (ref 4.14–5.80)
RDW: 13.4 % (ref 11.6–15.4)
WBC: 4.8 x10E3/uL (ref 3.4–10.8)

## 2023-12-11 LAB — BASIC METABOLIC PANEL WITH GFR
BUN/Creatinine Ratio: 15 (ref 10–24)
BUN: 16 mg/dL (ref 8–27)
CO2: 20 mmol/L (ref 20–29)
Calcium: 9 mg/dL (ref 8.6–10.2)
Chloride: 103 mmol/L (ref 96–106)
Creatinine, Ser: 1.07 mg/dL (ref 0.76–1.27)
Glucose: 158 mg/dL — AB (ref 70–99)
Potassium: 4.3 mmol/L (ref 3.5–5.2)
Sodium: 139 mmol/L (ref 134–144)
eGFR: 71 mL/min/1.73 (ref >59)

## 2023-12-11 NOTE — Telephone Encounter (Signed)
 Left detailed VM and mailed letter. 12/11/2023

## 2023-12-13 NOTE — Telephone Encounter (Signed)
   Patient Name: Gregg Flores  DOB: 1945/10/02 MRN: [id number]  Primary Cardiologist: Maude Emmer, MD  Chart reviewed as part of pre-operative protocol coverage. Patient has an upcoming colonoscopy planned and we were asked to provide recommendations for holding Eliquis .   Per Pharmacy and office protocol, patient can hold Eliquis  for 2 day prior to procedure. Please restart as soon as safely possible after procedure.   I will route this recommendation to the requesting party via Epic fax function and remove from pre-op pool.  Please call with questions.  Cesareo Vickrey E Izzy Courville, PA-C 12/13/2023, 3:45 PM

## 2023-12-13 NOTE — Telephone Encounter (Signed)
 Pt informed to hold eliquis  2 days prior to procedure. Pt states understanding and has pre op appt on 01/03/24

## 2023-12-13 NOTE — Telephone Encounter (Addendum)
 Patient with diagnosis of atrial fibrillation on Eliquis  5 mg twice daily for anticoagulation.    Procedure: colonscopy Date of procedure: 01/17/2024   CHA2DS2-VASc Score = 4   This indicates a 4.8% annual risk of stroke. The patient's score is based upon: CHF History: 0 HTN History: 1 Diabetes History: 1 Stroke History: 0 Vascular Disease History: 0 Age Score: 2 Gender Score: 0     CrCl 67 Platelet count 196  Patient has not had an Afib/aflutter ablation in the last 3 months, DCCV within the last 4 weeks or a watchman implanted in the last 45 days   Per office protocol, patient can hold Eliquis  for 2 day prior to procedure.    **This guidance is not considered finalized until pre-operative APP has relayed final recommendations.**

## 2023-12-13 NOTE — Telephone Encounter (Signed)
 Pharmacy, it looks like he had labs completed on 12/10/2023. Can you please provide recommendations for holding Eliquis  for upcoming procedure?  Thank you!

## 2023-12-13 NOTE — Telephone Encounter (Signed)
 Will send to preop APP to review if pt needs appt. Pharm recommendations are in.

## 2024-01-03 ENCOUNTER — Ambulatory Visit

## 2024-01-03 VITALS — Ht 70.5 in | Wt 225.0 lb

## 2024-01-03 DIAGNOSIS — Z860100 Personal history of colon polyps, unspecified: Secondary | ICD-10-CM

## 2024-01-03 MED ORDER — NA SULFATE-K SULFATE-MG SULF 17.5-3.13-1.6 GM/177ML PO SOLN: 1.0000 | Freq: Once | ORAL | 0 refills | Status: AC

## 2024-01-03 NOTE — Progress Notes (Signed)
 Pre visit completed via phone call; Patient verified name, DOB, and address; No egg or soy allergy known to patient;  No issues known to pt with past sedation with any surgeries or procedures; Patient denies ever being told they had issues or difficulty with intubation;  No FH of Malignant Hyperthermia; Pt is not on diet pills; Pt is not on home 02;  Pt is not on blood thinners;  Pt denies issues with constipation;  No A fib or A flutter; Have any cardiac testing pending--NO Insurance verified during PV appt--- [facility] Medicare Pt can ambulate without assistance;  Pt denies use of chewing tobacco; Discussed diabetic/weight loss medication holds; Discussed NSAID holds; Checked BMI to be less than 50; Pt instructed to use Singlecare.com or GoodRx for a price reduction on prep;  Patient's chart reviewed by Norleen Schillings CNRA prior to previsit and patient appropriate for the [facility];  Pre visit completed and red dot placed by patient's name on their procedure day (on provider's schedule);   Instructions printed and placed on 2nd floor for patient to pick up at his convenience;

## 2024-01-09 ENCOUNTER — Encounter: Payer: Self-pay | Admitting: Gastroenterology

## 2024-01-17 ENCOUNTER — Encounter: Payer: Self-pay | Admitting: Gastroenterology

## 2024-01-17 ENCOUNTER — Ambulatory Visit (AMBULATORY_SURGERY_CENTER): Admitting: Gastroenterology

## 2024-01-17 VITALS — BP 96/78 | HR 98 | Temp 97.7°F | Resp 22 | Ht 70.5 in | Wt 225.0 lb

## 2024-01-17 DIAGNOSIS — Z860101 Personal history of adenomatous and serrated colon polyps: Secondary | ICD-10-CM

## 2024-01-17 DIAGNOSIS — K5730 Diverticulosis of large intestine without perforation or abscess without bleeding: Secondary | ICD-10-CM | POA: Diagnosis not present

## 2024-01-17 DIAGNOSIS — D124 Benign neoplasm of descending colon: Secondary | ICD-10-CM | POA: Diagnosis not present

## 2024-01-17 DIAGNOSIS — R194 Change in bowel habit: Secondary | ICD-10-CM

## 2024-01-17 DIAGNOSIS — K648 Other hemorrhoids: Secondary | ICD-10-CM | POA: Diagnosis not present

## 2024-01-17 DIAGNOSIS — Z1211 Encounter for screening for malignant neoplasm of colon: Secondary | ICD-10-CM | POA: Diagnosis not present

## 2024-01-17 DIAGNOSIS — D123 Benign neoplasm of transverse colon: Secondary | ICD-10-CM | POA: Diagnosis not present

## 2024-01-17 DIAGNOSIS — K621 Rectal polyp: Secondary | ICD-10-CM | POA: Diagnosis not present

## 2024-01-17 DIAGNOSIS — D128 Benign neoplasm of rectum: Secondary | ICD-10-CM

## 2024-01-17 DIAGNOSIS — Z860100 Personal history of colon polyps, unspecified: Secondary | ICD-10-CM

## 2024-01-17 MED ORDER — SODIUM CHLORIDE 0.9 % IV SOLN: 500.0000 mL | Freq: Once | INTRAVENOUS | Status: DC

## 2024-01-17 NOTE — Patient Instructions (Addendum)
 Resume Eliquis  (apixaban ) at prior dose tomorrow. Await pathology results. No repeat surveillance colonoscopy recommended due to age and current guidelines.  Please read over handouts provided   YOU HAD AN ENDOSCOPIC PROCEDURE TODAY AT THE [facility]:   Refer to the procedure report that was given to you for any specific questions about what was found during the examination.  If the procedure report does not answer your questions, please call your gastroenterologist to clarify.  If you requested that your care partner not be given the details of your procedure findings, then the procedure report has been included in a sealed envelope for you to review at your convenience later.  YOU SHOULD EXPECT: Some feelings of bloating in the abdomen. Passage of more gas than usual.  Walking can help get rid of the air that was put into your GI tract during the procedure and reduce the bloating. If you had a lower endoscopy (such as a colonoscopy or flexible sigmoidoscopy) you may notice spotting of blood in your stool or on the toilet paper. If you underwent a bowel prep for your procedure, you may not have a normal bowel movement for a few days.  Please Note:  You might notice some irritation and congestion in your nose or some drainage.  This is from the oxygen used during your procedure.  There is no need for concern and it should clear up in a day or so.  SYMPTOMS TO REPORT IMMEDIATELY:  Following lower endoscopy (colonoscopy or flexible sigmoidoscopy):  Excessive amounts of blood in the stool  Significant tenderness or worsening of abdominal pains  Swelling of the abdomen that is new, acute  Fever of 100F or higher  For urgent or emergent issues, a gastroenterologist can be reached at any hour by calling [phone]. Do not use MyChart messaging for urgent concerns.    DIET:  We do recommend a small meal at first, but then you may proceed to your regular diet.  Drink plenty of  fluids but you should avoid alcoholic beverages for 24 hours.  ACTIVITY:  You should plan to take it easy for the rest of today and you should NOT DRIVE or use heavy machinery until tomorrow (because of the sedation medicines used during the test).    FOLLOW UP: Our staff will call the number listed on your records the next business day following your procedure.  We will call around 7:15- 8:00 am to check on you and address any questions or concerns that you may have regarding the information given to you following your procedure. If we do not reach you, we will leave a message.     If any biopsies were taken you will be contacted by phone or by letter within the next 1-3 weeks.  Please call us  at [phone] if you have not heard about the biopsies in 3 weeks.    SIGNATURES/CONFIDENTIALITY: You and/or your care partner have signed paperwork which will be entered into your electronic medical record.  These signatures attest to the fact that that the information above on your After Visit Summary has been reviewed and is understood.  Full responsibility of the confidentiality of this discharge information lies with you and/or your care-partner.

## 2024-01-17 NOTE — Progress Notes (Signed)
 Called to room to assist during endoscopic procedure.  Patient ID and intended procedure confirmed with present staff. Received instructions for my participation in the procedure from the performing physician.

## 2024-01-17 NOTE — Progress Notes (Signed)
 Pt didn't pass air while in the RR.  Abdomen soft, no c/o pain or need to pass air.  Told to ambulate, drink warm fluids if he develops abdominal cramping at home- understanding voiced

## 2024-01-17 NOTE — Op Note (Signed)
 [facility] Patient Name: Gregg Flores Procedure Date: 01/17/2024 7:36 AM MRN: [id number] Endoscopist: Victory L. Legrand , MD, [id number] Age: 78 Referring MD:  Date of Birth: September 27, 1945 Gender: Male Account #: [id number] Procedure:                Colonoscopy Indications:              Surveillance: Personal history of adenomatous                            polyps on last colonoscopy > 5 years ago                           Polyp details in recent office note Medicines:                Monitored Anesthesia Care Procedure:                Pre-Anesthesia Assessment:                           - Prior to the procedure, a History and Physical                            was performed, and patient medications and                            allergies were reviewed. The patient's tolerance of                            previous anesthesia was also reviewed. The risks                            and benefits of the procedure and the sedation                            options and risks were discussed with the patient.                            All questions were answered, and informed consent                            was obtained. Prior Anticoagulants: The patient has                            taken Eliquis  (apixaban ), last dose was 2 days                            prior to procedure. ASA Grade Assessment: III - A                            patient with severe systemic disease. After                            reviewing the risks and benefits, the patient was  deemed in satisfactory condition to undergo the                            procedure.                           After obtaining informed consent, the colonoscope                            was passed under direct vision. Throughout the                            procedure, the patient's blood pressure, pulse, and                            oxygen saturations were monitored continuously. The                             Olympus Scope SN: [id number] was introduced through                            the anus and advanced to the the cecum, identified                            by appendiceal orifice and ileocecal valve. The                            colonoscopy was somewhat difficult due to a                            redundant colon. Successful completion of the                            procedure was aided by using manual pressure and                            straightening and shortening the scope to obtain                            bowel loop reduction. The patient tolerated the                            procedure well. The quality of the bowel                            preparation was good after lavage. The ileocecal                            valve, appendiceal orifice, and rectum were                            photographed. Scope In: 8:10:55 AM Scope Out: 8:32:04 AM Scope Withdrawal Time: 0 hours 16 minutes 44 seconds  Total Procedure Duration: 0 hours 21 minutes 9 seconds  Findings:                 The perianal and digital rectal examinations were                            normal.                           Repeat examination of right colon under NBI                            performed.                           Multiple small-mouthed diverticula were found in                            the left colon.                           Four sessile polyps were found in the descending                            colon and transverse colon. The polyps were 3 to 6                            mm in size. These polyps were removed with a cold                            snare. Resection and retrieval were complete.                           Four flat and sessile polyps were found in the                            rectum. The polyps were diminutive in size. These                            polyps were removed with a cold snare. Resection                            and retrieval were complete.                            Internal hemorrhoids were found. The hemorrhoids                            were small.                           The exam was otherwise without abnormality on                            direct and retroflexion views. Complications:            No immediate complications. Estimated Blood Loss:  Estimated blood loss was minimal. Impression:               - Diverticulosis in the left colon.                           - Four 3 to 6 mm polyps in the descending colon and                            in the transverse colon, removed with a cold snare.                            Resected and retrieved.                           - Four diminutive polyps in the rectum, removed                            with a cold snare. Resected and retrieved.                           - Internal hemorrhoids.                           - The examination was otherwise normal on direct                            and retroflexion views. Recommendation:           - Patient has a contact number available for                            emergencies. The signs and symptoms of potential                            delayed complications were discussed with the                            patient. Return to normal activities tomorrow.                            Written discharge instructions were provided to the                            patient.                           - Resume previous diet.                           - Resume Eliquis  (apixaban ) at prior dose tomorrow.                           - Await pathology results.                           - No repeat surveillance colonoscopy recommended  due to age and current guidelines. Kadelyn Dimascio L. Legrand, MD 01/17/2024 8:43:18 AM This report has been signed electronically.

## 2024-01-17 NOTE — Progress Notes (Signed)
 Pt's states no medical or surgical changes since previsit or office visit.

## 2024-01-17 NOTE — Progress Notes (Signed)
 Sedate, gd SR, tolerated procedure well, VSS, report to RN

## 2024-01-17 NOTE — Progress Notes (Signed)
 History and Physical:  This patient presents for endoscopic testing for: Encounter Diagnoses  Name Primary?   Hx of colonic polyps Yes   Altered bowel habits     78 year old man here today for surveillance colonoscopy with a history of colon polyps.  Patient is otherwise without complaints or active issues today. 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 Debrah May 2010.  A follow-up colonoscopy later that year. Last colonoscopy in 2014 with a diminutive polypoid piece of tissue that was normal.  He has incidentally had a change in his bowel habits describing a 11/28/2023 office note.  Off Eliquis  last 2 days for this procedure  Past Medical History: Past Medical History:  Diagnosis Date   BPH (benign prostatic hyperplasia)    Cataracts, bilateral    Chronic LBP    Colon polyps    Diverticulosis    ED (erectile dysfunction)    Essential hypertension, benign 1990   Microalbuminuria    Monoclonal gammopathy 04/2008   Type II or unspecified type diabetes mellitus without mention of complication, not stated as uncontrolled      Past Surgical History: Past Surgical History:  Procedure Laterality Date   CARDIOVERSION N/A 12/08/2015   Procedure: CARDIOVERSION;  Surgeon: Ezra GORMAN Shuck, MD;  Location: [facility] ENDOSCOPY;  Service: Cardiovascular;  Laterality: N/A;   COLONOSCOPY  04/2012   Kaplan-MAC-suprep(good)-benign polyp   TEE WITHOUT CARDIOVERSION N/A 12/08/2015   Procedure: TRANSESOPHAGEAL ECHOCARDIOGRAM (TEE);  Surgeon: Ezra GORMAN Shuck, MD;  Location: [facility] ENDOSCOPY;  Service: Cardiovascular;  Laterality: N/A;   TRANSURETHRAL RESECTION OF PROSTATE      Allergies: Allergies[1]  Outpatient Meds: Current Outpatient Medications  Medication Sig Dispense Refill   allopurinol (ZYLOPRIM) 100 MG tablet Take 100 mg by mouth daily.     amLODipine  (NORVASC ) 10 MG tablet take 1 tablet by mouth once  daily (Patient taking differently: Take 10 mg by mouth daily.) 90 tablet 1   atorvastatin  (LIPITOR) 40 MG tablet Take 40 mg by mouth daily.     benazepril  (LOTENSIN ) 40 MG tablet take 1 tablet by mouth once daily (Patient taking differently: Take 40 mg by mouth daily. TAKE 1 TABLET BY MOUTH ONCE DAILY.) 90 tablet 1   cetirizine (ZYRTEC) 10 MG tablet Take 10 mg by mouth daily.     ciprofloxacin (CIPRO) 500 MG tablet Take 500 mg by mouth daily.     finasteride (PROSCAR) 5 MG tablet Take 5 mg by mouth daily.     gabapentin (NEURONTIN) 300 MG capsule Take 300 mg by mouth at bedtime as needed.     hydrochlorothiazide  (HYDRODIURIL ) 25 MG tablet Take 1 tablet (25 mg total) by mouth every morning. 90 tablet 3   metFORMIN  (GLUCOPHAGE ) 1000 MG tablet take 1 tablet by mouth twice a day with food (Patient taking differently: Take 1,000 mg by mouth 2 (two) times daily with a meal.) 180 tablet 1   metoprolol  tartrate (LOPRESSOR ) 50 MG tablet take 1 tablet by mouth twice a day (Patient taking differently: Take 50 mg by mouth daily.) 180 tablet 1   tamsulosin (FLOMAX) 0.4 MG CAPS capsule Take 0.4 mg by mouth every 12 (twelve) hours.     apixaban  (ELIQUIS ) 5 MG TABS tablet Take 1 tablet (5 mg total) by mouth 2 (two) times daily. 180 tablet 3   mupirocin  ointment (BACTROBAN ) 2 % Apply to affected toe once daily. (Patient taking differently: Apply 1 Application topically daily as needed. Apply to  affected toe once daily.) 30 g 1   Current Facility-Administered Medications  Medication Dose Route Frequency Provider Last Rate Last Admin   0.9 %  sodium chloride  infusion  500 mL Intravenous Once Danis, Tanner Yeley L III, MD          ___________________________________________________________________ Objective   Exam:  BP 98/77   Pulse 91   Temp 97.7 F (36.5 C) (Skin)   Resp 19   Ht 5' 10.5 (1.791 m)   Wt 225 lb (102.1 kg)   SpO2 99%   BMI 31.83 kg/m   CV: regular , S1/S2 Resp: clear to auscultation  bilaterally, normal RR and effort noted GI: soft, no tenderness, with active bowel sounds.   Assessment: Encounter Diagnoses  Name Primary?   Hx of colonic polyps Yes   Altered bowel habits      Plan: Colonoscopy   The benefits and risks of the planned procedure(s) were described in detail with the patient or (when appropriate) their health care proxy.  Risks were outlined as including, but not limited to, bleeding, infection, perforation, adverse medication reaction leading to cardiac or pulmonary decompensation, pancreatitis (if ERCP).  The limitation of incomplete mucosal visualization was also discussed.  No guarantees or warranties were given.  The patient was provided an opportunity to ask questions and all were answered. The patient agreed with the plan.   The patient is appropriate for an endoscopic procedure in the ambulatory setting.   - Victory Brand, MD        [1] No Active Allergies

## 2024-01-20 ENCOUNTER — Telehealth: Payer: Self-pay

## 2024-01-20 NOTE — Telephone Encounter (Signed)
" °  Follow up Call-     01/17/2024    7:19 AM  Call back number  Post procedure Call Back phone  # [phone]  Permission to leave phone message Yes     Patient questions:  Do you have a fever, pain , or abdominal swelling? No. Pain Score  0 *  Have you tolerated food without any problems? Yes.    Have you been able to return to your normal activities? Yes.    Do you have any questions about your discharge instructions: Diet   No. Medications  No. Follow up visit  No.  Do you have questions or concerns about your Care? No.  Actions: * If pain score is 4 or above: No action needed, pain <4.   "

## 2024-01-21 ENCOUNTER — Ambulatory Visit: Payer: Self-pay | Admitting: Gastroenterology

## 2024-01-21 LAB — SURGICAL PATHOLOGY

## 2024-02-05 ENCOUNTER — Ambulatory Visit: Admitting: Podiatry

## 2024-02-05 ENCOUNTER — Encounter: Payer: Self-pay | Admitting: Podiatry

## 2024-02-05 DIAGNOSIS — I739 Peripheral vascular disease, unspecified: Secondary | ICD-10-CM

## 2024-02-05 DIAGNOSIS — L84 Corns and callosities: Secondary | ICD-10-CM

## 2024-02-05 DIAGNOSIS — B351 Tinea unguium: Secondary | ICD-10-CM | POA: Diagnosis not present

## 2024-02-05 DIAGNOSIS — E1122 Type 2 diabetes mellitus with diabetic chronic kidney disease: Secondary | ICD-10-CM | POA: Diagnosis not present

## 2024-02-05 DIAGNOSIS — M79609 Pain in unspecified limb: Secondary | ICD-10-CM

## 2024-02-05 DIAGNOSIS — N183 Chronic kidney disease, stage 3 unspecified: Secondary | ICD-10-CM

## 2024-02-05 NOTE — Progress Notes (Signed)
"  °  Subjective:  Patient ID: Gregg Flores, male    DOB: Mar 13, 1945,  MRN: [id number]  Gregg Flores presents to clinic today for at risk foot care. Pt has h/o NIDDM with chronic kidney disease and preulcerative lesion(s) left foot and painful mycotic toenails that limit ambulation. Painful toenails interfere with ambulation. Aggravating factors include wearing enclosed shoe gear. Pain is relieved with periodic professional debridement. Painful preulcerative lesion(s) is/are aggravated when weightbearing with and without shoegear. Pain is relieved with periodic professional debridement.  Chief Complaint  Patient presents with   [facility]    Rm15 Diabetic foot care/ Dr. Darice Henle last visit August 2025    New problem(s): None.   PCP is Henle Darice CROME, MD.  Allergies[1]  Review of Systems: Negative except as noted in the HPI.  Objective: No changes noted in today's physical examination. There were no vitals filed for this visit. Gregg Flores is a pleasant 79 y.o. male in NAD. AAO x 3.  Vascular Examination: CFT <3 seconds b/l. DP/PT pulses faintly palpable b/l. Skin temperature gradient warm to warm b/l. No pain with calf compression. No ischemia or gangrene. No cyanosis or clubbing noted b/l.    Neurological Examination: Sensation grossly intact b/l with 10 gram monofilament. Vibratory sensation intact b/l.   Dermatological Examination: Pedal skin warm and supple b/l.   No open wounds. No interdigital macerations.  Toenails 1-5 b/l well maintained with adequate length. No erythema, no edema, no drainage, no fluctuance. Preulcerative lesion noted submet head 1 left foot. There is visible subdermal hemorrhage. There is no surrounding erythema, no edema, no drainage, no odor, no fluctuance.  Musculoskeletal Examination: Muscle strength 5/5 to all lower extremity muscle groups bilaterally. HAV with bunion deformity noted b/l LE. Hammertoe(s) bilateral 2nd toes.  Radiographs:  None  Assessment/Plan: 1. Pain due to onychomycosis of nail   2. Pre-ulcerative calluses   3. PAD (peripheral artery disease)   4. Type 2 diabetes mellitus with stage 3 chronic kidney disease, without long-term current use of insulin, unspecified whether stage 3a or 3b CKD (HCC)   Consent given for treatment. Patient examined. All patient's and/or POA's questions/concerns addressed on today's visit. Mycotic toenails 1-5 b/l  debrided in length and girth without incident. Callus(es) submet head 1 left foot pared with sharp debridement without incident. Offloaded  insert of left foot. Continue foot and shoe inspections daily. Monitor blood glucose per PCP/Endocrinologist's recommendations.Continue soft, supportive shoe gear daily. Report any pedal injuries to medical professional. Call office if there are any quesitons/concerns.  Return in about 4 months (around 06/04/2024).  Delon CROME Merlin, DPM      [place] LOCATION: [address], [place] [address]                   Office [phone]   [facility] LOCATION: [address] [place], [place] [address] Office [phone]      [1] No Active Allergies  "

## 2024-06-03 ENCOUNTER — Ambulatory Visit: Admitting: Podiatry
